# Patient Record
Sex: Female | Born: 1940 | Race: White | Hispanic: No | Smoking: Never smoker
Health system: Southern US, Community
[De-identification: ages and names within clinical notes are randomized; demographics above are authoritative.]

## PROBLEM LIST (undated history)

## (undated) DIAGNOSIS — N189 Chronic kidney disease, unspecified: Secondary | ICD-10-CM

## (undated) DIAGNOSIS — R0600 Dyspnea, unspecified: Secondary | ICD-10-CM

## (undated) DIAGNOSIS — Z87442 Personal history of urinary calculi: Secondary | ICD-10-CM

## (undated) DIAGNOSIS — C50919 Malignant neoplasm of unspecified site of unspecified female breast: Secondary | ICD-10-CM

## (undated) DIAGNOSIS — G459 Transient cerebral ischemic attack, unspecified: Secondary | ICD-10-CM

## (undated) DIAGNOSIS — K219 Gastro-esophageal reflux disease without esophagitis: Secondary | ICD-10-CM

## (undated) DIAGNOSIS — Z923 Personal history of irradiation: Secondary | ICD-10-CM

## (undated) DIAGNOSIS — A419 Sepsis, unspecified organism: Secondary | ICD-10-CM

## (undated) DIAGNOSIS — M1990 Unspecified osteoarthritis, unspecified site: Secondary | ICD-10-CM

## (undated) DIAGNOSIS — R600 Localized edema: Secondary | ICD-10-CM

## (undated) DIAGNOSIS — R0609 Other forms of dyspnea: Secondary | ICD-10-CM

## (undated) DIAGNOSIS — C801 Malignant (primary) neoplasm, unspecified: Secondary | ICD-10-CM

## (undated) DIAGNOSIS — I10 Essential (primary) hypertension: Secondary | ICD-10-CM

## (undated) HISTORY — PX: RENAL ARTERY STENT: SHX2321

## (undated) HISTORY — PX: CHOLECYSTECTOMY: SHX55

## (undated) HISTORY — PX: BREAST BIOPSY: SHX20

## (undated) HISTORY — PX: CYSTOSCOPY: SUR368

---

## 1997-09-04 ENCOUNTER — Other Ambulatory Visit: Admission: RE | Admit: 1997-09-04 | Discharge: 1997-09-04 | Payer: Self-pay | Admitting: Obstetrics and Gynecology

## 1998-10-01 ENCOUNTER — Other Ambulatory Visit: Admission: RE | Admit: 1998-10-01 | Discharge: 1998-10-01 | Payer: Self-pay | Admitting: Obstetrics and Gynecology

## 1999-11-04 ENCOUNTER — Other Ambulatory Visit: Admission: RE | Admit: 1999-11-04 | Discharge: 1999-11-04 | Payer: Self-pay | Admitting: Obstetrics and Gynecology

## 2000-04-13 DIAGNOSIS — Z923 Personal history of irradiation: Secondary | ICD-10-CM

## 2000-04-13 HISTORY — DX: Personal history of irradiation: Z92.3

## 2000-04-13 HISTORY — PX: BREAST LUMPECTOMY: SHX2

## 2000-09-15 ENCOUNTER — Encounter: Payer: Self-pay | Admitting: Obstetrics and Gynecology

## 2000-09-15 ENCOUNTER — Encounter: Admission: RE | Admit: 2000-09-15 | Discharge: 2000-09-15 | Payer: Self-pay | Admitting: Obstetrics and Gynecology

## 2000-09-20 ENCOUNTER — Encounter: Admission: RE | Admit: 2000-09-20 | Discharge: 2000-09-20 | Payer: Self-pay | Admitting: Obstetrics and Gynecology

## 2000-09-20 ENCOUNTER — Encounter (INDEPENDENT_AMBULATORY_CARE_PROVIDER_SITE_OTHER): Payer: Self-pay | Admitting: *Deleted

## 2000-09-20 ENCOUNTER — Encounter: Payer: Self-pay | Admitting: Obstetrics and Gynecology

## 2000-09-20 ENCOUNTER — Other Ambulatory Visit: Admission: RE | Admit: 2000-09-20 | Discharge: 2000-09-20 | Payer: Self-pay | Admitting: Obstetrics and Gynecology

## 2000-10-05 ENCOUNTER — Encounter: Payer: Self-pay | Admitting: General Surgery

## 2000-10-05 ENCOUNTER — Encounter: Admission: RE | Admit: 2000-10-05 | Discharge: 2000-10-05 | Payer: Self-pay | Admitting: General Surgery

## 2000-10-06 ENCOUNTER — Encounter (INDEPENDENT_AMBULATORY_CARE_PROVIDER_SITE_OTHER): Payer: Self-pay | Admitting: Specialist

## 2000-10-06 ENCOUNTER — Ambulatory Visit (HOSPITAL_BASED_OUTPATIENT_CLINIC_OR_DEPARTMENT_OTHER): Admission: RE | Admit: 2000-10-06 | Discharge: 2000-10-06 | Payer: Self-pay | Admitting: General Surgery

## 2000-10-06 ENCOUNTER — Encounter: Payer: Self-pay | Admitting: General Surgery

## 2000-10-06 ENCOUNTER — Encounter: Admission: RE | Admit: 2000-10-06 | Discharge: 2000-10-06 | Payer: Self-pay | Admitting: General Surgery

## 2000-10-15 ENCOUNTER — Ambulatory Visit: Admission: RE | Admit: 2000-10-15 | Discharge: 2001-01-13 | Payer: Self-pay | Admitting: Radiation Oncology

## 2001-02-08 ENCOUNTER — Ambulatory Visit: Admission: RE | Admit: 2001-02-08 | Discharge: 2001-05-09 | Payer: Self-pay | Admitting: Radiation Oncology

## 2001-02-21 ENCOUNTER — Other Ambulatory Visit: Admission: RE | Admit: 2001-02-21 | Discharge: 2001-02-21 | Payer: Self-pay

## 2001-03-29 ENCOUNTER — Encounter: Admission: RE | Admit: 2001-03-29 | Discharge: 2001-03-29 | Payer: Self-pay | Admitting: General Surgery

## 2001-03-29 ENCOUNTER — Encounter: Payer: Self-pay | Admitting: General Surgery

## 2001-09-27 ENCOUNTER — Encounter: Payer: Self-pay | Admitting: General Surgery

## 2001-09-27 ENCOUNTER — Encounter: Admission: RE | Admit: 2001-09-27 | Discharge: 2001-09-27 | Payer: Self-pay | Admitting: General Surgery

## 2002-03-14 ENCOUNTER — Other Ambulatory Visit: Admission: RE | Admit: 2002-03-14 | Discharge: 2002-03-14 | Payer: Self-pay | Admitting: Obstetrics and Gynecology

## 2002-10-02 ENCOUNTER — Encounter: Payer: Self-pay | Admitting: General Surgery

## 2002-10-02 ENCOUNTER — Encounter: Admission: RE | Admit: 2002-10-02 | Discharge: 2002-10-02 | Payer: Self-pay | Admitting: General Surgery

## 2003-03-20 ENCOUNTER — Other Ambulatory Visit: Admission: RE | Admit: 2003-03-20 | Discharge: 2003-03-20 | Payer: Self-pay | Admitting: Obstetrics and Gynecology

## 2003-10-04 ENCOUNTER — Encounter: Admission: RE | Admit: 2003-10-04 | Discharge: 2003-10-04 | Payer: Self-pay | Admitting: General Surgery

## 2003-10-10 ENCOUNTER — Ambulatory Visit (HOSPITAL_COMMUNITY): Admission: RE | Admit: 2003-10-10 | Discharge: 2003-10-10 | Payer: Self-pay | Admitting: Gastroenterology

## 2005-01-13 ENCOUNTER — Encounter: Admission: RE | Admit: 2005-01-13 | Discharge: 2005-01-13 | Payer: Self-pay | Admitting: Obstetrics and Gynecology

## 2005-01-14 ENCOUNTER — Other Ambulatory Visit: Admission: RE | Admit: 2005-01-14 | Discharge: 2005-01-14 | Payer: Self-pay | Admitting: Obstetrics and Gynecology

## 2005-02-09 ENCOUNTER — Emergency Department (HOSPITAL_COMMUNITY): Admission: EM | Admit: 2005-02-09 | Discharge: 2005-02-09 | Payer: Self-pay | Admitting: Emergency Medicine

## 2006-02-23 ENCOUNTER — Encounter: Admission: RE | Admit: 2006-02-23 | Discharge: 2006-02-23 | Payer: Self-pay | Admitting: Obstetrics and Gynecology

## 2007-03-30 ENCOUNTER — Encounter: Admission: RE | Admit: 2007-03-30 | Discharge: 2007-03-30 | Payer: Self-pay | Admitting: Obstetrics and Gynecology

## 2007-04-11 ENCOUNTER — Encounter: Admission: RE | Admit: 2007-04-11 | Discharge: 2007-04-11 | Payer: Self-pay | Admitting: Obstetrics and Gynecology

## 2008-04-23 ENCOUNTER — Encounter: Admission: RE | Admit: 2008-04-23 | Discharge: 2008-04-23 | Payer: Self-pay | Admitting: Obstetrics and Gynecology

## 2009-05-15 ENCOUNTER — Encounter: Admission: RE | Admit: 2009-05-15 | Discharge: 2009-05-15 | Payer: Self-pay | Admitting: Obstetrics and Gynecology

## 2010-08-29 NOTE — Op Note (Signed)
NAME:  Rebecca Porter, Rebecca Porter                        ACCOUNT NO.:  [id number]   MEDICAL RECORD NO.:  [id number]                   PATIENT TYPE:  AMB   LOCATION:  ENDO                                 FACILITY:  [facility]   PHYSICIAN:  Bernette Redbird, M.D.                DATE OF BIRTH:  Mar 18, 1941   DATE OF PROCEDURE:  10/10/2003  DATE OF DISCHARGE:                                 OPERATIVE REPORT   PROCEDURE:  Colonoscopy.   INDICATIONS FOR PROCEDURE:  Family history of colon cancer and prior history  of adenomatous polyp in this 70 year old female last colonoscoped by me  about seven years ago at which time a small adenoma was removed.   FINDINGS:  Normal examination to the cecum.   DESCRIPTION OF PROCEDURE:  The nature, purpose, and risks of the procedure  were familiar to the patient from prior examination and she provided written  consent.  Sedation was Fentanyl 80 mcg and Versed 8 mg IV without  arrhythmias or desaturation.  The Olympus adult video colonoscope was  advanced with some looping, overcome by having the patient in the supine  position and also using some external abdominal compression.  The cecum was  identified by clear visualization of the appendiceal orifice and pullback  was then performed.  The quality of the prep was excellent and it was felt  that all areas were well seen.   This was a normal examination.  No polyps, cancer, colitis, vascular  malformations, or diverticulosis was noted other than some very minimal  diverticular orifices in the sigmoid region.  Retroflexion in the rectum and  reinspection of the rectosigmoid was unremarkable.  No biopsies were  obtained. The patient tolerated the procedure well with no complications.   IMPRESSION:  Normal colonoscopy in a patient with a prior history of colon  polyps and a family history of colon cancer (V12.72).   PLAN:  Follow-up colonoscopy in five years.                                               Bernette Redbird, M.D.    RB/MEDQ  D:  10/10/2003  T:  10/10/2003  Job:  [id number]   cc:   L. Lupe Carney, M.D.  [address] [place]  [place] [address]  Fax: [phone]

## 2010-08-29 NOTE — Op Note (Signed)
[facility]  Patient:    Rebecca Porter, Rebecca Porter                      MRN: [id number] Adm. Date:  10/06/00 Attending:  Rose Phi. Maple Hudson, M.D. CC:         Juluis Mire, M.D.   Operative Report  PREOPERATIVE DIAGNOSIS:  Ductal carcinoma in situ of the left breast.  POSTOPERATIVE DIAGNOSIS:  Ductal carcinoma in situ of the left breast.  PROCEDURE:  Left partial mastectomy with needle localization and specimen mammography.  SURGEON:  Rose Phi. Maple Hudson, M.D.  ANESTHESIA:  General.  CLINICAL NOTE:  This patient had had a previously identified abnormality in the 12 oclock position of the left breast with microcalcifications.  Needle core biopsy showed high-grade DCIS, and she is scheduled now for a wide excision.  DESCRIPTION OF PROCEDURE:  Prior to coming to the operating room, a wire had been placed to identify the abnormal area.  After suitable general anesthesia was induced, the patient was placed in the supine position and the left breast prepped and draped in the usual fashion. A curvilinear incision was outlined around the previously-placed wire and an incision made and a wide excision carried out.  Specimen mammography confirmed the removal of the area with clip, and the specimen, which had been oriented for the pathologist, was submitted for touch prep evaluation for margins.  While that was being done, hemostasis obtained with the cautery.  Subcuticular closure of 4-0 Monocryl and Steri-Strips was carried out.  Touch prep on the margins showed them to be negative.  A dressing was applied and the patient transferred to the recovery room in satisfactory condition, having tolerated the procedure well. DD:  10/06/00 TD:  10/06/00 Job: [id number]

## 2010-09-18 ENCOUNTER — Other Ambulatory Visit: Payer: Self-pay | Admitting: Obstetrics and Gynecology

## 2010-09-18 DIAGNOSIS — N644 Mastodynia: Secondary | ICD-10-CM

## 2010-09-25 ENCOUNTER — Other Ambulatory Visit: Payer: Self-pay

## 2010-09-30 ENCOUNTER — Ambulatory Visit
Admission: RE | Admit: 2010-09-30 | Discharge: 2010-09-30 | Disposition: A | Discharge status: Home / Self Care | Payer: Medicare Other | Source: Ambulatory Visit | Attending: Obstetrics and Gynecology | Admitting: Obstetrics and Gynecology

## 2010-09-30 DIAGNOSIS — N644 Mastodynia: Secondary | ICD-10-CM

## 2012-06-24 ENCOUNTER — Other Ambulatory Visit: Payer: Self-pay

## 2012-06-24 DIAGNOSIS — Z1231 Encounter for screening mammogram for malignant neoplasm of breast: Secondary | ICD-10-CM

## 2012-07-28 ENCOUNTER — Ambulatory Visit
Admission: RE | Admit: 2012-07-28 | Discharge: 2012-07-28 | Disposition: A | Discharge status: Home / Self Care | Payer: Medicare Other | Source: Ambulatory Visit

## 2012-07-28 DIAGNOSIS — Z1231 Encounter for screening mammogram for malignant neoplasm of breast: Secondary | ICD-10-CM

## 2014-01-18 ENCOUNTER — Other Ambulatory Visit: Payer: Self-pay

## 2014-01-18 DIAGNOSIS — Z1231 Encounter for screening mammogram for malignant neoplasm of breast: Secondary | ICD-10-CM

## 2014-02-01 ENCOUNTER — Encounter (INDEPENDENT_AMBULATORY_CARE_PROVIDER_SITE_OTHER): Payer: Self-pay

## 2014-02-01 ENCOUNTER — Ambulatory Visit
Admission: RE | Admit: 2014-02-01 | Discharge: 2014-02-01 | Disposition: A | Discharge status: Home / Self Care | Payer: Medicare Other | Source: Ambulatory Visit

## 2014-02-01 DIAGNOSIS — Z1231 Encounter for screening mammogram for malignant neoplasm of breast: Secondary | ICD-10-CM

## 2014-08-17 ENCOUNTER — Other Ambulatory Visit: Payer: Self-pay | Admitting: Gastroenterology

## 2014-08-17 DIAGNOSIS — R1310 Dysphagia, unspecified: Secondary | ICD-10-CM

## 2014-08-30 ENCOUNTER — Ambulatory Visit
Admission: RE | Admit: 2014-08-30 | Discharge: 2014-08-30 | Disposition: A | Discharge status: Home / Self Care | Payer: Medicare Other | Source: Ambulatory Visit | Attending: Gastroenterology | Admitting: Gastroenterology

## 2014-08-30 DIAGNOSIS — R1310 Dysphagia, unspecified: Secondary | ICD-10-CM

## 2015-06-11 ENCOUNTER — Other Ambulatory Visit: Payer: Self-pay

## 2015-06-11 DIAGNOSIS — Z853 Personal history of malignant neoplasm of breast: Secondary | ICD-10-CM

## 2015-06-12 ENCOUNTER — Ambulatory Visit
Admission: RE | Admit: 2015-06-12 | Discharge: 2015-06-12 | Disposition: A | Discharge status: Home / Self Care | Payer: Medicare Other | Source: Ambulatory Visit

## 2015-06-12 DIAGNOSIS — Z853 Personal history of malignant neoplasm of breast: Secondary | ICD-10-CM

## 2016-07-23 ENCOUNTER — Other Ambulatory Visit: Payer: Self-pay | Admitting: Obstetrics and Gynecology

## 2016-07-23 DIAGNOSIS — Z1231 Encounter for screening mammogram for malignant neoplasm of breast: Secondary | ICD-10-CM

## 2016-08-20 ENCOUNTER — Ambulatory Visit
Admission: RE | Admit: 2016-08-20 | Discharge: 2016-08-20 | Disposition: A | Discharge status: Home / Self Care | Payer: Medicare Other | Source: Ambulatory Visit | Attending: Obstetrics and Gynecology | Admitting: Obstetrics and Gynecology

## 2016-08-20 DIAGNOSIS — Z1231 Encounter for screening mammogram for malignant neoplasm of breast: Secondary | ICD-10-CM

## 2016-08-20 HISTORY — DX: Personal history of irradiation: Z92.3

## 2016-09-10 DIAGNOSIS — Z6838 Body mass index (BMI) 38.0-38.9, adult: Secondary | ICD-10-CM | POA: Diagnosis not present

## 2016-09-10 DIAGNOSIS — Z01419 Encounter for gynecological examination (general) (routine) without abnormal findings: Secondary | ICD-10-CM | POA: Diagnosis not present

## 2016-09-10 DIAGNOSIS — N3281 Overactive bladder: Secondary | ICD-10-CM | POA: Diagnosis not present

## 2016-09-10 DIAGNOSIS — K118 Other diseases of salivary glands: Secondary | ICD-10-CM | POA: Diagnosis not present

## 2016-12-03 DIAGNOSIS — M25561 Pain in right knee: Secondary | ICD-10-CM | POA: Diagnosis not present

## 2016-12-03 DIAGNOSIS — G8929 Other chronic pain: Secondary | ICD-10-CM | POA: Diagnosis not present

## 2016-12-10 DIAGNOSIS — M25561 Pain in right knee: Secondary | ICD-10-CM | POA: Diagnosis not present

## 2016-12-10 DIAGNOSIS — G8929 Other chronic pain: Secondary | ICD-10-CM | POA: Diagnosis not present

## 2016-12-24 DIAGNOSIS — M25561 Pain in right knee: Secondary | ICD-10-CM | POA: Diagnosis not present

## 2016-12-24 DIAGNOSIS — G8929 Other chronic pain: Secondary | ICD-10-CM | POA: Diagnosis not present

## 2016-12-28 DIAGNOSIS — Z13220 Encounter for screening for lipoid disorders: Secondary | ICD-10-CM | POA: Diagnosis not present

## 2016-12-28 DIAGNOSIS — Z0000 Encounter for general adult medical examination without abnormal findings: Secondary | ICD-10-CM | POA: Diagnosis not present

## 2016-12-28 DIAGNOSIS — E559 Vitamin D deficiency, unspecified: Secondary | ICD-10-CM | POA: Diagnosis not present

## 2016-12-28 DIAGNOSIS — R799 Abnormal finding of blood chemistry, unspecified: Secondary | ICD-10-CM | POA: Diagnosis not present

## 2016-12-28 DIAGNOSIS — Z136 Encounter for screening for cardiovascular disorders: Secondary | ICD-10-CM | POA: Diagnosis not present

## 2016-12-28 DIAGNOSIS — Z1329 Encounter for screening for other suspected endocrine disorder: Secondary | ICD-10-CM | POA: Diagnosis not present

## 2016-12-28 DIAGNOSIS — N3941 Urge incontinence: Secondary | ICD-10-CM | POA: Diagnosis not present

## 2016-12-28 DIAGNOSIS — J302 Other seasonal allergic rhinitis: Secondary | ICD-10-CM | POA: Diagnosis not present

## 2016-12-28 DIAGNOSIS — R350 Frequency of micturition: Secondary | ICD-10-CM | POA: Diagnosis not present

## 2016-12-28 DIAGNOSIS — Z23 Encounter for immunization: Secondary | ICD-10-CM | POA: Diagnosis not present

## 2017-01-29 DIAGNOSIS — M25561 Pain in right knee: Secondary | ICD-10-CM | POA: Diagnosis not present

## 2017-01-29 DIAGNOSIS — G8929 Other chronic pain: Secondary | ICD-10-CM | POA: Diagnosis not present

## 2017-04-15 DIAGNOSIS — M1711 Unilateral primary osteoarthritis, right knee: Secondary | ICD-10-CM | POA: Diagnosis not present

## 2017-07-16 DIAGNOSIS — M1711 Unilateral primary osteoarthritis, right knee: Secondary | ICD-10-CM | POA: Insufficient documentation

## 2017-07-16 DIAGNOSIS — M179 Osteoarthritis of knee, unspecified: Secondary | ICD-10-CM | POA: Insufficient documentation

## 2017-07-23 ENCOUNTER — Other Ambulatory Visit: Payer: Self-pay | Admitting: Obstetrics and Gynecology

## 2017-07-23 DIAGNOSIS — Z139 Encounter for screening, unspecified: Secondary | ICD-10-CM

## 2017-08-23 ENCOUNTER — Ambulatory Visit
Admission: RE | Admit: 2017-08-23 | Discharge: 2017-08-23 | Disposition: A | Discharge status: Home / Self Care | Payer: Medicare Other | Source: Ambulatory Visit | Attending: Obstetrics and Gynecology | Admitting: Obstetrics and Gynecology

## 2017-08-23 DIAGNOSIS — Z139 Encounter for screening, unspecified: Secondary | ICD-10-CM

## 2017-08-23 DIAGNOSIS — Z1231 Encounter for screening mammogram for malignant neoplasm of breast: Secondary | ICD-10-CM | POA: Diagnosis not present

## 2017-09-24 DIAGNOSIS — M1711 Unilateral primary osteoarthritis, right knee: Secondary | ICD-10-CM | POA: Diagnosis not present

## 2017-09-29 DIAGNOSIS — Z124 Encounter for screening for malignant neoplasm of cervix: Secondary | ICD-10-CM | POA: Diagnosis not present

## 2017-09-29 DIAGNOSIS — Z6835 Body mass index (BMI) 35.0-35.9, adult: Secondary | ICD-10-CM | POA: Diagnosis not present

## 2017-10-04 DIAGNOSIS — I10 Essential (primary) hypertension: Secondary | ICD-10-CM | POA: Diagnosis not present

## 2017-10-18 DIAGNOSIS — I10 Essential (primary) hypertension: Secondary | ICD-10-CM | POA: Diagnosis not present

## 2017-11-06 DIAGNOSIS — D72829 Elevated white blood cell count, unspecified: Secondary | ICD-10-CM | POA: Diagnosis not present

## 2017-11-06 DIAGNOSIS — E860 Dehydration: Secondary | ICD-10-CM | POA: Diagnosis present

## 2017-11-06 DIAGNOSIS — D696 Thrombocytopenia, unspecified: Secondary | ICD-10-CM | POA: Diagnosis present

## 2017-11-06 DIAGNOSIS — M47896 Other spondylosis, lumbar region: Secondary | ICD-10-CM | POA: Diagnosis not present

## 2017-11-06 DIAGNOSIS — N170 Acute kidney failure with tubular necrosis: Secondary | ICD-10-CM | POA: Diagnosis not present

## 2017-11-06 DIAGNOSIS — M4310 Spondylolisthesis, site unspecified: Secondary | ICD-10-CM | POA: Insufficient documentation

## 2017-11-06 DIAGNOSIS — R945 Abnormal results of liver function studies: Secondary | ICD-10-CM | POA: Diagnosis not present

## 2017-11-06 DIAGNOSIS — N136 Pyonephrosis: Secondary | ICD-10-CM | POA: Diagnosis present

## 2017-11-06 DIAGNOSIS — E569 Vitamin deficiency, unspecified: Secondary | ICD-10-CM | POA: Diagnosis not present

## 2017-11-06 DIAGNOSIS — A419 Sepsis, unspecified organism: Secondary | ICD-10-CM | POA: Diagnosis not present

## 2017-11-06 DIAGNOSIS — N3001 Acute cystitis with hematuria: Secondary | ICD-10-CM | POA: Diagnosis not present

## 2017-11-06 DIAGNOSIS — N1330 Unspecified hydronephrosis: Secondary | ICD-10-CM | POA: Diagnosis not present

## 2017-11-06 DIAGNOSIS — M5136 Other intervertebral disc degeneration, lumbar region: Secondary | ICD-10-CM | POA: Diagnosis not present

## 2017-11-06 DIAGNOSIS — F329 Major depressive disorder, single episode, unspecified: Secondary | ICD-10-CM | POA: Diagnosis present

## 2017-11-06 DIAGNOSIS — Z792 Long term (current) use of antibiotics: Secondary | ICD-10-CM | POA: Diagnosis not present

## 2017-11-06 DIAGNOSIS — D649 Anemia, unspecified: Secondary | ICD-10-CM | POA: Diagnosis not present

## 2017-11-06 DIAGNOSIS — Z1612 Extended spectrum beta lactamase (ESBL) resistance: Secondary | ICD-10-CM | POA: Diagnosis not present

## 2017-11-06 DIAGNOSIS — M4306 Spondylolysis, lumbar region: Secondary | ICD-10-CM | POA: Diagnosis present

## 2017-11-06 DIAGNOSIS — R112 Nausea with vomiting, unspecified: Secondary | ICD-10-CM | POA: Diagnosis not present

## 2017-11-06 DIAGNOSIS — Z743 Need for continuous supervision: Secondary | ICD-10-CM | POA: Diagnosis not present

## 2017-11-06 DIAGNOSIS — M4316 Spondylolisthesis, lumbar region: Secondary | ICD-10-CM | POA: Diagnosis not present

## 2017-11-06 DIAGNOSIS — M4300 Spondylolysis, site unspecified: Secondary | ICD-10-CM | POA: Insufficient documentation

## 2017-11-06 DIAGNOSIS — E872 Acidosis: Secondary | ICD-10-CM | POA: Diagnosis present

## 2017-11-06 DIAGNOSIS — N3000 Acute cystitis without hematuria: Secondary | ICD-10-CM | POA: Diagnosis not present

## 2017-11-06 DIAGNOSIS — R509 Fever, unspecified: Secondary | ICD-10-CM | POA: Diagnosis not present

## 2017-11-06 DIAGNOSIS — Z79899 Other long term (current) drug therapy: Secondary | ICD-10-CM | POA: Diagnosis not present

## 2017-11-06 DIAGNOSIS — R918 Other nonspecific abnormal finding of lung field: Secondary | ICD-10-CM | POA: Diagnosis not present

## 2017-11-06 DIAGNOSIS — R52 Pain, unspecified: Secondary | ICD-10-CM | POA: Diagnosis not present

## 2017-11-06 DIAGNOSIS — N2889 Other specified disorders of kidney and ureter: Secondary | ICD-10-CM | POA: Diagnosis not present

## 2017-11-06 DIAGNOSIS — A4150 Gram-negative sepsis, unspecified: Secondary | ICD-10-CM | POA: Diagnosis not present

## 2017-11-06 DIAGNOSIS — Z880 Allergy status to penicillin: Secondary | ICD-10-CM | POA: Diagnosis not present

## 2017-11-06 DIAGNOSIS — Z889 Allergy status to unspecified drugs, medicaments and biological substances status: Secondary | ICD-10-CM | POA: Diagnosis not present

## 2017-11-06 DIAGNOSIS — R630 Anorexia: Secondary | ICD-10-CM | POA: Diagnosis present

## 2017-11-06 DIAGNOSIS — T7840XS Allergy, unspecified, sequela: Secondary | ICD-10-CM | POA: Diagnosis not present

## 2017-11-06 DIAGNOSIS — Z888 Allergy status to other drugs, medicaments and biological substances status: Secondary | ICD-10-CM | POA: Diagnosis not present

## 2017-11-06 DIAGNOSIS — Z6836 Body mass index (BMI) 36.0-36.9, adult: Secondary | ICD-10-CM | POA: Diagnosis not present

## 2017-11-06 DIAGNOSIS — J90 Pleural effusion, not elsewhere classified: Secondary | ICD-10-CM | POA: Diagnosis not present

## 2017-11-06 DIAGNOSIS — N201 Calculus of ureter: Secondary | ICD-10-CM | POA: Diagnosis not present

## 2017-11-06 DIAGNOSIS — Z111 Encounter for screening for respiratory tuberculosis: Secondary | ICD-10-CM | POA: Diagnosis not present

## 2017-11-06 DIAGNOSIS — F509 Eating disorder, unspecified: Secondary | ICD-10-CM | POA: Diagnosis not present

## 2017-11-06 DIAGNOSIS — N200 Calculus of kidney: Secondary | ICD-10-CM | POA: Diagnosis not present

## 2017-11-06 DIAGNOSIS — A4151 Sepsis due to Escherichia coli [E. coli]: Secondary | ICD-10-CM | POA: Diagnosis present

## 2017-11-06 DIAGNOSIS — E669 Obesity, unspecified: Secondary | ICD-10-CM | POA: Diagnosis present

## 2017-11-06 DIAGNOSIS — R030 Elevated blood-pressure reading, without diagnosis of hypertension: Secondary | ICD-10-CM | POA: Diagnosis not present

## 2017-11-06 DIAGNOSIS — N202 Calculus of kidney with calculus of ureter: Secondary | ICD-10-CM | POA: Diagnosis not present

## 2017-11-06 DIAGNOSIS — I517 Cardiomegaly: Secondary | ICD-10-CM | POA: Diagnosis not present

## 2017-11-06 DIAGNOSIS — N390 Urinary tract infection, site not specified: Secondary | ICD-10-CM | POA: Insufficient documentation

## 2017-11-06 DIAGNOSIS — N3281 Overactive bladder: Secondary | ICD-10-CM | POA: Diagnosis present

## 2017-11-06 DIAGNOSIS — K5900 Constipation, unspecified: Secondary | ICD-10-CM | POA: Diagnosis not present

## 2017-11-06 DIAGNOSIS — M6281 Muscle weakness (generalized): Secondary | ICD-10-CM | POA: Diagnosis not present

## 2017-11-06 DIAGNOSIS — N132 Hydronephrosis with renal and ureteral calculous obstruction: Secondary | ICD-10-CM | POA: Diagnosis not present

## 2017-11-06 DIAGNOSIS — R6521 Severe sepsis with septic shock: Secondary | ICD-10-CM | POA: Diagnosis present

## 2017-11-06 DIAGNOSIS — R7989 Other specified abnormal findings of blood chemistry: Secondary | ICD-10-CM | POA: Insufficient documentation

## 2017-11-06 DIAGNOSIS — F39 Unspecified mood [affective] disorder: Secondary | ICD-10-CM | POA: Diagnosis not present

## 2017-11-06 DIAGNOSIS — Z466 Encounter for fitting and adjustment of urinary device: Secondary | ICD-10-CM | POA: Diagnosis not present

## 2017-11-06 DIAGNOSIS — I700 Atherosclerosis of aorta: Secondary | ICD-10-CM | POA: Diagnosis not present

## 2017-11-06 DIAGNOSIS — Z9049 Acquired absence of other specified parts of digestive tract: Secondary | ICD-10-CM | POA: Diagnosis not present

## 2017-11-06 DIAGNOSIS — R2689 Other abnormalities of gait and mobility: Secondary | ICD-10-CM | POA: Diagnosis not present

## 2017-11-06 DIAGNOSIS — N179 Acute kidney failure, unspecified: Secondary | ICD-10-CM | POA: Diagnosis present

## 2017-11-06 DIAGNOSIS — Z881 Allergy status to other antibiotic agents status: Secondary | ICD-10-CM | POA: Diagnosis not present

## 2017-11-07 DIAGNOSIS — D696 Thrombocytopenia, unspecified: Secondary | ICD-10-CM | POA: Insufficient documentation

## 2017-11-12 DIAGNOSIS — R52 Pain, unspecified: Secondary | ICD-10-CM | POA: Diagnosis not present

## 2017-11-12 DIAGNOSIS — M5489 Other dorsalgia: Secondary | ICD-10-CM | POA: Diagnosis not present

## 2017-11-12 DIAGNOSIS — R54 Age-related physical debility: Secondary | ICD-10-CM | POA: Diagnosis not present

## 2017-11-12 DIAGNOSIS — Z880 Allergy status to penicillin: Secondary | ICD-10-CM | POA: Diagnosis not present

## 2017-11-12 DIAGNOSIS — D649 Anemia, unspecified: Secondary | ICD-10-CM | POA: Diagnosis present

## 2017-11-12 DIAGNOSIS — Z853 Personal history of malignant neoplasm of breast: Secondary | ICD-10-CM | POA: Diagnosis not present

## 2017-11-12 DIAGNOSIS — F4323 Adjustment disorder with mixed anxiety and depressed mood: Secondary | ICD-10-CM | POA: Diagnosis not present

## 2017-11-12 DIAGNOSIS — Z743 Need for continuous supervision: Secondary | ICD-10-CM | POA: Diagnosis not present

## 2017-11-12 DIAGNOSIS — R7881 Bacteremia: Secondary | ICD-10-CM | POA: Diagnosis not present

## 2017-11-12 DIAGNOSIS — Z881 Allergy status to other antibiotic agents status: Secondary | ICD-10-CM | POA: Diagnosis not present

## 2017-11-12 DIAGNOSIS — D72829 Elevated white blood cell count, unspecified: Secondary | ICD-10-CM | POA: Diagnosis not present

## 2017-11-12 DIAGNOSIS — N181 Chronic kidney disease, stage 1: Secondary | ICD-10-CM | POA: Diagnosis present

## 2017-11-12 DIAGNOSIS — Z87440 Personal history of urinary (tract) infections: Secondary | ICD-10-CM | POA: Diagnosis not present

## 2017-11-12 DIAGNOSIS — N390 Urinary tract infection, site not specified: Secondary | ICD-10-CM | POA: Diagnosis not present

## 2017-11-12 DIAGNOSIS — N3281 Overactive bladder: Secondary | ICD-10-CM | POA: Diagnosis not present

## 2017-11-12 DIAGNOSIS — E569 Vitamin deficiency, unspecified: Secondary | ICD-10-CM | POA: Diagnosis not present

## 2017-11-12 DIAGNOSIS — R32 Unspecified urinary incontinence: Secondary | ICD-10-CM | POA: Diagnosis present

## 2017-11-12 DIAGNOSIS — T7840XS Allergy, unspecified, sequela: Secondary | ICD-10-CM | POA: Diagnosis not present

## 2017-11-12 DIAGNOSIS — N200 Calculus of kidney: Secondary | ICD-10-CM | POA: Diagnosis not present

## 2017-11-12 DIAGNOSIS — N179 Acute kidney failure, unspecified: Secondary | ICD-10-CM | POA: Diagnosis not present

## 2017-11-12 DIAGNOSIS — F39 Unspecified mood [affective] disorder: Secondary | ICD-10-CM | POA: Diagnosis not present

## 2017-11-12 DIAGNOSIS — M4647 Discitis, unspecified, lumbosacral region: Secondary | ICD-10-CM | POA: Diagnosis present

## 2017-11-12 DIAGNOSIS — M4310 Spondylolisthesis, site unspecified: Secondary | ICD-10-CM | POA: Diagnosis not present

## 2017-11-12 DIAGNOSIS — B356 Tinea cruris: Secondary | ICD-10-CM | POA: Diagnosis not present

## 2017-11-12 DIAGNOSIS — N209 Urinary calculus, unspecified: Secondary | ICD-10-CM | POA: Diagnosis not present

## 2017-11-12 DIAGNOSIS — M6281 Muscle weakness (generalized): Secondary | ICD-10-CM | POA: Diagnosis not present

## 2017-11-12 DIAGNOSIS — G062 Extradural and subdural abscess, unspecified: Secondary | ICD-10-CM | POA: Diagnosis not present

## 2017-11-12 DIAGNOSIS — M545 Low back pain: Secondary | ICD-10-CM | POA: Diagnosis not present

## 2017-11-12 DIAGNOSIS — R630 Anorexia: Secondary | ICD-10-CM | POA: Diagnosis not present

## 2017-11-12 DIAGNOSIS — R918 Other nonspecific abnormal finding of lung field: Secondary | ICD-10-CM | POA: Diagnosis not present

## 2017-11-12 DIAGNOSIS — R945 Abnormal results of liver function studies: Secondary | ICD-10-CM | POA: Diagnosis not present

## 2017-11-12 DIAGNOSIS — G061 Intraspinal abscess and granuloma: Secondary | ICD-10-CM | POA: Diagnosis present

## 2017-11-12 DIAGNOSIS — F509 Eating disorder, unspecified: Secondary | ICD-10-CM | POA: Diagnosis not present

## 2017-11-12 DIAGNOSIS — Z889 Allergy status to unspecified drugs, medicaments and biological substances status: Secondary | ICD-10-CM | POA: Diagnosis not present

## 2017-11-12 DIAGNOSIS — R319 Hematuria, unspecified: Secondary | ICD-10-CM | POA: Diagnosis present

## 2017-11-12 DIAGNOSIS — Z888 Allergy status to other drugs, medicaments and biological substances status: Secondary | ICD-10-CM | POA: Diagnosis not present

## 2017-11-12 DIAGNOSIS — E669 Obesity, unspecified: Secondary | ICD-10-CM | POA: Diagnosis present

## 2017-11-12 DIAGNOSIS — M4316 Spondylolisthesis, lumbar region: Secondary | ICD-10-CM | POA: Diagnosis not present

## 2017-11-12 DIAGNOSIS — A419 Sepsis, unspecified organism: Secondary | ICD-10-CM | POA: Diagnosis not present

## 2017-11-12 DIAGNOSIS — M4626 Osteomyelitis of vertebra, lumbar region: Secondary | ICD-10-CM | POA: Diagnosis not present

## 2017-11-12 DIAGNOSIS — R112 Nausea with vomiting, unspecified: Secondary | ICD-10-CM | POA: Diagnosis not present

## 2017-11-12 DIAGNOSIS — Z95828 Presence of other vascular implants and grafts: Secondary | ICD-10-CM | POA: Diagnosis not present

## 2017-11-12 DIAGNOSIS — R509 Fever, unspecified: Secondary | ICD-10-CM | POA: Diagnosis not present

## 2017-11-12 DIAGNOSIS — R2689 Other abnormalities of gait and mobility: Secondary | ICD-10-CM | POA: Diagnosis not present

## 2017-11-12 DIAGNOSIS — F320 Major depressive disorder, single episode, mild: Secondary | ICD-10-CM | POA: Diagnosis not present

## 2017-11-12 DIAGNOSIS — M5136 Other intervertebral disc degeneration, lumbar region: Secondary | ICD-10-CM | POA: Diagnosis not present

## 2017-11-12 DIAGNOSIS — D696 Thrombocytopenia, unspecified: Secondary | ICD-10-CM | POA: Diagnosis not present

## 2017-11-12 DIAGNOSIS — K5900 Constipation, unspecified: Secondary | ICD-10-CM | POA: Diagnosis present

## 2017-11-12 DIAGNOSIS — R030 Elevated blood-pressure reading, without diagnosis of hypertension: Secondary | ICD-10-CM | POA: Diagnosis not present

## 2017-11-12 DIAGNOSIS — B373 Candidiasis of vulva and vagina: Secondary | ICD-10-CM | POA: Diagnosis not present

## 2017-11-12 DIAGNOSIS — F319 Bipolar disorder, unspecified: Secondary | ICD-10-CM | POA: Diagnosis present

## 2017-11-12 DIAGNOSIS — Z111 Encounter for screening for respiratory tuberculosis: Secondary | ICD-10-CM | POA: Diagnosis not present

## 2017-11-12 DIAGNOSIS — M4627 Osteomyelitis of vertebra, lumbosacral region: Secondary | ICD-10-CM | POA: Diagnosis present

## 2017-11-12 DIAGNOSIS — Z452 Encounter for adjustment and management of vascular access device: Secondary | ICD-10-CM | POA: Diagnosis not present

## 2017-11-12 DIAGNOSIS — B9629 Other Escherichia coli [E. coli] as the cause of diseases classified elsewhere: Secondary | ICD-10-CM | POA: Diagnosis not present

## 2017-11-12 DIAGNOSIS — Z87442 Personal history of urinary calculi: Secondary | ICD-10-CM | POA: Diagnosis not present

## 2017-11-12 DIAGNOSIS — Z1612 Extended spectrum beta lactamase (ESBL) resistance: Secondary | ICD-10-CM | POA: Diagnosis not present

## 2017-11-12 DIAGNOSIS — I700 Atherosclerosis of aorta: Secondary | ICD-10-CM | POA: Diagnosis not present

## 2017-11-12 DIAGNOSIS — J90 Pleural effusion, not elsewhere classified: Secondary | ICD-10-CM | POA: Diagnosis not present

## 2017-11-15 DIAGNOSIS — R54 Age-related physical debility: Secondary | ICD-10-CM | POA: Diagnosis not present

## 2017-11-15 DIAGNOSIS — R52 Pain, unspecified: Secondary | ICD-10-CM | POA: Diagnosis not present

## 2017-11-15 DIAGNOSIS — R509 Fever, unspecified: Secondary | ICD-10-CM | POA: Diagnosis not present

## 2017-11-15 DIAGNOSIS — R030 Elevated blood-pressure reading, without diagnosis of hypertension: Secondary | ICD-10-CM | POA: Diagnosis not present

## 2017-11-15 DIAGNOSIS — N179 Acute kidney failure, unspecified: Secondary | ICD-10-CM | POA: Diagnosis not present

## 2017-11-15 DIAGNOSIS — N209 Urinary calculus, unspecified: Secondary | ICD-10-CM | POA: Diagnosis not present

## 2017-11-15 DIAGNOSIS — M6281 Muscle weakness (generalized): Secondary | ICD-10-CM | POA: Diagnosis not present

## 2017-11-15 DIAGNOSIS — D72829 Elevated white blood cell count, unspecified: Secondary | ICD-10-CM | POA: Diagnosis not present

## 2017-11-15 DIAGNOSIS — M4316 Spondylolisthesis, lumbar region: Secondary | ICD-10-CM | POA: Diagnosis not present

## 2017-11-15 DIAGNOSIS — N390 Urinary tract infection, site not specified: Secondary | ICD-10-CM | POA: Diagnosis not present

## 2017-11-15 DIAGNOSIS — D696 Thrombocytopenia, unspecified: Secondary | ICD-10-CM | POA: Diagnosis not present

## 2017-11-15 DIAGNOSIS — R945 Abnormal results of liver function studies: Secondary | ICD-10-CM | POA: Diagnosis not present

## 2017-11-17 DIAGNOSIS — N179 Acute kidney failure, unspecified: Secondary | ICD-10-CM | POA: Diagnosis not present

## 2017-11-17 DIAGNOSIS — D696 Thrombocytopenia, unspecified: Secondary | ICD-10-CM | POA: Diagnosis not present

## 2017-11-17 DIAGNOSIS — M4316 Spondylolisthesis, lumbar region: Secondary | ICD-10-CM | POA: Diagnosis not present

## 2017-11-17 DIAGNOSIS — M6281 Muscle weakness (generalized): Secondary | ICD-10-CM | POA: Diagnosis not present

## 2017-11-17 DIAGNOSIS — N209 Urinary calculus, unspecified: Secondary | ICD-10-CM | POA: Diagnosis not present

## 2017-11-17 DIAGNOSIS — K5900 Constipation, unspecified: Secondary | ICD-10-CM | POA: Diagnosis not present

## 2017-11-17 DIAGNOSIS — R945 Abnormal results of liver function studies: Secondary | ICD-10-CM | POA: Diagnosis not present

## 2017-11-17 DIAGNOSIS — A419 Sepsis, unspecified organism: Secondary | ICD-10-CM | POA: Diagnosis not present

## 2017-11-17 DIAGNOSIS — N390 Urinary tract infection, site not specified: Secondary | ICD-10-CM | POA: Diagnosis not present

## 2017-11-17 DIAGNOSIS — R030 Elevated blood-pressure reading, without diagnosis of hypertension: Secondary | ICD-10-CM | POA: Diagnosis not present

## 2017-11-17 DIAGNOSIS — R52 Pain, unspecified: Secondary | ICD-10-CM | POA: Diagnosis not present

## 2017-11-17 DIAGNOSIS — N3281 Overactive bladder: Secondary | ICD-10-CM | POA: Diagnosis not present

## 2017-11-18 DIAGNOSIS — R52 Pain, unspecified: Secondary | ICD-10-CM | POA: Diagnosis not present

## 2017-11-18 DIAGNOSIS — M4316 Spondylolisthesis, lumbar region: Secondary | ICD-10-CM | POA: Diagnosis not present

## 2017-11-19 DIAGNOSIS — N390 Urinary tract infection, site not specified: Secondary | ICD-10-CM | POA: Diagnosis not present

## 2017-11-19 DIAGNOSIS — M4316 Spondylolisthesis, lumbar region: Secondary | ICD-10-CM | POA: Diagnosis not present

## 2017-11-19 DIAGNOSIS — R54 Age-related physical debility: Secondary | ICD-10-CM | POA: Diagnosis not present

## 2017-11-19 DIAGNOSIS — A419 Sepsis, unspecified organism: Secondary | ICD-10-CM | POA: Diagnosis not present

## 2017-11-19 DIAGNOSIS — R945 Abnormal results of liver function studies: Secondary | ICD-10-CM | POA: Diagnosis not present

## 2017-11-19 DIAGNOSIS — N209 Urinary calculus, unspecified: Secondary | ICD-10-CM | POA: Diagnosis not present

## 2017-11-19 DIAGNOSIS — D696 Thrombocytopenia, unspecified: Secondary | ICD-10-CM | POA: Diagnosis not present

## 2017-11-19 DIAGNOSIS — K5900 Constipation, unspecified: Secondary | ICD-10-CM | POA: Diagnosis not present

## 2017-11-19 DIAGNOSIS — N179 Acute kidney failure, unspecified: Secondary | ICD-10-CM | POA: Diagnosis not present

## 2017-11-19 DIAGNOSIS — R52 Pain, unspecified: Secondary | ICD-10-CM | POA: Diagnosis not present

## 2017-11-19 DIAGNOSIS — M6281 Muscle weakness (generalized): Secondary | ICD-10-CM | POA: Diagnosis not present

## 2017-11-19 DIAGNOSIS — R030 Elevated blood-pressure reading, without diagnosis of hypertension: Secondary | ICD-10-CM | POA: Diagnosis not present

## 2017-11-22 DIAGNOSIS — R630 Anorexia: Secondary | ICD-10-CM | POA: Diagnosis not present

## 2017-11-22 DIAGNOSIS — N390 Urinary tract infection, site not specified: Secondary | ICD-10-CM | POA: Diagnosis not present

## 2017-11-22 DIAGNOSIS — N179 Acute kidney failure, unspecified: Secondary | ICD-10-CM | POA: Diagnosis not present

## 2017-11-22 DIAGNOSIS — K5900 Constipation, unspecified: Secondary | ICD-10-CM | POA: Diagnosis not present

## 2017-11-22 DIAGNOSIS — N209 Urinary calculus, unspecified: Secondary | ICD-10-CM | POA: Diagnosis not present

## 2017-11-22 DIAGNOSIS — A419 Sepsis, unspecified organism: Secondary | ICD-10-CM | POA: Diagnosis not present

## 2017-11-22 DIAGNOSIS — M4316 Spondylolisthesis, lumbar region: Secondary | ICD-10-CM | POA: Diagnosis not present

## 2017-11-22 DIAGNOSIS — R52 Pain, unspecified: Secondary | ICD-10-CM | POA: Diagnosis not present

## 2017-11-22 DIAGNOSIS — R54 Age-related physical debility: Secondary | ICD-10-CM | POA: Diagnosis not present

## 2017-11-22 DIAGNOSIS — R945 Abnormal results of liver function studies: Secondary | ICD-10-CM | POA: Diagnosis not present

## 2017-11-22 DIAGNOSIS — R030 Elevated blood-pressure reading, without diagnosis of hypertension: Secondary | ICD-10-CM | POA: Diagnosis not present

## 2017-11-22 DIAGNOSIS — M6281 Muscle weakness (generalized): Secondary | ICD-10-CM | POA: Diagnosis not present

## 2017-11-24 DIAGNOSIS — M5136 Other intervertebral disc degeneration, lumbar region: Secondary | ICD-10-CM | POA: Diagnosis not present

## 2017-11-24 DIAGNOSIS — B373 Candidiasis of vulva and vagina: Secondary | ICD-10-CM | POA: Diagnosis not present

## 2017-11-24 DIAGNOSIS — M545 Low back pain: Secondary | ICD-10-CM | POA: Diagnosis not present

## 2017-11-24 DIAGNOSIS — M549 Dorsalgia, unspecified: Secondary | ICD-10-CM | POA: Insufficient documentation

## 2017-11-24 DIAGNOSIS — B356 Tinea cruris: Secondary | ICD-10-CM | POA: Diagnosis not present

## 2017-11-24 DIAGNOSIS — M4310 Spondylolisthesis, site unspecified: Secondary | ICD-10-CM | POA: Diagnosis not present

## 2017-11-24 DIAGNOSIS — D696 Thrombocytopenia, unspecified: Secondary | ICD-10-CM | POA: Diagnosis not present

## 2017-11-25 DIAGNOSIS — M5136 Other intervertebral disc degeneration, lumbar region: Secondary | ICD-10-CM | POA: Diagnosis not present

## 2017-11-26 ENCOUNTER — Other Ambulatory Visit: Payer: Self-pay

## 2017-11-26 ENCOUNTER — Emergency Department (HOSPITAL_COMMUNITY): Payer: Medicare Other

## 2017-11-26 ENCOUNTER — Encounter (HOSPITAL_COMMUNITY): Payer: Self-pay

## 2017-11-26 ENCOUNTER — Inpatient Hospital Stay (HOSPITAL_COMMUNITY)
Admission: EM | Admit: 2017-11-26 | Discharge: 2017-12-01 | DRG: 095 | Disposition: A | Discharge status: Skilled Nursing Facility | Payer: Medicare Other | Source: Skilled Nursing Facility | Attending: Family Medicine | Admitting: Family Medicine

## 2017-11-26 DIAGNOSIS — M5127 Other intervertebral disc displacement, lumbosacral region: Secondary | ICD-10-CM | POA: Diagnosis not present

## 2017-11-26 DIAGNOSIS — R2689 Other abnormalities of gait and mobility: Secondary | ICD-10-CM | POA: Diagnosis not present

## 2017-11-26 DIAGNOSIS — G061 Intraspinal abscess and granuloma: Principal | ICD-10-CM | POA: Diagnosis present

## 2017-11-26 DIAGNOSIS — M4627 Osteomyelitis of vertebra, lumbosacral region: Secondary | ICD-10-CM | POA: Diagnosis present

## 2017-11-26 DIAGNOSIS — Z880 Allergy status to penicillin: Secondary | ICD-10-CM

## 2017-11-26 DIAGNOSIS — N390 Urinary tract infection, site not specified: Secondary | ICD-10-CM

## 2017-11-26 DIAGNOSIS — G062 Extradural and subdural abscess, unspecified: Secondary | ICD-10-CM | POA: Diagnosis not present

## 2017-11-26 DIAGNOSIS — F329 Major depressive disorder, single episode, unspecified: Secondary | ICD-10-CM | POA: Diagnosis not present

## 2017-11-26 DIAGNOSIS — F319 Bipolar disorder, unspecified: Secondary | ICD-10-CM | POA: Diagnosis present

## 2017-11-26 DIAGNOSIS — M4316 Spondylolisthesis, lumbar region: Secondary | ICD-10-CM | POA: Diagnosis not present

## 2017-11-26 DIAGNOSIS — R112 Nausea with vomiting, unspecified: Secondary | ICD-10-CM | POA: Diagnosis not present

## 2017-11-26 DIAGNOSIS — E669 Obesity, unspecified: Secondary | ICD-10-CM | POA: Diagnosis present

## 2017-11-26 DIAGNOSIS — R52 Pain, unspecified: Secondary | ICD-10-CM | POA: Diagnosis not present

## 2017-11-26 DIAGNOSIS — M5489 Other dorsalgia: Secondary | ICD-10-CM | POA: Diagnosis not present

## 2017-11-26 DIAGNOSIS — Z95828 Presence of other vascular implants and grafts: Secondary | ICD-10-CM | POA: Diagnosis not present

## 2017-11-26 DIAGNOSIS — M4646 Discitis, unspecified, lumbar region: Secondary | ICD-10-CM | POA: Diagnosis not present

## 2017-11-26 DIAGNOSIS — Z881 Allergy status to other antibiotic agents status: Secondary | ICD-10-CM | POA: Diagnosis not present

## 2017-11-26 DIAGNOSIS — Z87440 Personal history of urinary (tract) infections: Secondary | ICD-10-CM | POA: Diagnosis not present

## 2017-11-26 DIAGNOSIS — R3981 Functional urinary incontinence: Secondary | ICD-10-CM | POA: Diagnosis not present

## 2017-11-26 DIAGNOSIS — M4626 Osteomyelitis of vertebra, lumbar region: Secondary | ICD-10-CM | POA: Diagnosis not present

## 2017-11-26 DIAGNOSIS — Z452 Encounter for adjustment and management of vascular access device: Secondary | ICD-10-CM | POA: Diagnosis not present

## 2017-11-26 DIAGNOSIS — M6281 Muscle weakness (generalized): Secondary | ICD-10-CM | POA: Diagnosis not present

## 2017-11-26 DIAGNOSIS — R32 Unspecified urinary incontinence: Secondary | ICD-10-CM | POA: Diagnosis present

## 2017-11-26 DIAGNOSIS — N189 Chronic kidney disease, unspecified: Secondary | ICD-10-CM | POA: Diagnosis not present

## 2017-11-26 DIAGNOSIS — N181 Chronic kidney disease, stage 1: Secondary | ICD-10-CM | POA: Diagnosis present

## 2017-11-26 DIAGNOSIS — M869 Osteomyelitis, unspecified: Secondary | ICD-10-CM

## 2017-11-26 DIAGNOSIS — M4647 Discitis, unspecified, lumbosacral region: Secondary | ICD-10-CM

## 2017-11-26 DIAGNOSIS — B9629 Other Escherichia coli [E. coli] as the cause of diseases classified elsewhere: Secondary | ICD-10-CM | POA: Diagnosis not present

## 2017-11-26 DIAGNOSIS — Z889 Allergy status to unspecified drugs, medicaments and biological substances status: Secondary | ICD-10-CM

## 2017-11-26 DIAGNOSIS — Z111 Encounter for screening for respiratory tuberculosis: Secondary | ICD-10-CM | POA: Diagnosis not present

## 2017-11-26 DIAGNOSIS — D649 Anemia, unspecified: Secondary | ICD-10-CM | POA: Diagnosis present

## 2017-11-26 DIAGNOSIS — Z87442 Personal history of urinary calculi: Secondary | ICD-10-CM | POA: Diagnosis not present

## 2017-11-26 DIAGNOSIS — K5900 Constipation, unspecified: Secondary | ICD-10-CM | POA: Diagnosis present

## 2017-11-26 DIAGNOSIS — M545 Low back pain: Secondary | ICD-10-CM | POA: Diagnosis not present

## 2017-11-26 DIAGNOSIS — R7881 Bacteremia: Secondary | ICD-10-CM | POA: Diagnosis not present

## 2017-11-26 DIAGNOSIS — R319 Hematuria, unspecified: Secondary | ICD-10-CM | POA: Diagnosis present

## 2017-11-26 DIAGNOSIS — N179 Acute kidney failure, unspecified: Secondary | ICD-10-CM | POA: Diagnosis not present

## 2017-11-26 DIAGNOSIS — Z1612 Extended spectrum beta lactamase (ESBL) resistance: Secondary | ICD-10-CM

## 2017-11-26 DIAGNOSIS — Z888 Allergy status to other drugs, medicaments and biological substances status: Secondary | ICD-10-CM | POA: Diagnosis not present

## 2017-11-26 DIAGNOSIS — Z853 Personal history of malignant neoplasm of breast: Secondary | ICD-10-CM | POA: Diagnosis not present

## 2017-11-26 LAB — CBC WITH DIFFERENTIAL/PLATELET
BASOS PCT: 1 %
Basophils Absolute: 0.1 10*3/uL (ref 0.0–0.1)
EOS ABS: 0.2 10*3/uL (ref 0.0–0.7)
Eosinophils Relative: 2 %
HEMATOCRIT: 30.2 % — AB (ref 36.0–46.0)
Hemoglobin: 9.7 g/dL — ABNORMAL LOW (ref 12.0–15.0)
Lymphocytes Relative: 18 %
Lymphs Abs: 2.1 10*3/uL (ref 0.7–4.0)
MCH: 30.3 pg (ref 26.0–34.0)
MCHC: 32.1 g/dL (ref 30.0–36.0)
MCV: 94.4 fL (ref 78.0–100.0)
MONOS PCT: 9 %
Monocytes Absolute: 1 10*3/uL (ref 0.1–1.0)
NEUTROS PCT: 70 %
Neutro Abs: 8.1 10*3/uL — ABNORMAL HIGH (ref 1.7–7.7)
PLATELETS: 237 10*3/uL (ref 150–400)
RBC: 3.2 MIL/uL — ABNORMAL LOW (ref 3.87–5.11)
RDW: 15.9 % — AB (ref 11.5–15.5)
WBC: 11.5 10*3/uL — ABNORMAL HIGH (ref 4.0–10.5)

## 2017-11-26 LAB — COMPREHENSIVE METABOLIC PANEL
ALBUMIN: 2.4 g/dL — AB (ref 3.5–5.0)
ALK PHOS: 145 U/L — AB (ref 38–126)
ALT: 21 U/L (ref 0–44)
ANION GAP: 6 (ref 5–15)
AST: 16 U/L (ref 15–41)
BUN: 27 mg/dL — ABNORMAL HIGH (ref 8–23)
CALCIUM: 9.1 mg/dL (ref 8.9–10.3)
CHLORIDE: 108 mmol/L (ref 98–111)
CO2: 25 mmol/L (ref 22–32)
Creatinine, Ser: 0.77 mg/dL (ref 0.44–1.00)
GFR calc non Af Amer: >60 mL/min (ref >60)
GLUCOSE: 100 mg/dL — AB (ref 70–99)
POTASSIUM: 4.3 mmol/L (ref 3.5–5.1)
SODIUM: 139 mmol/L (ref 135–145)
Total Bilirubin: 0.6 mg/dL (ref 0.3–1.2)
Total Protein: 6 g/dL — ABNORMAL LOW (ref 6.5–8.1)

## 2017-11-26 MED ORDER — GLUCERNA SHAKE PO LIQD
120.0000 mL | Freq: Three times a day (TID) | ORAL | Status: DC
  Administered 2017-11-27 – 2017-12-01 (×11): 120 mL via ORAL

## 2017-11-26 MED ORDER — GABAPENTIN 100 MG PO CAPS
100.0000 mg | ORAL_CAPSULE | Freq: Three times a day (TID) | ORAL | Status: DC
  Administered 2017-11-26 – 2017-12-01 (×14): 100 mg via ORAL
  Filled 2017-11-26 (×14): qty 1

## 2017-11-26 MED ORDER — SODIUM CHLORIDE 0.9 % IV SOLN: 250.0000 mL | INTRAVENOUS | Status: DC | PRN

## 2017-11-26 MED ORDER — KETOROLAC TROMETHAMINE 30 MG/ML IJ SOLN: 30.0000 mg | Freq: Four times a day (QID) | INTRAMUSCULAR | Status: DC | PRN

## 2017-11-26 MED ORDER — OXYCODONE HCL ER 10 MG PO T12A
10.0000 mg | EXTENDED_RELEASE_TABLET | Freq: Two times a day (BID) | ORAL | Status: DC
  Administered 2017-11-26 – 2017-12-01 (×9): 10 mg via ORAL
  Filled 2017-11-26 (×11): qty 1

## 2017-11-26 MED ORDER — MIRTAZAPINE 15 MG PO TABS
15.0000 mg | ORAL_TABLET | Freq: Every day | ORAL | Status: DC
  Filled 2017-11-26 (×3): qty 1

## 2017-11-26 MED ORDER — MEGESTROL ACETATE 400 MG/10ML PO SUSP
800.0000 mg | Freq: Every day | ORAL | Status: DC
  Administered 2017-11-28: 800 mg via ORAL
  Filled 2017-11-26 (×3): qty 20

## 2017-11-26 MED ORDER — LORATADINE 10 MG PO TABS
10.0000 mg | ORAL_TABLET | Freq: Every day | ORAL | Status: DC
  Administered 2017-11-28 – 2017-11-29 (×2): 10 mg via ORAL
  Filled 2017-11-26 (×4): qty 1

## 2017-11-26 MED ORDER — ONDANSETRON HCL 4 MG PO TABS: 4.0000 mg | ORAL_TABLET | Freq: Four times a day (QID) | ORAL | Status: DC | PRN

## 2017-11-26 MED ORDER — SODIUM CHLORIDE 0.9% FLUSH: 3.0000 mL | INTRAVENOUS | Status: DC | PRN

## 2017-11-26 MED ORDER — ESCITALOPRAM OXALATE 10 MG PO TABS
10.0000 mg | ORAL_TABLET | Freq: Every day | ORAL | Status: DC
  Administered 2017-11-28 – 2017-11-29 (×2): 10 mg via ORAL
  Filled 2017-11-26 (×4): qty 1

## 2017-11-26 MED ORDER — OXYBUTYNIN CHLORIDE ER 15 MG PO TB24
15.0000 mg | ORAL_TABLET | Freq: Every day | ORAL | Status: DC
  Administered 2017-11-28: 15 mg via ORAL
  Filled 2017-11-26 (×3): qty 1

## 2017-11-26 MED ORDER — ACETAMINOPHEN 650 MG RE SUPP: 650.0000 mg | Freq: Four times a day (QID) | RECTAL | Status: DC | PRN

## 2017-11-26 MED ORDER — VITAMIN D 1000 UNITS PO TABS
2000.0000 [IU] | ORAL_TABLET | Freq: Every day | ORAL | Status: DC
  Administered 2017-11-27 – 2017-12-01 (×5): 2000 [IU] via ORAL
  Filled 2017-11-26 (×5): qty 2

## 2017-11-26 MED ORDER — OXYCODONE HCL 5 MG PO TABS: 5.0000 mg | ORAL_TABLET | Freq: Four times a day (QID) | ORAL | Status: DC | PRN

## 2017-11-26 MED ORDER — SODIUM CHLORIDE 0.9% FLUSH
3.0000 mL | Freq: Two times a day (BID) | INTRAVENOUS | Status: DC
  Administered 2017-11-26 – 2017-12-01 (×9): 3 mL via INTRAVENOUS

## 2017-11-26 MED ORDER — ONDANSETRON HCL 4 MG/2ML IJ SOLN: 4.0000 mg | Freq: Four times a day (QID) | INTRAMUSCULAR | Status: DC | PRN

## 2017-11-26 MED ORDER — SENNA 8.6 MG PO TABS
1.0000 | ORAL_TABLET | Freq: Every day | ORAL | Status: DC
  Administered 2017-11-28 – 2017-12-01 (×4): 8.6 mg via ORAL
  Filled 2017-11-26 (×5): qty 1

## 2017-11-26 MED ORDER — CRANBERRY 450 MG PO TABS: 900.0000 mg | ORAL_TABLET | Freq: Every day | ORAL | Status: DC

## 2017-11-26 MED ORDER — NORMAL SALINE FLUSH 0.9 % IV SOLN: 10.0000 mL | INTRAVENOUS | Status: DC

## 2017-11-26 MED ORDER — ACETAMINOPHEN 325 MG PO TABS: 650.0000 mg | ORAL_TABLET | Freq: Four times a day (QID) | ORAL | Status: DC | PRN

## 2017-11-26 MED ORDER — BIOTENE DRY MOUTH MT LIQD: 10.0000 mL | Freq: Three times a day (TID) | OROMUCOSAL | Status: DC | PRN

## 2017-11-26 NOTE — ED Triage Notes (Signed)
Pt arrives to ED from [facility] after being called by the orthopedist to come to the ED since the pt's MRI resulted and showed a spinal abscess in her sacral area. EMS reports pt has no complaints unless she is walking, and it causes her pain. Pt placed in position of comfort with bed locked and lowered, call bell in reach.

## 2017-11-26 NOTE — ED Provider Notes (Signed)
Emergency Department Provider Note   I have reviewed the triage vital signs and the nursing notes.   HISTORY  Chief Complaint Spinal Abscess   HPI Rebecca Porter is a 77 y.o. female with PMH of recent admit for infected kidney stone with resulting sepsis at [facility] and persistent lower back pain presents to the emergency department with outpatient MRI showing possible discitis and epidural abscess. No IVDA, surgery to the spine, or other instrumentation. Patient was hospitalized in late July/early August at no font with sepsis likely from a urinary source.  She has a PICC line in place and completed 10 days of Ertapenem for the infection. Patient states that she was discharged to a rehab center but has had persistent back pain.  She followed up with Dr. Tonita Cong with [facility] for x-rays 2 days ago which were followed with MRI.  The MRI showed possible discitis and epidural abscess at the level of L4/L5.  She states that she only has lower back pain with movement.  She has not had fevers or shaking chills.  No numbness or weakness in the lower extremities.   Past Medical History:  Diagnosis Date  . Personal history of radiation therapy 2002    Patient Active Problem List   Diagnosis Date Noted  . 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 Obesity 11/26/2017    Past Surgical History:  Procedure Laterality Date  . BREAST BIOPSY Left    malignant stereo   . BREAST LUMPECTOMY Left 2002      Allergies Penicillins; Macrobid [nitrofurantoin macrocrystal]; Protonix [pantoprazole sodium]; and Ciprofloxacin hcl  Family History  Problem Relation Age of Onset  . Breast cancer Neg Hx     Social History Social History   Tobacco Use  . Smoking status: Never Smoker  . Smokeless tobacco: Never Used  Substance Use Topics  . Alcohol use: Never    Frequency: Never  . Drug use: Never    Review of Systems  Constitutional: No fever/chills Eyes: No visual changes. ENT: No sore  throat. Cardiovascular: Denies chest pain. Respiratory: Denies shortness of breath. Gastrointestinal: No abdominal pain.  No nausea, no vomiting.  No diarrhea.  No constipation. Genitourinary: Negative for dysuria. Musculoskeletal: Positive for intermittent back pain. Skin: Negative for rash. Neurological: Negative for headaches, focal weakness or numbness.  10-point ROS otherwise negative.  ____________________________________________   PHYSICAL EXAM:  VITAL SIGNS: ED Triage Vitals  Enc Vitals Group     BP 11/26/17 1716 120/90     Pulse Rate 11/26/17 1716 79     Resp 11/26/17 1716 19     Temp 11/26/17 1716 98.2 F (36.8 C)     Temp Source 11/26/17 1716 Oral     SpO2 11/26/17 1716 96 %     Weight 11/26/17 1719 200 lb (90.7 kg)     Height 11/26/17 1719 5\' 6"  (1.676 m)     Pain Score 11/26/17 1719 0   Constitutional: Alert and oriented. Well appearing and in no acute distress. Eyes: Conjunctivae are normal. Head: Atraumatic. Nose: No congestion/rhinnorhea. Mouth/Throat: Mucous membranes are moist.  Neck: No stridor.  Cardiovascular: Normal rate, regular rhythm. Good peripheral circulation. Grossly normal heart sounds.   Respiratory: Normal respiratory effort.  No retractions. Lungs CTAB. Gastrointestinal: Soft and nontender. No distention.  Musculoskeletal: No lower extremity tenderness nor edema. No gross deformities of extremities. Neurologic:  Normal speech and language. No gross focal neurologic deficits are appreciated.  Skin:  Skin is warm, dry and intact. No rash noted.  ____________________________________________   LABS (all labs ordered are listed, but only abnormal results are displayed)  Labs Reviewed  COMPREHENSIVE METABOLIC PANEL - Abnormal; Notable for the following components:      Result Value   Glucose, Bld 100 (*)    BUN 27 (*)    Total Protein 6.0 (*)    Albumin 2.4 (*)    Alkaline Phosphatase 145 (*)    All other components within normal  limits  CBC WITH DIFFERENTIAL/PLATELET - Abnormal; Notable for the following components:   WBC 11.5 (*)    RBC 3.20 (*)    Hemoglobin 9.7 (*)    HCT 30.2 (*)    RDW 15.9 (*)    Neutro Abs 8.1 (*)    All other components within normal limits  CULTURE, BLOOD (ROUTINE X 2)  CULTURE, BLOOD (ROUTINE X 2)  BASIC METABOLIC PANEL  CBC   ____________________________________________  RADIOLOGY  Dg Chest Portable 1 View  Result Date: 11/26/2017 CLINICAL DATA:  PICC line placement EXAM: PORTABLE CHEST 1 VIEW COMPARISON:  03/13/2009 FINDINGS: Right upper extremity catheter tip overlies the right brachiocephalic region. No acute opacity or pleural effusion. Heart size within normal limits. No pneumothorax. Aortic atherosclerosis. IMPRESSION: Tip of the right upper extremity catheter projects over the brachiocephalic region. Electronically Signed   By: Donavan Foil M.D.   On: 11/26/2017 18:40    ____________________________________________   PROCEDURES  Procedure(s) performed:   Procedures  None ____________________________________________   INITIAL IMPRESSION / ASSESSMENT AND PLAN / ED COURSE  Pertinent labs & imaging results that were available during my care of the patient were reviewed by me and considered in my medical decision making (see chart for details).  Patient presents to the emergency department for evaluation of epidural abscess and discitis.  Endings from MRI performed as an outpatient at the [facility].  Plan for labs, cultures.  Patient has normal strength and sensation in the lower extremities.  Will reach out orthopedics to see if they will manage this versus Neurosurgery.   09:30 PM Spoke with Dr. Zada Finders with [facility]. Abscess is not compressing any nerve roots. No surgery at this time. Hold abx. Plan for IR drainage for culture to direct abx therapy. Will discuss medicine admit with the hospitalist. I placed the MRI disc at the patient bedside in the outpatient med  records folder.   Discussed patient's case with Hospitalist, Dr. Shanon Brow to request admission. Patient and family (if present) updated with plan. Care transferred to Hospitalist service.  I reviewed all nursing notes, vitals, pertinent old records, EKGs, labs, imaging (as available).  ____________________________________________  FINAL CLINICAL IMPRESSION(S) / ED DIAGNOSES  Final diagnoses:  Epidural abscess     MEDICATIONS GIVEN DURING THIS VISIT:  Medications  megestrol (MEGACE) 400 MG/10ML suspension 800 mg (has no administration in time range)  cholecalciferol (VITAMIN D) tablet 2,000 Units (has no administration in time range)  loratadine (CLARITIN) tablet 10 mg (has no administration in time range)  oxybutynin (DITROPAN XL) 24 hr tablet 15 mg (has no administration in time range)  feeding supplement (GLUCERNA SHAKE) (GLUCERNA SHAKE) liquid 120 mL (has no administration in time range)  gabapentin (NEURONTIN) capsule 100 mg (has no administration in time range)  mirtazapine (REMERON) tablet 15 mg (has no administration in time range)  escitalopram (LEXAPRO) tablet 10 mg (has no administration in time range)  senna (SENOKOT) tablet 8.6 mg (has no administration in time range)  oxyCODONE (OXYCONTIN) 12 hr tablet 10 mg (has no administration in  time range)  antiseptic oral rinse (BIOTENE) solution 10 mL (has no administration in time range)  oxyCODONE (Oxy IR/ROXICODONE) immediate release tablet 5 mg (has no administration in time range)  sodium chloride flush (NS) 0.9 % injection 3 mL (has no administration in time range)  sodium chloride flush (NS) 0.9 % injection 3 mL (has no administration in time range)  0.9 %  sodium chloride infusion (has no administration in time range)  ketorolac (TORADOL) 30 MG/ML injection 30 mg (has no administration in time range)  acetaminophen (TYLENOL) tablet 650 mg (has no administration in time range)    Or  acetaminophen (TYLENOL) suppository 650  mg (has no administration in time range)  ondansetron (ZOFRAN) tablet 4 mg (has no administration in time range)    Or  ondansetron (ZOFRAN) injection 4 mg (has no administration in time range)    Note:  This document was prepared using Dragon voice recognition software and may include unintentional dictation errors.  Nanda Quinton, MD Emergency Medicine    Long, Wonda Olds, MD 11/26/17 [id number]

## 2017-11-26 NOTE — ED Notes (Signed)
Toileting offered to pt. Pt refused at this time. Pt states she will let staff know when she needs to use the restroom. Daughter at bedside.

## 2017-11-26 NOTE — Progress Notes (Signed)
PHARMACIST - PHYSICIAN ORDER COMMUNICATION  CONCERNING: P&T Medication Policy on Herbal Medications  DESCRIPTION:  This patient's order for:  Cranberry  has been noted.  This product(s) is classified as an "herbal" or natural product. Due to a lack of definitive safety studies or FDA approval, nonstandard manufacturing practices, plus the potential risk of unknown drug-drug interactions while on inpatient medications, the Pharmacy and Therapeutics Committee does not permit the use of "herbal" or natural products of this type within [place].   ACTION TAKEN: The pharmacy department is unable to verify this order at this time and your patient has been informed of this safety policy. Please reevaluate patient's clinical condition at discharge and address if the herbal or natural product(s) should be resumed at that time.   

## 2017-11-26 NOTE — Consult Note (Signed)
Neurosurgery Consultation  Reason for Consult: Epidural abscess of the lumbar spine Referring Physician: Dr. Laverta Baltimore  CC: back pain  HPI: This is a 77 y.o. woman that presents with severe back pain. She had an outpatient MRI performed and it was read as concerning for epidural abscess, so she was called and told to come in to the ED. No new weakness, numbness, or parasthesias, no recent change in bowel or bladder function. no recent use of anti-platelet or anti-coagulant medications. She recently had an admission with urosepsis 2/2 infected renal calculus, currently completing a course of ertapenem. ROS notable for significant back pain with movement, worse with movement in any plane, improved with rest, pain is sharp in quality, no radiation of pain. She has had back pain for three weeks along the same time course as her renal issues, it is not clear which occurred first.    ROS: A 14 point ROS was performed and is negative except as noted in the HPI.   PMHx:  Past Medical History:  Diagnosis Date  . Personal history of radiation therapy 2002   FamHx: Family History  Problem Relation Age of Onset  . Breast cancer Neg Hx    SocHx:   reports that she has never smoked. She has never used smokeless tobacco. She reports that she does not drink alcohol or use drugs.  Exam: Vital signs in last 24 hours: Temp:  [98.2 F (36.8 C)] 98.2 F (36.8 C) (08/16 1716) Pulse Rate:  [71-79] 75 (08/16 1800) Resp:  [15-19] 17 (08/16 1800) BP: (100-125)/(42-90) 100/42 (08/16 1800) SpO2:  [95 %-96 %] 96 % (08/16 1800) Weight:  [90.7 kg] 90.7 kg (08/16 1719)   General: Awake, alert, cooperative, lying in bed in NAD Head: normocephalic and atruamatic HEENT: neck supple Pulmonary: breathing room air comfortably, no evidence of increased work of breathing Cardiac: RRR Abdomen: obese S NT ND Extremities: warm and well perfused x4 Neuro: AOx3, PERRL, EOMI, FS Strength 5/5 x4 except 4/5 strength at the  hip flexors due to pain with leg elevation, SILTx4 except for a small linear area of numbness along the lateral thigh bilaterally that starts at the iliac crest and stops at the knee which is intermittent and has been present for three weeks, no drift  Assessment and Plan: 77 y.o. woman with severe low back pain. MRI L-spine personally reviewed, which shows L5-S1 discitis with associated epidural abscess that contacts the thecal sac but does not cause significant stenosis.  -no acute neurosurgical intervention indicated at this time, patient is neurologically intact except for some non-dermatomal numbness that she has had for three weeks and does not match an S1 distribution where her abscess is located. She had a very complicated recent admission with sepsis and is already deconditioned with a high frailty index -defer to hospitalist / ID, can consider IR guided biopsy of the disc space to confirm sensitivity -please call with any concerns or questions  Judith Part, MD 11/26/17 8:10 PM [facility]

## 2017-11-26 NOTE — H&P (Signed)
History and Physical    Rebecca Porter:[id number] DOB: 01-Sep-1940 DOA: 11/26/2017  PCP: Chesley Noon, MD  Patient coming from:  rehab  Chief Complaint:  Back pain, abnormal mri  HPI: Rebecca Porter is a 77 y.o. female with medical history significant of obesity, recent hospitalization at [place] with urosepsis and a kidney stone which grew out ESBL E. coli that was sensitive to ertapenem.  Patient was sent to rehab center and just completed a full course of ertapenem yesterday.  She has been having over a week of lower back pain however and saw an orthopedic surgeon who did an MRI of her back today.  MRI unfortunately today shows L4-L5 epidural abscess and discitis.  Patient was then called and told to come to the [facility] emergency department.  She denies any fevers.  She denies any nausea vomiting or diarrhea.  She denies any weakness in her legs.  No numbness tingling in her legs.  Neurosurgery is also called recommended IR to drain in the morning and no surgical intervention at this time.  Review of Systems: As per HPI otherwise 10 point review of systems negative.   Past Medical History:  Diagnosis Date  . Personal history of radiation therapy 2002    Past Surgical History:  Procedure Laterality Date  . BREAST BIOPSY Left    malignant stereo   . BREAST LUMPECTOMY Left 2002     reports that she has never smoked. She has never used smokeless tobacco. She reports that she does not drink alcohol or use drugs.  Allergies  Allergen Reactions  . Penicillins Anaphylaxis and Hives    Has patient had a PCN reaction causing immediate rash, facial/tongue/throat swelling, SOB or lightheadedness with hypotension: Yes Has patient had a PCN reaction causing severe rash involving mucus membranes or skin necrosis: No Has patient had a PCN reaction that required hospitalization: unknown Has patient had a PCN reaction occurring within the last 10 years: No If all of the above answers  are "NO", then may proceed with Cephalosporin use.  Santiago Bur [Nitrofurantoin Macrocrystal] Other (See Comments)    Unknown reaction  . Protonix [Pantoprazole Sodium] Other (See Comments)    Unknown reaction  . Ciprofloxacin Hcl Swelling and Rash    Throat swelling    Family History  Problem Relation Age of Onset  . Breast cancer Neg Hx     Prior to Admission medications   Medication Sig Start Date End Date Taking? Authorizing Provider  megestrol (MEGACE) 400 MG/10ML suspension Take 800 mg by mouth daily. 21 day course started 11/12/17   Yes [provider]    Physical Exam: Vitals:   11/26/17 1730 11/26/17 1800 11/26/17 2000 11/26/17 2200  BP: 125/64 (!) 100/42 126/66 123/70  Pulse: 71 75 66 68  Resp: 15 17 17 17   Temp:      TempSrc:      SpO2: 95% 96% 93% 96%  Weight:      Height:          Constitutional: NAD, calm, comfortable Vitals:   11/26/17 1730 11/26/17 1800 11/26/17 2000 11/26/17 2200  BP: 125/64 (!) 100/42 126/66 123/70  Pulse: 71 75 66 68  Resp: 15 17 17 17   Temp:      TempSrc:      SpO2: 95% 96% 93% 96%  Weight:      Height:       Eyes: PERRL, lids and conjunctivae normal ENMT: Mucous membranes are moist. Posterior pharynx  clear of any exudate or lesions.Normal dentition.  Neck: normal, supple, no masses, no thyromegaly Respiratory: clear to auscultation bilaterally, no wheezing, no crackles. Normal respiratory effort. No accessory muscle use.  Cardiovascular: Regular rate and rhythm, no murmurs / rubs / gallops. No extremity edema. 2+ pedal pulses. No carotid bruits.  Abdomen: no tenderness, no masses palpated. No hepatosplenomegaly. Bowel sounds positive.  Musculoskeletal: no clubbing / cyanosis. No joint deformity upper and lower extremities. Good ROM, no contractures. Normal muscle tone.  Skin: no rashes, lesions, ulcers. No induration Neurologic: CN 2-12 grossly intact. Sensation intact, DTR normal. Strength 5/5 in all 4.  Psychiatric:  Normal judgment and insight. Alert and oriented x 3. Normal mood.    Labs on Admission: I have personally reviewed following labs and imaging studies  CBC: Recent Labs  Lab 11/26/17 1942  WBC 11.5*  NEUTROABS 8.1*  HGB 9.7*  HCT 30.2*  MCV 94.4  PLT 102   Basic Metabolic Panel: Recent Labs  Lab 11/26/17 1942  NA 139  K 4.3  CL 108  CO2 25  GLUCOSE 100*  BUN 27*  CREATININE 0.77  CALCIUM 9.1   GFR: Estimated Creatinine Clearance: 67.9 mL/min (by C-G formula based on SCr of 0.77 mg/dL). Liver Function Tests: Recent Labs  Lab 11/26/17 1942  AST 16  ALT 21  ALKPHOS 145*  BILITOT 0.6  PROT 6.0*  ALBUMIN 2.4*   No results for input(s): LIPASE, AMYLASE in the last 168 hours. No results for input(s): AMMONIA in the last 168 hours. Coagulation Profile: No results for input(s): INR, PROTIME in the last 168 hours. Cardiac Enzymes: No results for input(s): CKTOTAL, CKMB, CKMBINDEX, TROPONINI in the last 168 hours. BNP (last 3 results) No results for input(s): PROBNP in the last 8760 hours. HbA1C: No results for input(s): HGBA1C in the last 72 hours. CBG: No results for input(s): GLUCAP in the last 168 hours. Lipid Profile: No results for input(s): CHOL, HDL, LDLCALC, TRIG, CHOLHDL, LDLDIRECT in the last 72 hours. Thyroid Function Tests: No results for input(s): TSH, T4TOTAL, FREET4, T3FREE, THYROIDAB in the last 72 hours. Anemia Panel: No results for input(s): VITAMINB12, FOLATE, FERRITIN, TIBC, IRON, RETICCTPCT in the last 72 hours. Urine analysis: No results found for: COLORURINE, APPEARANCEUR, LABSPEC, PHURINE, GLUCOSEU, HGBUR, BILIRUBINUR, KETONESUR, PROTEINUR, UROBILINOGEN, NITRITE, LEUKOCYTESUR Sepsis Labs: !!!!!!!!!!!!!!!!!!!!!!!!!!!!!!!!!!!!!!!!!!!! @LABRCNTIP (procalcitonin:4,lacticidven:4) )No results found for this or any previous visit (from the past 240 hour(s)).   Radiological Exams on Admission: Dg Chest Portable 1 View  Result Date:  11/26/2017 CLINICAL DATA:  PICC line placement EXAM: PORTABLE CHEST 1 VIEW COMPARISON:  03/13/2009 FINDINGS: Right upper extremity catheter tip overlies the right brachiocephalic region. No acute opacity or pleural effusion. Heart size within normal limits. No pneumothorax. Aortic atherosclerosis. IMPRESSION: Tip of the right upper extremity catheter projects over the brachiocephalic region. Electronically Signed   By: Donavan Foil M.D.   On: 11/26/2017 18:40    Old chart reviewed from rehab Case discussed with dr Vanita Panda in ed  Assessment/Plan 77 year old female with recent urosepsis and infected stone with a ESBL E. coli at [place] comes in with lower back pain found to have an epidural abscess that L for L5 and discitis  Principal Problem:   Epidural abscess-patient afebrile vital signs stable.  Will hold antibiotics at this time in order for IR to get a culture and drain this abscess in the morning.  Active Problems:   Obesity-noted   Med rec pending pharmacy review  DVT prophylaxis: scds Code Status:  full Family Communication: dtr Disposition Plan:  2-4 days Consults called:  IR, neurosurgery Admission status:  admission   Mayte Diers A MD [facility]  If 7PM-7AM, please contact night-coverage [email or web address] Password [facility] [place]  11/26/2017, 10:17 PM

## 2017-11-26 NOTE — Progress Notes (Signed)
Paged [facility] for a diet prior to midnight. Awaiting order.

## 2017-11-26 NOTE — ED Notes (Signed)
Pt has PICC line in right upper arm, was placed at [facility]. MD aware, chest ray ordered for verification of placement.

## 2017-11-26 NOTE — ED Notes (Signed)
ED TO INPATIENT HANDOFF REPORT  Name/Age/Gender Rebecca Porter 77 y.o. female  Code Status   Home/SNF/Other Skilled nursing facility  Chief Complaint SPinal AB  Level of Care/Admitting Diagnosis ED Disposition    ED Disposition Condition [place]: [place] [[id number]]  Level of Care: Med-Surg [16]  Diagnosis: Epidural abscess [115726]  Admitting Physician: Phillips Grout [[id number]]  Attending Physician: Derrill Kay A [[id number]]  Estimated length of stay: 3 - 4 days  Certification:: I certify this patient will need inpatient services for at least 2 midnights  PT Class (Do Not Modify): Inpatient [101]  PT Acc Code (Do Not Modify): Private [1]       Medical History Past Medical History:  Diagnosis Date  . Personal history of radiation therapy 2002    Allergies Allergies  Allergen Reactions  . Penicillins Anaphylaxis and Rash  . Macrobid [Nitrofurantoin Macrocrystal]     "I just know I can't take it, I don't remember why"  . Ciprofloxacin Hcl Rash    IV Location/Drains/Wounds Patient Lines/Drains/Airways Status   Active Line/Drains/Airways    None          Labs/Imaging Results for orders placed or performed during the hospital encounter of 11/26/17 (from the past 48 hour(s))  Comprehensive metabolic panel     Status: Abnormal   Collection Time: 11/26/17  7:42 PM  Result Value Ref Range   Sodium 139 135 - 145 mmol/L   Potassium 4.3 3.5 - 5.1 mmol/L   Chloride 108 98 - 111 mmol/L   CO2 25 22 - 32 mmol/L   Glucose, Bld 100 (H) 70 - 99 mg/dL   BUN 27 (H) 8 - 23 mg/dL   Creatinine, Ser 0.77 0.44 - 1.00 mg/dL   Calcium 9.1 8.9 - 10.3 mg/dL   Total Protein 6.0 (L) 6.5 - 8.1 g/dL   Albumin 2.4 (L) 3.5 - 5.0 g/dL   AST 16 15 - 41 U/L   ALT 21 0 - 44 U/L   Alkaline Phosphatase 145 (H) 38 - 126 U/L   Total Bilirubin 0.6 0.3 - 1.2 mg/dL   GFR calc non Af Amer >60 >60 mL/min   GFR calc Af Amer >60 >60 mL/min    Comment:  (NOTE) The eGFR has been calculated using the CKD EPI equation. This calculation has not been validated in all clinical situations. eGFR's persistently <60 mL/min signify possible Chronic Kidney Disease.    Anion gap 6 5 - 15    Comment: Performed at [facility] [address] [place], [place] [address]  CBC with Differential     Status: Abnormal   Collection Time: 11/26/17  7:42 PM  Result Value Ref Range   WBC 11.5 (H) 4.0 - 10.5 K/uL    Comment: REPEATED TO VERIFY WHITE COUNT CONFIRMED ON SMEAR    RBC 3.20 (L) 3.87 - 5.11 MIL/uL   Hemoglobin 9.7 (L) 12.0 - 15.0 g/dL   HCT 30.2 (L) 36.0 - 46.0 %   MCV 94.4 78.0 - 100.0 fL   MCH 30.3 26.0 - 34.0 pg   MCHC 32.1 30.0 - 36.0 g/dL   RDW 15.9 (H) 11.5 - 15.5 %   Platelets 237 150 - 400 K/uL    Comment: REPEATED TO VERIFY PLATELET COUNT CONFIRMED BY SMEAR    Neutrophils Relative % 70 %   Lymphocytes Relative 18 %   Monocytes Relative 9 %   Eosinophils Relative 2 %  Basophils Relative 1 %   Neutro Abs 8.1 (H) 1.7 - 7.7 K/uL   Lymphs Abs 2.1 0.7 - 4.0 K/uL   Monocytes Absolute 1.0 0.1 - 1.0 K/uL   Eosinophils Absolute 0.2 0.0 - 0.7 K/uL   Basophils Absolute 0.1 0.0 - 0.1 K/uL   WBC Morphology MILD LEFT SHIFT (1-5% METAS, OCC MYELO, OCC BANDS)     Comment: Performed at [facility] [address] [place], [place] [address]   Dg Chest Portable 1 View  Result Date: 11/26/2017 CLINICAL DATA:  PICC line placement EXAM: PORTABLE CHEST 1 VIEW COMPARISON:  03/13/2009 FINDINGS: Right upper extremity catheter tip overlies the right brachiocephalic region. No acute opacity or pleural effusion. Heart size within normal limits. No pneumothorax. Aortic atherosclerosis. IMPRESSION: Tip of the right upper extremity catheter projects over the brachiocephalic region. Electronically Signed   By: Donavan Foil M.D.   On: 11/26/2017 18:40    Pending Labs Unresulted Labs (From admission, onward)    Start     Ordered   11/26/17  1746  Culture, blood (routine x 2)  BLOOD CULTURE X 2,   STAT     11/26/17 1745          Vitals/Pain Today's Vitals   11/26/17 1730 11/26/17 1735 11/26/17 1800 11/26/17 2000  BP: 125/64  (!) 100/42 126/66  Pulse: 71  75 66  Resp: 15  17 17   Temp:      TempSrc:      SpO2: 95%  96% 93%  Weight:      Height:      PainSc:  0-No pain      Isolation Precautions No active isolations  Medications Medications - No data to display  Mobility walks with device

## 2017-11-27 DIAGNOSIS — G062 Extradural and subdural abscess, unspecified: Secondary | ICD-10-CM

## 2017-11-27 LAB — BASIC METABOLIC PANEL
ANION GAP: 5 (ref 5–15)
BUN: 24 mg/dL — ABNORMAL HIGH (ref 8–23)
CALCIUM: 9 mg/dL (ref 8.9–10.3)
CO2: 25 mmol/L (ref 22–32)
Chloride: 108 mmol/L (ref 98–111)
Creatinine, Ser: 0.77 mg/dL (ref 0.44–1.00)
GFR calc Af Amer: >60 mL/min (ref >60)
GFR calc non Af Amer: >60 mL/min (ref >60)
Glucose, Bld: 91 mg/dL (ref 70–99)
Potassium: 4.1 mmol/L (ref 3.5–5.1)
SODIUM: 138 mmol/L (ref 135–145)

## 2017-11-27 LAB — MRSA PCR SCREENING: MRSA BY PCR: NEGATIVE

## 2017-11-27 LAB — CBC
HCT: 29.4 % — ABNORMAL LOW (ref 36.0–46.0)
HEMOGLOBIN: 9.5 g/dL — AB (ref 12.0–15.0)
MCH: 30.4 pg (ref 26.0–34.0)
MCHC: 32.3 g/dL (ref 30.0–36.0)
MCV: 93.9 fL (ref 78.0–100.0)
PLATELETS: 259 10*3/uL (ref 150–400)
RBC: 3.13 MIL/uL — AB (ref 3.87–5.11)
RDW: 15.9 % — ABNORMAL HIGH (ref 11.5–15.5)
WBC: 9.5 10*3/uL (ref 4.0–10.5)

## 2017-11-27 MED ORDER — SODIUM CHLORIDE 0.9% FLUSH: 10.0000 mL | INTRAVENOUS | Status: DC | PRN

## 2017-11-27 MED ORDER — SODIUM CHLORIDE 0.9% FLUSH: 10.0000 mL | Freq: Two times a day (BID) | INTRAVENOUS | Status: DC

## 2017-11-27 NOTE — Progress Notes (Signed)
.   [facility] HOSPITALIST PROGRESS NOTE  Rebecca Porter [id number] DOB: Sep 13, 1940 DOA: 11/26/2017 PCP: Chesley Noon, MD   Narrative: 77 year old Caucasian female Recent admission to [facility] nephrolithiasis + sepsis-sounds like ESBL infection and had PICC line placed for 10 days ertapenem-[facility] and because of persistent back pain saw [facility]-pain x-rays at the time suggested spondylolisthesis MRI eventually performed showed abscess and discitis L4/L5 exaggerated by movement  Admitted IR consulted   A & Plan Epidural abscess-no antibiotics at this time-pain control-able to lift legs-nursing aware to contact IR to see patient regarding drainage, fluid bacterial analysis important as would guide therapy-risk for resistant organisms so would not empirically treat at this time Blood culture X2 pending White count not elevated Pain seems controlled at this time on current regimen  Urinary incontinence-continue Ditropan XL 15 mg  Constipation continue senna 1 tab daily  Bipolar continue Lexapro 10 daily, Remeron 15 at bedtime  CKD stage I-stable  Normocytic anemia      DVT prophylaxis: lovenox code Status: Presumed full code family Communication: None disposition Plan: Await IR input   Rebecca Shorb, MD  [facility] Direct contact: [phone] --Via amion app OR  --[email or web address]; password [email or web address]  7PM-7AM contact night coverage as above 11/27/2017, 10:10 AM  LOS: 1 day   Consultants:  IR  Ortho  Procedures:  None yet  Antimicrobials:   NONE  Interval history/Subjective: Awake alert pleasant no distress looks younger than stated age Tells me at baseline drives cooks for herself takes care of herself and goes out to dinner with 5 friends Very high functioning No fever, no chills, no chest pain, no pain on laying supine without movement  Objective:  Vitals:  Vitals:   11/27/17 0335 11/27/17 0831  BP: 113/61  114/70  Pulse: 68 (!) 59  Resp: 18 18  Temp: 98.3 F (36.8 C) 98.3 F (36.8 C)  SpO2: 93% 97%    Exam:  . Does not alert oriented no icterus no pallor . Mallampati 2 . S1-S2 no murmur telemetry benign . Chest is clear . Abdomen soft nontender able to raise both legs off of bed-complains of numbness on the lateral side of the leg down to the lateral joint line of both knees   I have personally reviewed the following:   Labs:  BUN/creatinine 24/0.7  Hemoglobin 9.5  Imaging studies:  No  Medical tests:  n   Test discussed with performing physician:  n  Decision to obtain old records:  n  Review and summation of old records:  y  Scheduled Meds: . cholecalciferol  2,000 Units Oral Daily  . escitalopram  10 mg Oral Daily  . feeding supplement (GLUCERNA SHAKE)  120 mL Oral TID  . gabapentin  100 mg Oral TID  . loratadine  10 mg Oral Daily  . megestrol  800 mg Oral Daily  . mirtazapine  15 mg Oral QHS  . oxybutynin  15 mg Oral Daily  . oxyCODONE  10 mg Oral BID  . senna  1 tablet Oral Daily  . sodium chloride flush  10-40 mL Intracatheter Q12H  . sodium chloride flush  3 mL Intravenous Q12H   Continuous Infusions: . sodium chloride      Principal Problem:   Epidural abscess Active Problems:   Obesity   LOS: 1 day

## 2017-11-27 NOTE — Progress Notes (Signed)
Spoke with [facility] provider [place], regarding PICC line position. Provider stated she will place an order regarding the use of the PICC line. Rebecca Porter

## 2017-11-27 NOTE — Consult Note (Signed)
Chief Complaint: Patient was seen in consultation today for epidural abscess  Referring Physician(s): Dr. Shanon Brow  Supervising Physician: Markus Daft  Patient Status: [facility] - In-pt  History of Present Illness: Rebecca Porter is a 77 y.o. female with past medical history of breast cancer, kidney stones who presents to [facility] ED with approximately 3 week history of back pain.  Patient was recently treated for kidney stones complicated by urosepsis.  This required IV abx-- patient completed course of ertapenem 8/15. Due to incomplete resolve of her acute back pain she presented for further evaluation at her orthopedic who referred her to the ED after MRI results.  Based on imaging and neurosurgery recommendation IR consulted for aspiration of abscess.   Past Medical History:  Diagnosis Date  . Personal history of radiation therapy 2002    Past Surgical History:  Procedure Laterality Date  . BREAST BIOPSY Left    malignant stereo   . BREAST LUMPECTOMY Left 2002    Allergies: Penicillins; Macrobid [nitrofurantoin macrocrystal]; Protonix [pantoprazole sodium]; and Ciprofloxacin hcl  Medications: Prior to Admission medications   Medication Sig Start Date End Date Taking? Authorizing Provider  antiseptic oral rinse (BIOTENE) LIQD 10 mLs by Mouth Rinse route 3 (three) times daily as needed for dry mouth. Swish and spit   Yes [provider]  Cholecalciferol (VITAMIN D) 2000 units tablet Take 2,000 Units by mouth daily.   Yes [provider]  Cranberry 450 MG TABS Take 900 mg by mouth daily.   Yes [provider]  escitalopram (LEXAPRO) 10 MG tablet Take 10 mg by mouth daily.   Yes [provider]  gabapentin (NEURONTIN) 100 MG capsule Take 100 mg by mouth 3 (three) times daily.   Yes [provider]  loratadine (CLARITIN) 10 MG tablet Take 10 mg by mouth daily.   Yes [provider]  megestrol (MEGACE) 400 MG/10ML suspension Take 800  mg by mouth daily. 21 day course started 11/12/17   Yes [provider]  mirtazapine (REMERON) 15 MG tablet Take 15 mg by mouth at bedtime.   Yes [provider]  naproxen sodium (ALEVE) 220 MG tablet Take 220 mg by mouth at bedtime.   Yes [provider]  NUTRITIONAL SUPPLEMENT LIQD Take 60 mLs by mouth 3 (three) times daily. MedPass 2.0   Yes [provider]  oxybutynin (DITROPAN XL) 15 MG 24 hr tablet Take 15 mg by mouth daily.   Yes [provider]  oxyCODONE (OXY IR/ROXICODONE) 5 MG immediate release tablet Take 5 mg by mouth every 6 (six) hours as needed for breakthrough pain.   Yes [provider]  Oxycodone HCl 10 MG TABS Take 10 mg by mouth 2 (two) times daily.   Yes [provider]  senna (SENOKOT) 8.6 MG TABS tablet Take 1 tablet by mouth daily.   Yes [provider]  Sodium Chloride Flush (NORMAL SALINE FLUSH) 0.9 % SOLN Inject 10 mLs into the vein See admin instructions. "flush with 10 cc saline 12 post ABT"   Yes [provider]  ertapenem 1 g in sodium chloride 0.9 % 100 mL Inject 1 g into the vein daily. 12 day course completed 11/24/17    [provider]     Family History  Problem Relation Age of Onset  . Breast cancer Neg Hx     Social History   Socioeconomic History  . Marital status: Widowed    Spouse name: Not on file  .  Number of children: Not on file  . Years of education: Not on file  . Highest education level: Not on file  Occupational History  . Not on file  Social Needs  . Financial resource strain: Not on file  . Food insecurity:    Worry: Not on file    Inability: Not on file  . Transportation needs:    Medical: Not on file    Non-medical: Not on file  Tobacco Use  . Smoking status: Never Smoker  . Smokeless tobacco: Never Used  Substance and Sexual Activity  . Alcohol use: Never    Frequency: Never  . Drug use: Never  . Sexual activity: Not Currently    Lifestyle  . Physical activity:    Days per week: Not on file    Minutes per session: Not on file  . Stress: Not on file  Relationships  . Social connections:    Talks on phone: Not on file    Gets together: Not on file    Attends religious service: Not on file    Active member of club or organization: Not on file    Attends meetings of clubs or organizations: Not on file    Relationship status: Not on file  Other Topics Concern  . Not on file  Social History Narrative  . Not on file     Review of Systems: A 12 point ROS discussed and pertinent positives are indicated in the HPI above.  All other systems are negative.  Review of Systems  Constitutional: Negative for fatigue and fever.  Respiratory: Negative for cough and shortness of breath.   Cardiovascular: Negative for chest pain.  Gastrointestinal: Negative for abdominal pain, nausea and vomiting.  Genitourinary: Negative for dysuria.  Musculoskeletal: Positive for back pain.  Psychiatric/Behavioral: Negative for behavioral problems and confusion.    Vital Signs: BP 111/73 (BP Location: Left Arm)   Pulse 61   Temp 98.1 F (36.7 C) (Oral)   Resp 18   Ht 5\' 6"  (1.676 m)   Wt 259 lb 7.7 oz (117.7 kg)   SpO2 98%   BMI 41.88 kg/m   Physical Exam  Constitutional: She is oriented to person, place, and time. She appears well-developed. No distress.  Neck: Normal range of motion. Neck supple. No tracheal deviation present.  Cardiovascular: Normal rate, regular rhythm and normal heart sounds.  Pulmonary/Chest: Effort normal and breath sounds normal. No respiratory distress. She has no wheezes.  Musculoskeletal: She exhibits tenderness.  Neurological: She is alert and oriented to person, place, and time.  Skin: Skin is warm and dry. She is not diaphoretic.  Psychiatric: She has a normal mood and affect. Her behavior is normal. Judgment and thought content normal.  Nursing note and vitals reviewed.         Imaging: Dg Chest Portable 1 View  Result Date: 11/26/2017 CLINICAL DATA:  PICC line placement EXAM: PORTABLE CHEST 1 VIEW COMPARISON:  03/13/2009 FINDINGS: Right upper extremity catheter tip overlies the right brachiocephalic region. No acute opacity or pleural effusion. Heart size within normal limits. No pneumothorax. Aortic atherosclerosis. IMPRESSION: Tip of the right upper extremity catheter projects over the brachiocephalic region. Electronically Signed   By: Donavan Foil M.D.   On: 11/26/2017 18:40    Labs:  CBC: Recent Labs    11/26/17 1942 11/27/17 0620  WBC 11.5* 9.5  HGB 9.7* 9.5*  HCT 30.2* 29.4*  PLT 237 259    COAGS: No results for input(s): INR, APTT  in the last 8760 hours.  BMP: Recent Labs    11/26/17 1942 11/27/17 0620  NA 139 138  K 4.3 4.1  CL 108 108  CO2 25 25  GLUCOSE 100* 91  BUN 27* 24*  CALCIUM 9.1 9.0  CREATININE 0.77 0.77  GFRNONAA >60 >60  GFRAA >60 >60    LIVER FUNCTION TESTS: Recent Labs    11/26/17 1942  BILITOT 0.6  AST 16  ALT 21  ALKPHOS 145*  PROT 6.0*  ALBUMIN 2.4*    TUMOR MARKERS: No results for input(s): AFPTM, CEA, CA199, CHROMGRNA in the last 8760 hours.  Assessment and Plan: Back pain, suspected epidural abscess IR aware of request for epidural abscess aspiration.  Patient had recent MRI performed at an outside location. Despite multiple efforts, imaging cannot be uploaded for review by IR MD today.  Dr. Anselm Pancoast has spoken with Ortho as well as Dr. [name].  Plan is to repeat imaging which will be reviewed once available.  IR following.   Thank you for this interesting consult.  I greatly enjoyed meeting HARRIETTE TOVEY and look forward to participating in their care.  A copy of this report was sent to the requesting provider on this date.  Electronically Signed: Docia Barrier, PA 11/27/2017, 1:16 PM   I spent a total of 40 Minutes    in face to face in clinical consultation, greater than  50% of which was counseling/coordinating care for back pain.

## 2017-11-27 NOTE — Progress Notes (Signed)
Spoke with IV team nurse about PICC line in Pt right arm, nurses noted that chest xray shows IV in the right brachiocephalic region, which  may not be suitable for all types of infusions. Paged on call provider with [facility] to ensure that PICC line is usable. Arlis Porta

## 2017-11-27 NOTE — Progress Notes (Signed)
Spoke with Sam, RN regarding pt PICC line. Pt admitted with PICC from outside facility. CXR done on 8/16 confirms PICC tip in brachiocephalic region. RN to notify MD to obtain order to use as is if only needed for saline, or order for PICC to be replaced.

## 2017-11-27 NOTE — Progress Notes (Signed)
Discussed with neurosurgery Dr. Zada Finders regarding coordination of care for evaluation of epidural abscess-he will discuss directly with IR who will be obtaining sample and I appreciate their input

## 2017-11-27 NOTE — Progress Notes (Signed)
Spoke with Dr [name] re PICC malpositioning.  New order to d/c PICC.

## 2017-11-27 NOTE — Social Work (Signed)
CSW acknowledging consult for Advanced Directives, will page weekend chaplain coverage to complete these with pt.  Also aware pt has been admitted from [facility] where she was receiving iv antibiotics and therapies. Will follow for disposition. [place], [place] Work [phone]

## 2017-11-27 NOTE — Progress Notes (Signed)
RUA PICC line d/c as per MD order.  Site CDI without signs of infection or bleeding.  Pt verbalizes understanding of site instructions, when to call RN or MD, signs of infection or bleeding and interventions for both via teachback method.  Site cleaned with CHG, sutures removed, Vaseline guaze applied and covered with dry 2x2.

## 2017-11-27 NOTE — Progress Notes (Signed)
Paged to get AD-Family has copy with them and gave to nurse to place on patient chart-did not need chaplain to do AD but had prayer with patient and daughter. Conard Novak, Chaplain   11/27/17 1100  Clinical Encounter Type  Visited With Patient and family together  Visit Type Other (Comment) (Social worker request AD consult)  Referral From Social work  Consult/Referral To Chaplain  Recommendations  (already has AD)  Spiritual Encounters  Spiritual Needs Prayer  Stress Factors  Patient Stress Factors Other (Comment) (absess, very uncomfortable)

## 2017-11-28 ENCOUNTER — Inpatient Hospital Stay (HOSPITAL_COMMUNITY): Payer: Medicare Other

## 2017-11-28 LAB — URINALYSIS, COMPLETE (UACMP) WITH MICROSCOPIC
Bilirubin Urine: NEGATIVE
Glucose, UA: NEGATIVE mg/dL
Ketones, ur: NEGATIVE mg/dL
Nitrite: NEGATIVE
PROTEIN: NEGATIVE mg/dL
RBC / HPF: >50 RBC/hpf — ABNORMAL HIGH (ref 0–5)
SPECIFIC GRAVITY, URINE: 1.008 (ref 1.005–1.030)
pH: 7 (ref 5.0–8.0)

## 2017-11-28 LAB — PROTIME-INR
INR: 1.11
PROTHROMBIN TIME: 14.2 s (ref 11.4–15.2)

## 2017-11-28 MED ORDER — GADOBENATE DIMEGLUMINE 529 MG/ML IV SOLN
20.0000 mL | Freq: Once | INTRAVENOUS | Status: AC | PRN
  Administered 2017-11-28: 20 mL via INTRAVENOUS

## 2017-11-28 MED ORDER — POLYETHYLENE GLYCOL 3350 17 G PO PACK
17.0000 g | PACK | Freq: Every day | ORAL | Status: DC
  Administered 2017-11-28 – 2017-12-01 (×3): 17 g via ORAL
  Filled 2017-11-28 (×3): qty 1

## 2017-11-28 NOTE — Progress Notes (Signed)
.   [facility] H[facility]PITALIST PROGRESS NOTE  Rebecca Porter [id number] DOB: [birth year] DOA: 11/26/2017 PCP: Chesley Noon, MD   Narrative: 77 year old Caucasian female Recent admission to [facility] nephrolithiasis + sepsis-sounds like ESBL infection and had PICC line placed for 10 days ertapenem-[facility] and because of persistent back pain saw [facility] 8/[facility]-pain x-rays at the time suggested spondylolisthesis MRI eventually performed showed abscess and discitis L4/L5 exaggerated by movement  Admitted IR consulted-rpt MRI confirm phlegmon   A & Plan Epidural abscess-no antibiotics at this time until at least cultures taken-will ask ID for guidance--risk for resistant organisms so would not empirically treat at this time until we have preliminary C/S White count not elevated Pain seems controlled at this time on current regimen  Urinary incontinence-continue Ditropan XL 15 mg  Constipation continue senna 1 tab daily  Bipolar continue Lexapro 10 daily, Remeron 15 at bedtime  CKD stage I-stable  Normocytic anemia   Mild constipation-she doesn't wish any new meds at this time     DVT prophylaxis: lovenox code Status: Presumed full code family Communication: None disposition Plan: Await IR input   Michiah Masse, MD  [facility] Direct contact: [phone] --Via amion app OR  --[email or web address]; password [email or web address]  7PM-7AM contact night coverage as above 11/28/2017, 9:07 AM  LOS: 2 days   Consultants:  IR  Ortho  ID  Procedures:  None yet  Antimicrobials:   NONE yet  Interval history/Subjective:  No new issues eating full meals constipated No fever nor chills today Asking when she will have procedure  Objective:  Vitals:  Vitals:   11/28/17 0337 11/28/17 0901  BP: (!) 112/58 123/63  Pulse: 63 64  Resp: 20 19  Temp: 98 F (36.7 C) 98.1 F (36.7 C)  SpO2: 99% 100%    Exam:  . Awake alert in nad . Mallampati  2 . S1-S2 no murmur telemetry benign . Chest is clear . Abdomen soft nontender able to raise both legs off of bed-   I have personally reviewed the following:   Labs:  Blood culture X2 pending  Imaging studies:  No  Medical tests:  n   Test discussed with performing physician:  n  Decision to obtain old records:  n  Review and summation of old records:  y  Scheduled Meds: . cholecalciferol  2,000 Units Oral Daily  . escitalopram  10 mg Oral Daily  . feeding supplement (GLUCERNA SHAKE)  120 mL Oral TID  . gabapentin  100 mg Oral TID  . loratadine  10 mg Oral Daily  . megestrol  800 mg Oral Daily  . mirtazapine  15 mg Oral QHS  . oxybutynin  15 mg Oral Daily  . oxyCODONE  10 mg Oral BID  . senna  1 tablet Oral Daily  . sodium chloride flush  3 mL Intravenous Q12H   Continuous Infusions: . sodium chloride      Principal Problem:   Epidural abscess Active Problems:   Obesity   LOS: 2 days

## 2017-11-28 NOTE — Clinical Social Work Note (Signed)
Clinical Social Work Assessment  Patient Details  Name: Rebecca Porter MRN: [id number] Date of Birth: 11/22/1940  Date of referral:  11/28/17               Reason for consult:  Facility Placement                Permission sought to share information with:  Family Supports Permission granted to share information::  Yes, Verbal Permission Granted  Name::     Joycelyn Schmid  Agency::  [facility]  Relationship::  daughter  Contact Information:  [phone]  Housing/Transportation Living arrangements for the past 2 months:  [place] of Information:  Patient Patient Interpreter Needed:  None Criminal Activity/Legal Involvement Pertinent to Current Situation/Hospitalization:  No - Comment as needed Significant Relationships:  Adult Children Lives with:  Self Do you feel safe going back to the place where you live?  No Need for family participation in patient care:  No (Coment)  Care giving concerns:  Pt is alert and oriented.   Social Worker assessment / plan:  CSW spoke with pt. Pt lives at home, independently. Pt states they are still deciding what to do about her back. Pt was previously at [facility] for IV med management. Pt is agreeable to go back to [facility] for rehab if needed. CSW will f/o pt out to [facility] and continue to follow.  Employment status:  Retired Forensic scientist:  Medicare PT Recommendations:  [place] / Referral to community resources:  [place]  Patient/Family's Response to care:  Pt verbalized understanding of CSW role and expressed appreciation for support. Pt denies any concern regarding pt care at this time.   Patient/Family's Understanding of and Emotional Response to Diagnosis, Current Treatment, and Prognosis:  Pt understanding and realistic regarding physical limitations. Pt understands the possibly for the need for SNF placement at d/c. Pt agreeable to SNF placement at d/c, at this  time. Pt's responses emotionally appropriate during conversation with CSW. Pt denies any concern regarding treatment plan at this time. CSW will continue to provide support and facilitate d/c needs.   Emotional Assessment Appearance:  Appears stated age Attitude/Demeanor/Rapport:  (Patient was appropriate) Affect (typically observed):  Accepting, Appropriate, Calm Orientation:  Oriented to Self, Oriented to Place, Oriented to  Time, Oriented to Situation Alcohol / Substance use:  Not Applicable Psych involvement (Current and /or in the community):  No (Comment)  Discharge Needs  Concerns to be addressed:  Basic Needs, Care Coordination Readmission within the last 30 days:  No Current discharge risk:  Dependent with Mobility Barriers to Discharge:  Continued Medical Work up   [employer], LCSW 11/28/2017, 3:28 PM

## 2017-11-28 NOTE — Progress Notes (Signed)
Imaging obtained overnight.   MR LUMBAR SPINE IMPRESSION:  1. 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 Associated epidural phlegmon extending from L3-4 through L5-S1 with a few possible tiny micro epidural abscesses as above. No drainable paraspinous collection identified. 2. Subtle changes of possible early discitis involving the posterior L4-5 interspace. 3. 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 4. Additional moderate multilevel degenerative spondylolysis as detailed above. Please see above report for a full description of these findings.  MR SACRUM IMPRESSION:  1. 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 No other evidence for acute infection about the sacrum or within the visualized pelvis. 2. Large volume retained stool impacted within the rectal vault, suggesting constipation. 3. 12 mm simple left adnexal cyst, indeterminate. Further evaluation with nonemergent pelvic ultrasound recommended for further Characterization.  Discussed case with Dr. Anselm Pancoast who recommends review with Neurointerventional Radiologist.  Once reviewed, will be able to determine whether case is possible. Will make patient NPO after midnight tonight.  Have also ordered urine culture as well as INR.  Note plans for ID involvement once culture obtained.   Brynda Greathouse, MS RD PA-C 12:06 PM

## 2017-11-29 ENCOUNTER — Inpatient Hospital Stay (HOSPITAL_COMMUNITY): Payer: Medicare Other

## 2017-11-29 ENCOUNTER — Encounter (HOSPITAL_COMMUNITY): Payer: Self-pay | Admitting: Interventional Radiology

## 2017-11-29 DIAGNOSIS — M4627 Osteomyelitis of vertebra, lumbosacral region: Secondary | ICD-10-CM

## 2017-11-29 DIAGNOSIS — M869 Osteomyelitis, unspecified: Secondary | ICD-10-CM

## 2017-11-29 DIAGNOSIS — Z881 Allergy status to other antibiotic agents status: Secondary | ICD-10-CM

## 2017-11-29 DIAGNOSIS — R7881 Bacteremia: Secondary | ICD-10-CM

## 2017-11-29 DIAGNOSIS — F329 Major depressive disorder, single episode, unspecified: Secondary | ICD-10-CM | POA: Insufficient documentation

## 2017-11-29 DIAGNOSIS — N179 Acute kidney failure, unspecified: Secondary | ICD-10-CM | POA: Insufficient documentation

## 2017-11-29 DIAGNOSIS — Z853 Personal history of malignant neoplasm of breast: Secondary | ICD-10-CM

## 2017-11-29 DIAGNOSIS — Z889 Allergy status to unspecified drugs, medicaments and biological substances status: Secondary | ICD-10-CM

## 2017-11-29 DIAGNOSIS — Z87442 Personal history of urinary calculi: Secondary | ICD-10-CM

## 2017-11-29 DIAGNOSIS — Z880 Allergy status to penicillin: Secondary | ICD-10-CM

## 2017-11-29 DIAGNOSIS — B9629 Other Escherichia coli [E. coli] as the cause of diseases classified elsewhere: Secondary | ICD-10-CM

## 2017-11-29 DIAGNOSIS — R112 Nausea with vomiting, unspecified: Secondary | ICD-10-CM

## 2017-11-29 DIAGNOSIS — Z1612 Extended spectrum beta lactamase (ESBL) resistance: Secondary | ICD-10-CM

## 2017-11-29 DIAGNOSIS — F32A Depression, unspecified: Secondary | ICD-10-CM | POA: Insufficient documentation

## 2017-11-29 DIAGNOSIS — G061 Intraspinal abscess and granuloma: Principal | ICD-10-CM

## 2017-11-29 DIAGNOSIS — N390 Urinary tract infection, site not specified: Secondary | ICD-10-CM

## 2017-11-29 DIAGNOSIS — M4647 Discitis, unspecified, lumbosacral region: Secondary | ICD-10-CM

## 2017-11-29 HISTORY — PX: IR LUMBAR DISC ASPIRATION W/IMG GUIDE: IMG5306

## 2017-11-29 LAB — URINE CULTURE

## 2017-11-29 MED ORDER — MIDAZOLAM HCL 2 MG/2ML IJ SOLN
INTRAMUSCULAR | Status: AC | PRN
  Administered 2017-11-29: 1 mg via INTRAVENOUS
  Administered 2017-11-29: 0.5 mg via INTRAVENOUS

## 2017-11-29 MED ORDER — MIDAZOLAM HCL 2 MG/2ML IJ SOLN
INTRAMUSCULAR | Status: AC
  Filled 2017-11-29: qty 2

## 2017-11-29 MED ORDER — LIDOCAINE HCL (PF) 1 % IJ SOLN
INTRAMUSCULAR | Status: AC
  Filled 2017-11-29: qty 30

## 2017-11-29 MED ORDER — LIDOCAINE HCL (PF) 1 % IJ SOLN
INTRAMUSCULAR | Status: AC | PRN
  Administered 2017-11-29: 10 mL

## 2017-11-29 MED ORDER — FENTANYL CITRATE (PF) 100 MCG/2ML IJ SOLN
INTRAMUSCULAR | Status: AC | PRN
  Administered 2017-11-29 (×2): 25 ug via INTRAVENOUS

## 2017-11-29 MED ORDER — FENTANYL CITRATE (PF) 100 MCG/2ML IJ SOLN
INTRAMUSCULAR | Status: AC
  Filled 2017-11-29: qty 2

## 2017-11-29 NOTE — Progress Notes (Signed)
.   [facility] PROGRESS NOTE  Rebecca Porter:[id number] DOB: February 04, 1941 DOA: 11/26/2017 PCP: Rebecca Noon, MD   Narrative: 77 year old Caucasian female Recent admission to [facility] nephrolithiasis + sepsis-sounds like ESBL infection and had PICC line placed for 10 days ertapenem-[facility] and because of persistent back pain saw Rebecca Porter of [facility] 8/14-pain x-rays at the time suggested spondylolisthesis MRI eventually performed showed abscess and discitis L4/L5 exaggerated by movement  Admitted IR consulted-rpt MRI confirm phlegmon ID consulted and aware   A & Plan Epidural abscess-no antibiotics at this time until at least cultures from aspiration scheudled for 8/19 ID Rebecca Porter aware of Patient- White count not elevated Pain seems controlled at this time on current regimen  Urinary incontinence-continue Ditropan XL 15 mg--only take sprn--d/c the med off MAR  Constipation continue senna 1 tab daily  Bipolar continue Lexapro 10 daily, Remeron 15 at bedtime  CKD stage I-stable  Normocytic anemia  Hematuria? Dipstick no Bacteria--might need OP screening for the same  Mild constipation-she doesn't wish any new meds at this time     DVT prophylaxis: lovenox code Status: Presumed full code family Communication: None disposition Plan: Await IR input, then ID input Will need therapy evals in am post procedure--ready for d/c maybe 48-72 hr   Rebecca Au, MD  [facility] Direct contact: [phone] --Via amion app OR  --[email or web address]; password [email or web address]  7PM-7AM contact night coverage as above 11/29/2017, 12:28 PM  LOS: 3 days   Consultants:  IR  Ortho  ID  Procedures:  None yet  Antimicrobials:   NONE yet  Interval history/Subjective:  ++consitpated-awaiting stool No fever no chill  Pain controllable  Objective:  Vitals:  Vitals:   11/29/17 0431 11/29/17 0832  BP: 126/62 132/61  Pulse: 70 60  Resp: 18 18   Temp: 98.3 F (36.8 C) 99 F (37.2 C)  SpO2: 97% 98%    Exam:  No changes to exam today  . Awake alert in nad . Mallampati 2 . S1-S2 no murmur telemetry benign . Chest is clear . Abdomen soft nontender able to raise both legs off of bed   I have personally reviewed the following:   Labs:  Blood culture X2 pending  Imaging studies:  No  Medical tests:  n   Test discussed with performing physician:  n  Decision to obtain old records:  n  Review and summation of old records:  y  Scheduled Meds: . cholecalciferol  2,000 Units Oral Daily  . escitalopram  10 mg Oral Daily  . feeding supplement (GLUCERNA SHAKE)  120 mL Oral TID  . gabapentin  100 mg Oral TID  . loratadine  10 mg Oral Daily  . mirtazapine  15 mg Oral QHS  . oxyCODONE  10 mg Oral BID  . polyethylene glycol  17 g Oral Daily  . senna  1 tablet Oral Daily  . sodium chloride flush  3 mL Intravenous Q12H   Continuous Infusions: . sodium chloride      Principal Problem:   Epidural abscess Active Problems:   Obesity   LOS: 3 days

## 2017-11-29 NOTE — Consult Note (Signed)
Chief Complaint: Patient was seen in consultation today for L5-S1 disc aspiration Chief Complaint  Patient presents with  . Spinal Abscess   at the request of Dr Nada Libman  Referring Physician(s): Dr Minette Brine  Supervising Physician: Marybelle Killings  Patient Status: [facility] - In-pt  History of Present Illness: Rebecca Porter is a 77 y.o. female   Severe back pain Had a recent admission for Urosepsis; renal stones Treated with antibiotics 10 days Back pain never relieved-- worsening  MRI yesterday: MR SACRUM IMPRESSION: 1. 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 No other evidence for acute infection about the sacrum or within the visualized pelvis. 2. Large volume retained stool impacted within the rectal vault, suggesting constipation. 3. 12 mm simple left adnexal cyst, indeterminate. Further evaluation with nonemergent pelvic ultrasound recommended for further characterization.  Seen by Neurosurgery-- no role for surgery Requesting IR aspiration Dr Barbie Banner has reviewed imaging and approves procedure    Past Medical History:  Diagnosis Date  . Personal history of radiation therapy 2002    Past Surgical History:  Procedure Laterality Date  . BREAST BIOPSY Left    malignant stereo   . BREAST LUMPECTOMY Left 2002    Allergies: Penicillins; Macrobid [nitrofurantoin macrocrystal]; Protonix [pantoprazole sodium]; and Ciprofloxacin hcl  Medications: Prior to Admission medications   Medication Sig Start Date End Date Taking? Authorizing Provider  antiseptic oral rinse (BIOTENE) LIQD 10 mLs by Mouth Rinse route 3 (three) times daily as needed for dry mouth. Swish and spit   Yes [provider]  Cholecalciferol (VITAMIN D) 2000 units tablet Take 2,000 Units by mouth daily.   Yes [provider]  Cranberry 450 MG TABS Take 900 mg by mouth daily.   Yes [provider]  escitalopram (LEXAPRO) 10 MG tablet Take 10 mg by mouth daily.   Yes [provider]  gabapentin (NEURONTIN) 100 MG capsule Take 100 mg by mouth 3 (three) times daily.   Yes [provider]  loratadine (CLARITIN) 10 MG tablet Take 10 mg by mouth daily.   Yes [provider]  megestrol (MEGACE) 400 MG/10ML suspension Take 800 mg by mouth daily. 21 day course started 11/12/17   Yes [provider]  mirtazapine (REMERON) 15 MG tablet Take 15 mg by mouth at bedtime.   Yes [provider]  naproxen sodium (ALEVE) 220 MG tablet Take 220 mg by mouth at bedtime.   Yes [provider]  NUTRITIONAL SUPPLEMENT LIQD Take 60 mLs by mouth 3 (three) times daily. MedPass 2.0   Yes [provider]  oxybutynin (DITROPAN XL) 15 MG 24 hr tablet Take 15 mg by mouth daily.   Yes [provider]  oxyCODONE (OXY IR/ROXICODONE) 5 MG immediate release tablet Take 5 mg by mouth every 6 (six) hours as needed for breakthrough pain.   Yes [provider]  Oxycodone HCl 10 MG TABS Take 10 mg by mouth 2 (two) times daily.   Yes [provider]  senna (SENOKOT) 8.6 MG TABS tablet Take 1 tablet by mouth daily.   Yes [provider]  Sodium Chloride Flush (NORMAL SALINE FLUSH) 0.9 % SOLN Inject 10 mLs into the vein See admin instructions. "flush with 10 cc saline 12 post ABT"   Yes [provider]  ertapenem 1 g in sodium chloride 0.9 % 100 mL Inject 1 g into the vein daily. 12 day course completed  11/24/17    [provider]     Family History  Problem Relation Age of Onset  . Breast cancer Neg Hx     Social History   Socioeconomic History  . Marital status: Widowed    Spouse name: Not on file  . Number of children: Not on file  . Years of education: Not on file  . Highest education level: Not on file  Occupational History  . Not on file  Social Needs  . Financial resource strain: Not on  file  . Food insecurity:    Worry: Not on file    Inability: Not on file  . Transportation needs:    Medical: Not on file    Non-medical: Not on file  Tobacco Use  . Smoking status: Never Smoker  . Smokeless tobacco: Never Used  Substance and Sexual Activity  . Alcohol use: Never    Frequency: Never  . Drug use: Never  . Sexual activity: Not Currently  Lifestyle  . Physical activity:    Days per week: Not on file    Minutes per session: Not on file  . Stress: Not on file  Relationships  . Social connections:    Talks on phone: Not on file    Gets together: Not on file    Attends religious service: Not on file    Active member of club or organization: Not on file    Attends meetings of clubs or organizations: Not on file    Relationship status: Not on file  Other Topics Concern  . Not on file  Social History Narrative  . Not on file    Review of Systems: A 12 point ROS discussed and pertinent positives are indicated in the HPI above.  All other systems are negative.  Review of Systems  Constitutional: Positive for activity change. Negative for fatigue and fever.  Respiratory: Negative for shortness of breath.   Gastrointestinal: Negative for abdominal pain.  Musculoskeletal: Positive for back pain and gait problem.  Neurological: Negative for weakness.  Psychiatric/Behavioral: Negative for behavioral problems and confusion.    Vital Signs: BP 132/61 (BP Location: Right Arm)   Pulse 60   Temp 99 F (37.2 C) (Oral)   Resp 18   Ht 5\' 6"  (1.676 m)   Wt 259 lb 7.7 oz (117.7 kg)   SpO2 98%   BMI 41.88 kg/m   Physical Exam  Constitutional: She is oriented to person, place, and time.  Cardiovascular: Normal rate and regular rhythm.  Pulmonary/Chest: Effort normal and breath sounds normal.  Abdominal: Soft. Bowel sounds are normal.  Musculoskeletal: Normal range of motion.  Low back pain  Neurological: She is alert and oriented to person, place, and time.  Skin:  Skin is warm and dry.  Psychiatric: She has a normal mood and affect. Her behavior is normal. Judgment and thought content normal.  Nursing note and vitals reviewed.   Imaging: Mr Lumbar Spine W Wo Contrast  Result Date: 11/28/2017 EXAM: MRI LUMBAR SPINE WITHOUT AND WITH CONTRAST MRI SACRUM WITHOUT AND WITH CONTRAST TECHNIQUE: Multiplanar and multiecho pulse sequences of the lumbar spine and sacrum were obtained without and with intravenous contrast. CONTRAST:  93mL MULTIHANCE GADOBENATE DIMEGLUMINE 529 MG/ML IV SOLN COMPARISON:  None. FINDINGS: MRI LUMBAR SPINE FINDINGS: Segmentation: Normal segmentation. Lowest well-formed disc labeled the L5-S1 level. 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 Mild right convex scoliosis. Trace retrolisthesis of T12 on L1. Vertebrae: Vertebral body heights  maintained without evidence for acute or chronic fracture. Bone marrow signal intensity diffusely heterogeneous with extensive chronic reactive endplate changes seen at multiple levels. No worrisome osseous lesions. Abnormal edema with enhancement within the L5-S1 interspace, concerning for acute discitis. Marrow edema and enhancement within the adjacent L5 and S1 vertebral bodies compatible with associated osteomyelitis. Disc space height loss and mild endplate irregularity at this level. Mild associated paraspinous edema and phlegmon without discernible paraspinous abscess. Abnormal enhancing soft tissue within the ventral epidural space extending from L3-4 through S1 consistent with epidural phlegmon (series 20, image 8). Possible tiny 5 mm epidural abscess within the right ventral epidural space at the level of L4-5, best seen on sacral portion of this exam (series 14, image 9). Possible additional 9 mm epidural abscess at the left ventral epidural space/left lateral recess at the level of L5-S1 (series 14, image 12). These are more difficult to see on lumbar spine  portion of this study. Phlegmon and enhancement extends into the bilateral foramina at L4-5 and L5-S1. Faint edema with enhancement within the posterior aspect of the L4-5 disc suspicious for possible early discitis at this level as well (series 20, image 8). No other evidence for acute infection within the lumbar spine. No findings to suggest septic arthritis. Conus medullaris and cauda equina: Conus extends to the T12-L1 level. Conus and cauda equina appear normal. 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 Paraspinous soft tissues demonstrate no other acute abnormality. Scattered renal cysts noted bilaterally. Circumaortic left renal vein noted. Visualized soft tissues and visceral structures otherwise unremarkable. Disc levels: T11-12: Chronic degenerative intervertebral disc space narrowing with diffuse disc bulge, disc desiccation, and reactive endplate changes. Right-sided facet hypertrophy. No significant stenosis. T12-L1: Trace retrolisthesis. Diffuse disc bulge with disc desiccation and intervertebral disc space narrowing. Chronic reactive endplate changes. Mild facet hypertrophy. Resultant mild canal with moderate left lateral recess narrowing. Mild to moderate bilateral foraminal stenosis. L1-2: Chronic intervertebral disc space narrowing with diffuse disc bulge and disc desiccation. Reactive endplate changes. Moderate facet arthrosis. Moderate canal with left greater than right lateral recess narrowing. Mild left L1 foraminal stenosis. L2-3: Chronic intervertebral disc space narrowing with diffuse disc bulge and disc desiccation. Moderate facet hypertrophy. Mild spinal stenosis. Foramina remain patent. L3-4: Chronic intervertebral disc space narrowing with diffuse disc bulge. Moderate facet hypertrophy. Resultant mild spinal stenosis. Foramina remain patent. L4-5: Chronic intervertebral disc space narrowing with diffuse disc bulge. Possible early  changes of acute discitis at the posterior L4-5 disc. Abnormal enhancement within the ventral epidural space concerning for epidural phlegmon. Possible small 5 mm abscess within the right ventral epidural space as above. Moderate facet hypertrophy. Resultant mild canal with moderate bilateral lateral recess narrowing. Foramina remain patent. L5-S1: Chronic bilateral pars defects at L5 with 9 mm spondylolisthesis. Associated broad posterior pseudo disc bulge/uncovering. Changes related to osteomyelitis discitis. Epidural phlegmon and enhancement possible small 9 mm abscess within the left lateral recess as above. Moderate to advanced facet degeneration. Mild canal with bilateral lateral recess narrowing. Moderate bilateral L5 foraminal stenosis. MRI SACRUM FINDINGS: Visualized bone marrow signal intensity diffusely heterogeneous but within normal limits. No discrete or worrisome osseous lesions. No acute or chronic fracture. SI joints approximated and symmetric. No evidence for septic arthritis about either SI joint about the visualized hips or pubis symphysis. 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 Mild paraspinous edema and enhancement about the L5-S1 interspace extending inferiorly towards the presacral space. Associated  fairly symmetric edema and enhancement within the piriformis musculature bilaterally. Asymmetric edema and enhancement within the right gluteus medius musculature. Mild edema and enhancement within the posterior paraspinous musculature as well. No discrete abscess or drainable fluid collection. No free fluid within the pelvis. Bladder within normal limits. Uterus unremarkable. No made of a 12 mm simple left adnexal cyst. Large volume retained stool seen impacted within the rectal vault. Visualized bowels otherwise unremarkable. IMPRESSION: MR LUMBAR SPINE IMPRESSION: 1. 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 Associated epidural  phlegmon extending from L3-4 through L5-S1 with a few possible tiny micro epidural abscesses as above. No drainable paraspinous collection identified. 2. Subtle changes of possible early discitis involving the posterior L4-5 interspace. 3. 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 4. Additional moderate multilevel degenerative spondylolysis as detailed above. Please see above report for a full description of these findings. MR SACRUM IMPRESSION: 1. 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 No other evidence for acute infection about the sacrum or within the visualized pelvis. 2. Large volume retained stool impacted within the rectal vault, suggesting constipation. 3. 12 mm simple left adnexal cyst, indeterminate. Further evaluation with nonemergent pelvic ultrasound recommended for further characterization. Electronically Signed   By: Jeannine Boga M.D.   On: 11/28/2017 03:30   Mr Sacrum Si Joints W Wo Contrast  Result Date: 11/28/2017 EXAM: MRI LUMBAR SPINE WITHOUT AND WITH CONTRAST MRI SACRUM WITHOUT AND WITH CONTRAST TECHNIQUE: Multiplanar and multiecho pulse sequences of the lumbar spine and sacrum were obtained without and with intravenous contrast. CONTRAST:  35mL MULTIHANCE GADOBENATE DIMEGLUMINE 529 MG/ML IV SOLN COMPARISON:  None. FINDINGS: MRI LUMBAR SPINE FINDINGS: Segmentation: Normal segmentation. Lowest well-formed disc labeled the L5-S1 level. 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 Mild right convex scoliosis. Trace retrolisthesis of T12 on L1. Vertebrae: Vertebral body heights maintained without evidence for acute or chronic fracture. Bone marrow signal intensity diffusely heterogeneous with extensive chronic reactive endplate changes seen at multiple levels. No worrisome osseous  lesions. Abnormal edema with enhancement within the L5-S1 interspace, concerning for acute discitis. Marrow edema and enhancement within the adjacent L5 and S1 vertebral bodies compatible with associated osteomyelitis. Disc space height loss and mild endplate irregularity at this level. Mild associated paraspinous edema and phlegmon without discernible paraspinous abscess. Abnormal enhancing soft tissue within the ventral epidural space extending from L3-4 through S1 consistent with epidural phlegmon (series 20, image 8). Possible tiny 5 mm epidural abscess within the right ventral epidural space at the level of L4-5, best seen on sacral portion of this exam (series 14, image 9). Possible additional 9 mm epidural abscess at the left ventral epidural space/left lateral recess at the level of L5-S1 (series 14, image 12). These are more difficult to see on lumbar spine portion of this study. Phlegmon and enhancement extends into the bilateral foramina at L4-5 and L5-S1. Faint edema with enhancement within the posterior aspect of the L4-5 disc suspicious for possible early discitis at this level as well (series 20, image 8). No other evidence for acute infection within the lumbar spine. No findings to suggest septic arthritis. Conus medullaris and cauda equina: Conus extends to the T12-L1 level. Conus and cauda equina appear normal. 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 Paraspinous soft tissues demonstrate no other  acute abnormality. Scattered renal cysts noted bilaterally. Circumaortic left renal vein noted. Visualized soft tissues and visceral structures otherwise unremarkable. Disc levels: T11-12: Chronic degenerative intervertebral disc space narrowing with diffuse disc bulge, disc desiccation, and reactive endplate changes. Right-sided facet hypertrophy. No significant stenosis. T12-L1: Trace retrolisthesis. Diffuse disc bulge with disc desiccation and  intervertebral disc space narrowing. Chronic reactive endplate changes. Mild facet hypertrophy. Resultant mild canal with moderate left lateral recess narrowing. Mild to moderate bilateral foraminal stenosis. L1-2: Chronic intervertebral disc space narrowing with diffuse disc bulge and disc desiccation. Reactive endplate changes. Moderate facet arthrosis. Moderate canal with left greater than right lateral recess narrowing. Mild left L1 foraminal stenosis. L2-3: Chronic intervertebral disc space narrowing with diffuse disc bulge and disc desiccation. Moderate facet hypertrophy. Mild spinal stenosis. Foramina remain patent. L3-4: Chronic intervertebral disc space narrowing with diffuse disc bulge. Moderate facet hypertrophy. Resultant mild spinal stenosis. Foramina remain patent. L4-5: Chronic intervertebral disc space narrowing with diffuse disc bulge. Possible early changes of acute discitis at the posterior L4-5 disc. Abnormal enhancement within the ventral epidural space concerning for epidural phlegmon. Possible small 5 mm abscess within the right ventral epidural space as above. Moderate facet hypertrophy. Resultant mild canal with moderate bilateral lateral recess narrowing. Foramina remain patent. L5-S1: Chronic bilateral pars defects at L5 with 9 mm spondylolisthesis. Associated broad posterior pseudo disc bulge/uncovering. Changes related to osteomyelitis discitis. Epidural phlegmon and enhancement possible small 9 mm abscess within the left lateral recess as above. Moderate to advanced facet degeneration. Mild canal with bilateral lateral recess narrowing. Moderate bilateral L5 foraminal stenosis. MRI SACRUM FINDINGS: Visualized bone marrow signal intensity diffusely heterogeneous but within normal limits. No discrete or worrisome osseous lesions. No acute or chronic fracture. SI joints approximated and symmetric. No evidence for septic arthritis about either SI joint about the visualized hips or pubis  symphysis. 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 Mild paraspinous edema and enhancement about the L5-S1 interspace extending inferiorly towards the presacral space. Associated fairly symmetric edema and enhancement within the piriformis musculature bilaterally. Asymmetric edema and enhancement within the right gluteus medius musculature. Mild edema and enhancement within the posterior paraspinous musculature as well. No discrete abscess or drainable fluid collection. No free fluid within the pelvis. Bladder within normal limits. Uterus unremarkable. No made of a 12 mm simple left adnexal cyst. Large volume retained stool seen impacted within the rectal vault. Visualized bowels otherwise unremarkable. IMPRESSION: MR LUMBAR SPINE IMPRESSION: 1. 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 Associated epidural phlegmon extending from L3-4 through L5-S1 with a few possible tiny micro epidural abscesses as above. No drainable paraspinous collection identified. 2. Subtle changes of possible early discitis involving the posterior L4-5 interspace. 3. 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 4. Additional moderate multilevel degenerative spondylolysis as detailed above. Please see above report for a full description of these findings. MR SACRUM IMPRESSION: 1. 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 No other evidence for acute infection about the sacrum or within the visualized pelvis. 2. Large volume retained stool impacted within the rectal vault, suggesting constipation. 3. 12 mm simple left adnexal cyst, indeterminate. Further evaluation with nonemergent pelvic ultrasound recommended for further characterization. Electronically Signed   By: Jeannine Boga M.D.   On: 11/28/2017 03:30   Dg  Chest Portable 1 View  Result Date: 11/26/2017  CLINICAL DATA:  PICC line placement EXAM: PORTABLE CHEST 1 VIEW COMPARISON:  03/13/2009 FINDINGS: Right upper extremity catheter tip overlies the right brachiocephalic region. No acute opacity or pleural effusion. Heart size within normal limits. No pneumothorax. Aortic atherosclerosis. IMPRESSION: Tip of the right upper extremity catheter projects over the brachiocephalic region. Electronically Signed   By: Donavan Foil M.D.   On: 11/26/2017 18:40    Labs:  CBC: Recent Labs    11/26/17 1942 11/27/17 0620  WBC 11.5* 9.5  HGB 9.7* 9.5*  HCT 30.2* 29.4*  PLT 237 259    COAGS: Recent Labs    11/28/17 1256  INR 1.11    BMP: Recent Labs    11/26/17 1942 11/27/17 0620  NA 139 138  K 4.3 4.1  CL 108 108  CO2 25 25  GLUCOSE 100* 91  BUN 27* 24*  CALCIUM 9.1 9.0  CREATININE 0.77 0.77  GFRNONAA >60 >60  GFRAA >60 >60    LIVER FUNCTION TESTS: Recent Labs    11/26/17 1942  BILITOT 0.6  AST 16  ALT 21  ALKPHOS 145*  PROT 6.0*  ALBUMIN 2.4*    TUMOR MARKERS: No results for input(s): AFPTM, CEA, CA199, CHROMGRNA in the last 8760 hours.  Assessment and Plan:  Severe persistent back pain L5-S1 discitis Scheduled now for L5-S1 disc aspiration Risks and benefits discussed with the patient including, but not limited to bleeding, infection, damage to adjacent structures or low yield requiring additional tests.  All of the patient's questions were answered, patient is agreeable to proceed. Consent signed and in chart.  Thank you for this interesting consult.  I greatly enjoyed meeting FUSAKO TANABE and look forward to participating in their care.  A copy of this report was sent to the requesting provider on this date.  Electronically Signed: Lavonia Drafts, PA-C 11/29/2017, 8:54 AM   I spent a total of 40 Minutes    in face to face in clinical consultation, greater than 50% of which was counseling/coordinating care  for L5-S1 disc aspiration

## 2017-11-29 NOTE — H&P (Addendum)
[facility]    Date of Admission:  11/26/2017   Total days of antibiotics: 0               Reason for Consult: To determine need for antibiotic treatment.  Referring Provider: Dr. [name]   Assessment/Plan: Rebecca Porter is a 77 y.o. female with a recent admission for infected renal stones with resulting sepsis at [facility] and persistent LBP who presented to the ED 3 days ago with oupatient MRI showing possible discitis and epidural abscess and found to have acute osteomyelitis discitis with associated phlegmon on repeat MRI 8/13. Patient has remained afebrile throughout admission and found to have decreased sensation of LE but otherwise no other neurological deficits. Patient's osteomyelitis discitis and epidural abscess is likely secondary to hematogenous seeding of bacteria from bacteremia 2/2 infected renal stones. There may have also been retrograde dissemination of bacteria from kidney to spinal cord. Patient was completed a 14 day course of Ertapenam. Her blood and urine cultures on 7/27 grew ESBL-producing E.coli. Neurosurgery has been consulted and found no surgical indication given abscess is not compressing any nerve roots. IR consulted and scheduled patient for disc aspiration. Patient is currently afebrile and hemodynamically stable. Her most recent blood culture on 8/16 have been no growth to date. Given that she is clinically stable and does not have bacteremia, I believe that it is reasonable to wait for epidural aspiration and cultures before starting antibiotics. We will plan on placing a PICC line for parenteral antibiotics. We are also awaiting her 8/16 urine cultures.   Thank you so much for this interesting consult.  Principal Problem:   Epidural abscess Active Problems:   Osteomyelitis (HCC)   Discitis of lumbosacral region   UTI due to extended-spectrum beta lactamase (ESBL) producing Escherichia coli   Obesity   Multiple drug  allergies   Bacteremia   . cholecalciferol  2,000 Units Oral Daily  . escitalopram  10 mg Oral Daily  . feeding supplement (GLUCERNA SHAKE)  120 mL Oral TID  . gabapentin  100 mg Oral TID  . loratadine  10 mg Oral Daily  . mirtazapine  15 mg Oral QHS  . oxyCODONE  10 mg Oral BID  . polyethylene glycol  17 g Oral Daily  . senna  1 tablet Oral Daily  . sodium chloride flush  3 mL Intravenous Q12H    HPI: Rebecca Porter is a 77 y.o. female with a recent admission for infected kidney stone with resulting sepsis at [facility] and persistent LBP who presented to the ED 3 days ago with oupatient MRI showing possible discitis and epidural abscess.   Patient was hospitalized late July with sepsis likely secondary to nephrolithiasis. According to the outside ED note, she was "found down due to the pain." 7/27 CT abd pelvis showed 3-4 mm stone at the left ureteropelvic junction. No signs of renal abscess. She was started on Aztreotam in the ED because of reported drug intolerances (see bottom of HPI). Blood and urine cultures 7/27 grew ESBL- producing E. Coli. She then had a PICC line placed and completed 14 days of Ertapenam for urosepsis. She was discharged to a rehab center. She states that she continued to have persistent back pain and followed up with [facility] and had an MRI showing L5-S1 discitis with associated epidural abscess that contacts the thecal sac but does not cause significant stenosis. Patient was recommended to go to the  ED for further evaluation.   Neurosurgery was consulted and felt that surgery was not indicated given abscess is not compressing any nerve roots. Repeat MRI 8/18 showed findings consistent with acute osteomylitis discitis at L5-S1 with associated epidural phlegmon extending L3-S1. IR consulted and scheduled for disc aspiration.  Patient reports low back pain for 3 weeks. She also reports a several day history of bilateral lateral upper thigh numbness. She denies  weakness, changes in bowel and bladder. Denies fevers, chills, night sweats. Denies urinary symptoms. She denies IVDU and back surgery. She endorses a history of breast cancer (2002) without metastasis.  Patient reports an allergy to Penicillin and Ciprofloxacin (she endorses hives, chest tightness, and "inability to breath" with these two medications. She states that she has an allergy to Macrobid, stating "it just doesn't work for me".    Review of Systems: Review of Systems  Constitutional: Positive for fever. Negative for chills.  HENT: Negative for sinus pain and sore throat.   Respiratory: Negative for cough and shortness of breath.   Cardiovascular: Negative for chest pain, palpitations and leg swelling.  Gastrointestinal: Positive for nausea and vomiting. Negative for abdominal pain, constipation and diarrhea.  Genitourinary: Negative for dysuria, frequency, hematuria and urgency.  Musculoskeletal: Positive for back pain (LBP).  Skin: Negative for rash.  Neurological: Negative for dizziness, sensory change (Numbness to lateral aspect of upper thighs bilaterally.), weakness and headaches.  All other systems reviewed and are negative.   Past Medical History:  Diagnosis Date  . Personal history of radiation therapy 2002    Social History   Tobacco Use  . Smoking status: Never Smoker  . Smokeless tobacco: Never Used  Substance Use Topics  . Alcohol use: Never    Frequency: Never  . Drug use: Never    Family History  Problem Relation Age of Onset  . Breast cancer Neg Hx      Medications: I have reviewed the patient's current medications.  Abtx:  Anti-infectives (From admission, onward)   None        OBJECTIVE: Blood pressure 132/61, pulse 60, temperature 99 F (37.2 C), temperature source Oral, resp. rate 18, height 5\' 6"  (1.676 m), weight 117.7 kg, SpO2 98 %.  Physical Exam  Constitutional: She appears well-developed and well-nourished.  Very pleasant  female lying in bed in no acute distress.  HENT:  Head: Normocephalic and atraumatic.  Eyes: Pupils are equal, round, and reactive to light. EOM are normal.  Cardiovascular: Normal rate and regular rhythm.  Pulmonary/Chest: Effort normal and breath sounds normal.  Abdominal: Soft. Bowel sounds are normal.  Musculoskeletal:  No point tenderness to palpation of spine. Diffuse tenderness to lower paraspinal muscle bilaterally.   Neurological: She is alert.  Skin: Skin is warm and dry.  Psychiatric: She has a normal mood and affect. Her behavior is normal.    Lab Results Results for orders placed or performed during the hospital encounter of 11/26/17 (from the past 48 hour(s))  Protime-INR     Status: None   Collection Time: 11/28/17 12:56 PM  Result Value Ref Range   Prothrombin Time 14.2 11.4 - 15.2 seconds   INR 1.11     Comment: Performed at [facility], [place] [address], [place], [place] [address]  Urinalysis, Complete w Microscopic     Status: Abnormal   Collection Time: 11/28/17  4:35 PM  Result Value Ref Range   Color, Urine YELLOW YELLOW   APPearance HAZY (A) CLEAR   Specific  Gravity, Urine 1.008 1.005 - 1.030   pH 7.0 5.0 - 8.0   Glucose, UA NEGATIVE NEGATIVE mg/dL   Hgb urine dipstick LARGE (A) NEGATIVE   Bilirubin Urine NEGATIVE NEGATIVE   Ketones, ur NEGATIVE NEGATIVE mg/dL   Protein, ur NEGATIVE NEGATIVE mg/dL   Nitrite NEGATIVE NEGATIVE   Leukocytes, UA LARGE (A) NEGATIVE   RBC / HPF >50 (H) 0 - 5 RBC/hpf   WBC, UA 11-20 0 - 5 WBC/hpf   Bacteria, UA FEW (A) NONE SEEN   Squamous Epithelial / LPF 0-5 0 - 5   Mucus PRESENT    Hyaline Casts, UA PRESENT     Comment: Performed at [facility] [address], [place], [place] [address]      Component Value Date/Time   SDES BLOOD RIGHT ARM 11/26/2017 1943   SDES BLOOD LEFT ARM 11/26/2017 1943   SPECREQUEST  11/26/2017 1943    BOTTLES DRAWN AEROBIC AND ANAEROBIC Blood Culture adequate volume    SPECREQUEST  11/26/2017 1943    BOTTLES DRAWN AEROBIC AND ANAEROBIC Blood Culture adequate volume   CULT  11/26/2017 1943    NO GROWTH 2 DAYS Performed at [facility], [place] [address] [place], [place] [address]    CULT  11/26/2017 1943    NO GROWTH 2 DAYS Performed at [facility] [address] [place], [place] [address]    REPTSTATUS PENDING 11/26/2017 1943   REPTSTATUS PENDING 11/26/2017 1943   Mr Lumbar Spine W Wo Contrast  Result Date: 11/28/2017 EXAM: MRI LUMBAR SPINE WITHOUT AND WITH CONTRAST MRI SACRUM WITHOUT AND WITH CONTRAST TECHNIQUE: Multiplanar and multiecho pulse sequences of the lumbar spine and sacrum were obtained without and with intravenous contrast. CONTRAST:  75mL MULTIHANCE GADOBENATE DIMEGLUMINE 529 MG/ML IV SOLN COMPARISON:  None. FINDINGS: MRI LUMBAR SPINE FINDINGS: Segmentation: Normal segmentation. Lowest well-formed disc labeled the L5-S1 level. 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 Mild right convex scoliosis. Trace retrolisthesis of T12 on L1. Vertebrae: Vertebral body heights maintained without evidence for acute or chronic fracture. Bone marrow signal intensity diffusely heterogeneous with extensive chronic reactive endplate changes seen at multiple levels. No worrisome osseous lesions. Abnormal edema with enhancement within the L5-S1 interspace, concerning for acute discitis. Marrow edema and enhancement within the adjacent L5 and S1 vertebral bodies compatible with associated osteomyelitis. Disc space height loss and mild endplate irregularity at this level. Mild associated paraspinous edema and phlegmon without discernible paraspinous abscess. Abnormal enhancing soft tissue within the ventral epidural space extending from L3-4 through S1 consistent with epidural phlegmon (series 20, image 8). Possible tiny 5 mm epidural abscess within the right ventral epidural space at the level of L4-5, best seen on  sacral portion of this exam (series 14, image 9). Possible additional 9 mm epidural abscess at the left ventral epidural space/left lateral recess at the level of L5-S1 (series 14, image 12). These are more difficult to see on lumbar spine portion of this study. Phlegmon and enhancement extends into the bilateral foramina at L4-5 and L5-S1. Faint edema with enhancement within the posterior aspect of the L4-5 disc suspicious for possible early discitis at this level as well (series 20, image 8). No other evidence for acute infection within the lumbar spine. No findings to suggest septic arthritis. Conus medullaris and cauda equina: Conus extends to the T12-L1 level. Conus and cauda equina appear normal. Paraspinal and other soft tissues: Changes related to osteomyelitis discitis at  L5-S1 as above. Paraspinous soft tissues demonstrate no other acute abnormality. Scattered renal cysts noted bilaterally. Circumaortic left renal vein noted. Visualized soft tissues and visceral structures otherwise unremarkable. Disc levels: T11-12: Chronic degenerative intervertebral disc space narrowing with diffuse disc bulge, disc desiccation, and reactive endplate changes. Right-sided facet hypertrophy. No significant stenosis. T12-L1: Trace retrolisthesis. Diffuse disc bulge with disc desiccation and intervertebral disc space narrowing. Chronic reactive endplate changes. Mild facet hypertrophy. Resultant mild canal with moderate left lateral recess narrowing. Mild to moderate bilateral foraminal stenosis. L1-2: Chronic intervertebral disc space narrowing with diffuse disc bulge and disc desiccation. Reactive endplate changes. Moderate facet arthrosis. Moderate canal with left greater than right lateral recess narrowing. Mild left L1 foraminal stenosis. L2-3: Chronic intervertebral disc space narrowing with diffuse disc bulge and disc desiccation. Moderate facet hypertrophy. Mild spinal stenosis. Foramina remain patent. L3-4: Chronic  intervertebral disc space narrowing with diffuse disc bulge. Moderate facet hypertrophy. Resultant mild spinal stenosis. Foramina remain patent. L4-5: Chronic intervertebral disc space narrowing with diffuse disc bulge. Possible early changes of acute discitis at the posterior L4-5 disc. Abnormal enhancement within the ventral epidural space concerning for epidural phlegmon. Possible small 5 mm abscess within the right ventral epidural space as above. Moderate facet hypertrophy. Resultant mild canal with moderate bilateral lateral recess narrowing. Foramina remain patent. L5-S1: Chronic bilateral pars defects at L5 with 9 mm spondylolisthesis. Associated broad posterior pseudo disc bulge/uncovering. Changes related to osteomyelitis discitis. Epidural phlegmon and enhancement possible small 9 mm abscess within the left lateral recess as above. Moderate to advanced facet degeneration. Mild canal with bilateral lateral recess narrowing. Moderate bilateral L5 foraminal stenosis. MRI SACRUM FINDINGS: Visualized bone marrow signal intensity diffusely heterogeneous but within normal limits. No discrete or worrisome osseous lesions. No acute or chronic fracture. SI joints approximated and symmetric. No evidence for septic arthritis about either SI joint about the visualized hips or pubis symphysis. 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 Mild paraspinous edema and enhancement about the L5-S1 interspace extending inferiorly towards the presacral space. Associated fairly symmetric edema and enhancement within the piriformis musculature bilaterally. Asymmetric edema and enhancement within the right gluteus medius musculature. Mild edema and enhancement within the posterior paraspinous musculature as well. No discrete abscess or drainable fluid collection. No free fluid within the pelvis. Bladder within normal limits. Uterus unremarkable. No made of a 12 mm simple left adnexal cyst. Large volume retained  stool seen impacted within the rectal vault. Visualized bowels otherwise unremarkable. IMPRESSION: MR LUMBAR SPINE IMPRESSION: 1. 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 Associated epidural phlegmon extending from L3-4 through L5-S1 with a few possible tiny micro epidural abscesses as above. No drainable paraspinous collection identified. 2. Subtle changes of possible early discitis involving the posterior L4-5 interspace. 3. 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 4. Additional moderate multilevel degenerative spondylolysis as detailed above. Please see above report for a full description of these findings. MR SACRUM IMPRESSION: 1. 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 No other evidence for acute infection about the sacrum or within the visualized pelvis. 2. Large volume retained stool impacted within the rectal vault, suggesting constipation. 3. 12 mm simple left adnexal cyst, indeterminate. Further evaluation with nonemergent pelvic ultrasound recommended for further characterization. Electronically Signed   By: Jeannine Boga M.D.   On: 11/28/2017 03:30   Mr  Sacrum Si Joints W Wo Contrast  Result Date: 11/28/2017 EXAM: MRI LUMBAR SPINE WITHOUT AND WITH CONTRAST MRI SACRUM WITHOUT AND WITH CONTRAST TECHNIQUE: Multiplanar and multiecho pulse sequences of the lumbar spine and sacrum were obtained without and with intravenous contrast. CONTRAST:  34mL MULTIHANCE GADOBENATE DIMEGLUMINE 529 MG/ML IV SOLN COMPARISON:  None. FINDINGS: MRI LUMBAR SPINE FINDINGS: Segmentation: Normal segmentation. Lowest well-formed disc labeled the L5-S1 level. 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 Mild right convex scoliosis. Trace retrolisthesis of T12 on L1.  Vertebrae: Vertebral body heights maintained without evidence for acute or chronic fracture. Bone marrow signal intensity diffusely heterogeneous with extensive chronic reactive endplate changes seen at multiple levels. No worrisome osseous lesions. Abnormal edema with enhancement within the L5-S1 interspace, concerning for acute discitis. Marrow edema and enhancement within the adjacent L5 and S1 vertebral bodies compatible with associated osteomyelitis. Disc space height loss and mild endplate irregularity at this level. Mild associated paraspinous edema and phlegmon without discernible paraspinous abscess. Abnormal enhancing soft tissue within the ventral epidural space extending from L3-4 through S1 consistent with epidural phlegmon (series 20, image 8). Possible tiny 5 mm epidural abscess within the right ventral epidural space at the level of L4-5, best seen on sacral portion of this exam (series 14, image 9). Possible additional 9 mm epidural abscess at the left ventral epidural space/left lateral recess at the level of L5-S1 (series 14, image 12). These are more difficult to see on lumbar spine portion of this study. Phlegmon and enhancement extends into the bilateral foramina at L4-5 and L5-S1. Faint edema with enhancement within the posterior aspect of the L4-5 disc suspicious for possible early discitis at this level as well (series 20, image 8). No other evidence for acute infection within the lumbar spine. No findings to suggest septic arthritis. Conus medullaris and cauda equina: Conus extends to the T12-L1 level. Conus and cauda equina appear normal. 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 Paraspinous soft tissues demonstrate no other acute abnormality. Scattered renal cysts noted bilaterally. Circumaortic left renal vein noted. Visualized soft tissues and visceral structures otherwise unremarkable. Disc levels: T11-12: Chronic degenerative  intervertebral disc space narrowing with diffuse disc bulge, disc desiccation, and reactive endplate changes. Right-sided facet hypertrophy. No significant stenosis. T12-L1: Trace retrolisthesis. Diffuse disc bulge with disc desiccation and intervertebral disc space narrowing. Chronic reactive endplate changes. Mild facet hypertrophy. Resultant mild canal with moderate left lateral recess narrowing. Mild to moderate bilateral foraminal stenosis. L1-2: Chronic intervertebral disc space narrowing with diffuse disc bulge and disc desiccation. Reactive endplate changes. Moderate facet arthrosis. Moderate canal with left greater than right lateral recess narrowing. Mild left L1 foraminal stenosis. L2-3: Chronic intervertebral disc space narrowing with diffuse disc bulge and disc desiccation. Moderate facet hypertrophy. Mild spinal stenosis. Foramina remain patent. L3-4: Chronic intervertebral disc space narrowing with diffuse disc bulge. Moderate facet hypertrophy. Resultant mild spinal stenosis. Foramina remain patent. L4-5: Chronic intervertebral disc space narrowing with diffuse disc bulge. Possible early changes of acute discitis at the posterior L4-5 disc. Abnormal enhancement within the ventral epidural space concerning for epidural phlegmon. Possible small 5 mm abscess within the right ventral epidural space as above. Moderate facet hypertrophy. Resultant mild canal with moderate bilateral lateral recess narrowing. Foramina remain patent. L5-S1: Chronic bilateral pars defects at L5 with 9 mm spondylolisthesis. Associated broad posterior pseudo disc bulge/uncovering. Changes related to osteomyelitis discitis. Epidural phlegmon and enhancement possible small 9  mm abscess within the left lateral recess as above. Moderate to advanced facet degeneration. Mild canal with bilateral lateral recess narrowing. Moderate bilateral L5 foraminal stenosis. MRI SACRUM FINDINGS: Visualized bone marrow signal intensity diffusely  heterogeneous but within normal limits. No discrete or worrisome osseous lesions. No acute or chronic fracture. SI joints approximated and symmetric. No evidence for septic arthritis about either SI joint about the visualized hips or pubis symphysis. 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 Mild paraspinous edema and enhancement about the L5-S1 interspace extending inferiorly towards the presacral space. Associated fairly symmetric edema and enhancement within the piriformis musculature bilaterally. Asymmetric edema and enhancement within the right gluteus medius musculature. Mild edema and enhancement within the posterior paraspinous musculature as well. No discrete abscess or drainable fluid collection. No free fluid within the pelvis. Bladder within normal limits. Uterus unremarkable. No made of a 12 mm simple left adnexal cyst. Large volume retained stool seen impacted within the rectal vault. Visualized bowels otherwise unremarkable. IMPRESSION: MR LUMBAR SPINE IMPRESSION: 1. 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 Associated epidural phlegmon extending from L3-4 through L5-S1 with a few possible tiny micro epidural abscesses as above. No drainable paraspinous collection identified. 2. Subtle changes of possible early discitis involving the posterior L4-5 interspace. 3. 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 4. Additional moderate multilevel degenerative spondylolysis as detailed above. Please see above report for a full description of these findings. MR SACRUM IMPRESSION: 1. 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 No other evidence for acute infection about the sacrum or within the visualized pelvis. 2. Large volume retained stool impacted within the rectal vault, suggesting  constipation. 3. 12 mm simple left adnexal cyst, indeterminate. Further evaluation with nonemergent pelvic ultrasound recommended for further characterization. Electronically Signed   By: Jeannine Boga M.D.   On: 11/28/2017 03:30   Recent Results (from the past 240 hour(s))  Culture, blood (routine x 2)     Status: None (Preliminary result)   Collection Time: 11/26/17  7:43 PM  Result Value Ref Range Status   Specimen Description BLOOD RIGHT ARM  Final   Special Requests   Final    BOTTLES DRAWN AEROBIC AND ANAEROBIC Blood Culture adequate volume   Culture   Final    NO GROWTH 2 DAYS Performed at [facility], [place] [address], [place], [place] [id number]    Report Status PENDING  Incomplete  Culture, blood (routine x 2)     Status: None (Preliminary result)   Collection Time: 11/26/17  7:43 PM  Result Value Ref Range Status   Specimen Description BLOOD LEFT ARM  Final   Special Requests   Final    BOTTLES DRAWN AEROBIC AND ANAEROBIC Blood Culture adequate volume   Culture   Final    NO GROWTH 2 DAYS Performed at [facility], [address], [place], [place] [address]    Report Status PENDING  Incomplete  MRSA PCR Screening     Status: None   Collection Time: 11/27/17 12:05 AM  Result Value Ref Range Status   MRSA by PCR NEGATIVE NEGATIVE Final    Comment:        The GeneXpert MRSA Assay (FDA approved for NASAL specimens only), is one component of a comprehensive MRSA colonization surveillance program. It is not intended to diagnose MRSA  infection nor to guide or monitor treatment for MRSA infections. Performed at [facility], [place] [address], [place], [place] [address]     Microbiology: Recent Results (from the past 240 hour(s))  Culture, blood (routine x 2)     Status: None (Preliminary result)   Collection Time: 11/26/17  7:43 PM  Result Value Ref Range Status   Specimen Description BLOOD RIGHT ARM  Final   Special Requests   Final     BOTTLES DRAWN AEROBIC AND ANAEROBIC Blood Culture adequate volume   Culture   Final    NO GROWTH 2 DAYS Performed at [facility], [address], [place], [place] [address]    Report Status PENDING  Incomplete  Culture, blood (routine x 2)     Status: None (Preliminary result)   Collection Time: 11/26/17  7:43 PM  Result Value Ref Range Status   Specimen Description BLOOD LEFT ARM  Final   Special Requests   Final    BOTTLES DRAWN AEROBIC AND ANAEROBIC Blood Culture adequate volume   Culture   Final    NO GROWTH 2 DAYS Performed at [facility], [address], [place], [place] [address]    Report Status PENDING  Incomplete  MRSA PCR Screening     Status: None   Collection Time: 11/27/17 12:05 AM  Result Value Ref Range Status   MRSA by PCR NEGATIVE NEGATIVE Final    Comment:        The GeneXpert MRSA Assay (FDA approved for NASAL specimens only), is one component of a comprehensive MRSA colonization surveillance program. It is not intended to diagnose MRSA infection nor to guide or monitor treatment for MRSA infections. Performed at [facility], [place] [address], [place], [place] [address]     Radiographs and labs were personally reviewed by me.   Carroll Sage, MD  [phone] 11/29/2017, 9:59 AM

## 2017-11-29 NOTE — Procedures (Signed)
L5/S1 disk aspiration 1 cc cloudy serosanguinous fluid. EBL 0 Comp 0

## 2017-11-29 NOTE — Care Management Note (Signed)
Case Management Note  Patient Details  Name: NAYDENE KAMROWSKI MRN: [id number] Date of Birth: 12-19-1940  Subjective/Objective:       Pt admitted with epidural abscess. She has been at [facility] for IV antibiotics.  PCP: Dr Melford Aase Insurance: medicare             Action/Plan: Currently the plan is for patient to return to [facility] at d/c. CM following.   Expected Discharge Date:                  Expected Discharge Plan:  Skilled Nursing Facility  In-House Referral:  Clinical Social Work  Discharge planning Services     Post Acute Care Choice:    Choice offered to:     DME Arranged:    DME Agency:     HH Arranged:    [place] Agency:     Status of Service:  In process, will continue to follow  If discussed at Long Length of Stay Meetings, dates discussed:    Additional Comments:  Pollie Friar, RN 11/29/2017, 11:56 AM

## 2017-11-29 NOTE — Progress Notes (Signed)
Sent transport for pt. Transport returned call to this RN stating that pt had received laxative and wanted to wait until medication had  taken effect before coming down. Advised floor rn to call this rn at [phone] when pt was ready to come down for procedure and we will work her in as schedule allows.

## 2017-11-30 ENCOUNTER — Inpatient Hospital Stay: Payer: Self-pay

## 2017-11-30 DIAGNOSIS — M4626 Osteomyelitis of vertebra, lumbar region: Secondary | ICD-10-CM

## 2017-11-30 DIAGNOSIS — Z888 Allergy status to other drugs, medicaments and biological substances status: Secondary | ICD-10-CM

## 2017-11-30 MED ORDER — SODIUM CHLORIDE 0.9 % IV SOLN
1.0000 g | INTRAVENOUS | Status: DC
  Administered 2017-11-30 – 2017-12-01 (×2): 1000 mg via INTRAVENOUS
  Filled 2017-11-30 (×2): qty 1

## 2017-11-30 MED ORDER — OXYCODONE HCL 5 MG PO TABS
5.0000 mg | ORAL_TABLET | Freq: Four times a day (QID) | ORAL | Status: DC | PRN
  Administered 2017-11-30: 10 mg via ORAL
  Filled 2017-11-30: qty 2

## 2017-11-30 NOTE — Progress Notes (Signed)
PHARMACY CONSULT NOTE FOR:  OUTPATIENT  PARENTERAL ANTIBIOTIC THERAPY (OPAT)  Indication: ESBL E. Coli Discitis/Osteomyelitis/Epidural Abscess Regimen: Ertapenem 1 gm every 24 hours  End date: 01/11/18  IV antibiotic discharge orders are pended. To discharging provider:  please sign these orders via discharge navigator,  Select New Orders & click on the button choice - Manage This Unsigned Work.     Thank you for allowing pharmacy to be a part of this patient's care.  Susa Raring, PharmD, BCPS Infectious Diseases Clinical Pharmacist Phone: [phone] 11/30/2017, 3:37 PM

## 2017-11-30 NOTE — Evaluation (Signed)
Physical Therapy Evaluation Patient Details Name: Rebecca Porter MRN: [id number] DOB: Mar 07, 1941 Today's Date: 11/30/2017   History of Present Illness  77 yo female admitted from SNF with MRI(+) epidural abscess L4-5 and 8/19- aspiration of epidural abscess [id number] lumpectomy L obese, kidney stones 2019  Clinical Impression  Orders received for PT evaluation. Patient demonstrates deficits in functional mobility as indicated below. Will benefit from continued skilled PT to address deficits and maximize function. Will see as indicated and progress as tolerated.      Follow Up Recommendations SNF    Equipment Recommendations  (TBD)    Recommendations for Other Services       Precautions / Restrictions Precautions Precautions: Fall      Mobility  Bed Mobility Overal bed mobility: Needs Assistance Bed Mobility: Rolling;Sidelying to Sit;Sit to Sidelying Rolling: Supervision Sidelying to sit: Min assist     Sit to sidelying: Min assist General bed mobility comments: Rolling without difficulty, Vcs for positioning. Min assist to elevate trunk to upright with cues for spinal alignment. assist to elevate LEs back to bed  Transfers Overall transfer level: Needs assistance Equipment used: (back of chair) Transfers: Sit to/from Stand Sit to Stand: +2 physical assistance;Min assist         General transfer comment: Required max encouragement to come to standing, bilateral UE supported on chair back with bed height elevated  Ambulation/Gait             General Gait Details: patient deferred due to pain  Stairs            Wheelchair Mobility    Modified Rankin (Stroke Patients Only)       Balance Overall balance assessment: Needs assistance Sitting-balance support: Feet supported Sitting balance-Leahy Scale: Fair     Standing balance support: During functional activity Standing balance-Leahy Scale: Poor                                Pertinent Vitals/Pain Pain Assessment: 0-10 Pain Score: 9  Pain Location: lower back - outside of thighs are numb Pain Descriptors / Indicators: Sharp Pain Intervention(s): Monitored during session    Home Living Family/patient expects to be discharged to:: Skilled nursing facility                 Additional Comments: Pt recently admitted to SNF due to kidney stones and plans to return to SNF upon d/c     Prior Function Level of Independence: Needs assistance   Gait / Transfers Assistance Needed: rw - limited mobility due to pain prior to admission  ADL's / Homemaking Assistance Needed: (A) for all adls since SNF admission        Hand Dominance   Dominant Hand: Right    Extremity/Trunk Assessment   Upper Extremity Assessment Upper Extremity Assessment: Overall WFL for tasks assessed    Lower Extremity Assessment Lower Extremity Assessment: RLE deficits/detail;LLE deficits/detail RLE Deficits / Details: generalized weakness in Bilateral LEs RLE: Unable to fully assess due to pain RLE Sensation: WNL LLE Deficits / Details: generalized weakness in Bilateral LEs LLE: Unable to fully assess due to pain LLE Sensation: WNL    Cervical / Trunk Assessment Cervical / Trunk Assessment: Kyphotic  Communication   Communication: No difficulties  Cognition Arousal/Alertness: Awake/alert Behavior During Therapy: WFL for tasks assessed/performed Overall Cognitive Status: Within Functional Limits for tasks assessed  General Comments General comments (skin integrity, edema, etc.): hygiene and pericare performed    Exercises     Assessment/Plan    PT Assessment Patient needs continued PT services  PT Problem List Decreased strength;Decreased activity tolerance;Decreased balance;Decreased mobility;Pain       PT Treatment Interventions DME instruction;Gait training;Functional mobility training;Therapeutic  activities;Therapeutic exercise;Balance training;Patient/family education    PT Goals (Current goals can be found in the Care Plan section)  Acute Rehab PT Goals Patient Stated Goal: to return to [facility] stone PT Goal Formulation: With patient Time For Goal Achievement: 12/14/17 Potential to Achieve Goals: Good    Frequency Min 2X/week   Barriers to discharge        Co-evaluation               AM-PAC PT "6 Clicks" Daily Activity  Outcome Measure Difficulty turning over in bed (including adjusting bedclothes, sheets and blankets)?: A Little Difficulty moving from lying on back to sitting on the side of the bed? : Unable Difficulty sitting down on and standing up from a chair with arms (e.g., wheelchair, bedside commode, etc,.)?: Unable Help needed moving to and from a bed to chair (including a wheelchair)?: A Little Help needed walking in hospital room?: A Lot Help needed climbing 3-5 steps with a railing? : Total 6 Click Score: 11    End of Session Equipment Utilized During Treatment: Gait belt Activity Tolerance: Patient limited by pain Patient left: in bed;with call bell/phone within reach;with bed alarm set;with SCD's reapplied Nurse Communication: Mobility status PT Visit Diagnosis: Difficulty in walking, not elsewhere classified (R26.2)    Time: 7282-0601 PT Time Calculation (min) (ACUTE ONLY): 19 min   Charges:   PT Evaluation $PT Eval Moderate Complexity: [place], PT DPT  Board Certified Neurologic Specialist [place] 11/30/2017, 5:01 PM

## 2017-11-30 NOTE — Progress Notes (Signed)
RN discontinued Lexapro per patient's request.  Patient said she had not taken this med for years.

## 2017-11-30 NOTE — Progress Notes (Signed)
[facility]  Rebecca Porter PIR:[id number] DOB: 05-Nov-1940 DOA: 11/26/2017 PCP: Chesley Noon, MD   Narrative: 77 year old Caucasian female Recent admission to [facility] nephrolithiasis + sepsis-sounds like ESBL infection and had PICC line placed for 10 days ertapenem-[facility] and because of persistent back pain saw Dr. Maxie Better of [facility] 8/14-pain x-rays at the time suggested spondylolisthesis MRI eventually performed showed abscess and discitis L4/L5 exaggerated by movement  Admitted IR consulted-rpt MRI confirm phlegmon ID consulted recommendations Sounds like eventual skilled placement   A & Plan Epidural abscess-no antibiotics at this time until at least cultures from aspiration scheudled for 8/19 ID Dr. Megan Salon input appreciated-currently on ertapenem- White count not elevated Pain uncontrolled-adding ice packs heating packs increase OxyIR from 5 every 6 as needed to 5-10 every 6 as needed If prn in the morning we can consider change of gabapentin dose as well  Urinary incontinence-continue Ditropan XL 15 mg--only take sprn--d/c the med off MAR new changes  Constipation continue senna 1 tab daily use as needed monitor as increasing opiates  Bipolar continue Lexapro 10 daily, Remeron 15 at bedtime  CKD stage I-stable-periodic labs  Normocytic anemia  Hematuria? Dipstick no Bacteria--might need OP screening for the same  Mild constipation-she doesn't wish any new meds at this time     DVT prophylaxis: lovenox code Status: Presumed full code family Communication: None disposition Plan: Await IR input, then ID input Likely will need skilled placement ~ 48 hours and await cultures return   [name], MD  [facility] Direct contact: [phone] --Via amion app OR  --[email or web address]; password [email or web address]  7PM-7AM contact night coverage as above 11/30/2017, 10:25 AM  LOS: 4 days    Consultants:  IR  Ortho  ID  Procedures:  None yet  Antimicrobials:   NONE yet  Interval history/Subjective:  Got up for the first time this morning was max assist severe pain needed [name] No fever no chill   Objective:  Vitals:  Vitals:   11/30/17 0424 11/30/17 0842  BP: 108/69 132/64  Pulse: 69 80  Resp: 18 16  Temp: 98.9 F (37.2 C) 98.2 F (36.8 C)  SpO2: 90% 96%    Exam:   . Awake alert in nad and pain . Mallampati 2, no thyromegaly . S1-S2 no murmur telemetry benign . Chest is clear . Base of left leg off of bed   I have personally reviewed the following:   Labs:  Blood culture X2 pending  Wound culture pending from 8/19-shows moderate WBC  Imaging studies:  No  Medical tests:  IR drained serosanguineous fluid 8/19 from lower back  Test discussed with performing physician:  n  Decision to obtain old records:  n  Review and summation of old records:  y  Scheduled Meds: . cholecalciferol  2,000 Units Oral Daily  . escitalopram  10 mg Oral Daily  . feeding supplement (GLUCERNA SHAKE)  120 mL Oral TID  . gabapentin  100 mg Oral TID  . loratadine  10 mg Oral Daily  . mirtazapine  15 mg Oral QHS  . oxyCODONE  10 mg Oral BID  . polyethylene glycol  17 g Oral Daily  . senna  1 tablet Oral Daily  . sodium chloride flush  3 mL Intravenous Q12H   Continuous Infusions: . sodium chloride    . ertapenem      Principal Problem:   Epidural abscess Active Problems:   Obesity   Osteomyelitis ([place])  Discitis of lumbosacral region   Multiple drug allergies   UTI due to extended-spectrum beta lactamase (ESBL) producing Escherichia coli   Bacteremia   LOS: 4 days

## 2017-11-30 NOTE — Evaluation (Signed)
Occupational Therapy Evaluation Patient Details Name: Rebecca Porter MRN: [id number] DOB: 1940-10-02 Today's Date: 11/30/2017    History of Present Illness 77 yo female admitted from SNF with MRI(+) epidural abscess L4-5 and 8/19- aspiration of epidural abscess [id number] lumpectomy L obese, kidney stones 2019   Clinical Impression   PT admitted with L4-5 epidural abscess. Pt currently with functional limitiations due to the deficits listed below (see OT problem list). PTA pt was at [facility] and requires (A) for all adls due to pain. Pt currently able to complete BSC transfer total +2 min (A) with RW. Pt should be encouraged to transfer for toileting and limit purewick for PM hours for sleeping only.  Pt will benefit from skilled OT to increase their independence and safety with adls and balance to allow discharge SNF.     Follow Up Recommendations  SNF    Equipment Recommendations  3 in 1 bedside commode;Hospital bed    Recommendations for Other Services       Precautions / Restrictions Precautions Precautions: Fall Restrictions Weight Bearing Restrictions: No      Mobility Bed Mobility Overal bed mobility: Needs Assistance Bed Mobility: Supine to Sit     Supine to sit: Min assist     General bed mobility comments: pt requires incr time and HOB elevated to progress to L side of bed exit  Transfers Overall transfer level: Needs assistance Equipment used: Rolling walker (2 wheeled) Transfers: Sit to/from Stand Sit to Stand: +2 physical assistance;Min assist         General transfer comment: pt initially very painful first (A) from the bed but from the [facility] reports ease with transfer. pt reports the pain is better than it was prior to admission    Balance                                           ADL either performed or assessed with clinical judgement   ADL Overall ADL's : Needs assistance/impaired Eating/Feeding: Independent   Grooming:  Wash/dry face;Independent   Upper Body Bathing: Min guard   Lower Body Bathing: Maximal assistance   Upper Body Dressing : Min guard   Lower Body Dressing: Maximal assistance   Toilet Transfer: +2 for physical assistance;Minimal assistance Toilet Transfer Details (indicate cue type and reason): stand pivot to bsc- pt requesting bed pan and OT redirecting to [facility]. pt reluctantly agrees and voiding bowels. Pt after states "thank you! thank you for knowing how to do it and it not be painful" Toileting- Clothing Manipulation and Hygiene: +2 for physical assistance;Total assistance Toileting - Clothing Manipulation Details (indicate cue type and reason): pt static standing with total (A) for peri care. pt using RW to static stand one person (A)     Functional mobility during ADLs: Moderate assistance;Rolling walker General ADL Comments: pt requires mod (A) with RW due to narrowed space and (A) to move teh RW     Vision Baseline Vision/History: Wears glasses Wears Glasses: At all times       Perception     Praxis      Pertinent Vitals/Pain Pain Assessment: 0-10(none in laying or sitting) Pain Score: 8  Pain Location: lower back - outside of thighs are numb Pain Descriptors / Indicators: Sharp Pain Intervention(s): Monitored during session;Repositioned     Hand Dominance Right   Extremity/Trunk Assessment Upper Extremity Assessment Upper Extremity Assessment:  Overall [facility] for tasks assessed   Lower Extremity Assessment Lower Extremity Assessment: Defer to PT evaluation   Cervical / Trunk Assessment Cervical / Trunk Assessment: Kyphotic   Communication Communication Communication: No difficulties   Cognition Arousal/Alertness: Awake/alert Behavior During Therapy: WFL for tasks assessed/performed Overall Cognitive Status: Within Functional Limits for tasks assessed                                     General Comments       Exercises     Shoulder  Instructions      Home Living Family/patient expects to be discharged to:: Skilled nursing facility                                 Additional Comments: Pt recently admitted to SNF due to kidney stones and plans to return to SNF upon d/c       Prior Functioning/Environment Level of Independence: Needs assistance  Gait / Transfers Assistance Needed: rw - limited mobility due to pain prior to admission ADL's / Homemaking Assistance Needed: (A) for all adls since SNF admission            OT Problem List: Decreased strength;Decreased activity tolerance;Impaired balance (sitting and/or standing);Decreased safety awareness;Decreased knowledge of use of DME or AE;Decreased knowledge of precautions;Pain;Obesity      OT Treatment/Interventions: Self-care/ADL training;Therapeutic exercise;Energy conservation;DME and/or AE instruction;Therapeutic activities;Balance training;Patient/family education;Manual therapy    OT Goals(Current goals can be found in the care plan section) Acute Rehab OT Goals Patient Stated Goal: to return to [facility] stone OT Goal Formulation: With patient Time For Goal Achievement: 12/14/17 Potential to Achieve Goals: Good  OT Frequency: Min 2X/week   Barriers to D/C:            Co-evaluation              AM-PAC PT "6 Clicks" Daily Activity     Outcome Measure Help from another person eating meals?: None Help from another person taking care of personal grooming?: None Help from another person toileting, which includes using toliet, bedpan, or urinal?: A Lot Help from another person bathing (including washing, rinsing, drying)?: A Lot Help from another person to put on and taking off regular upper body clothing?: A Little Help from another person to put on and taking off regular lower body clothing?: A Lot 6 Click Score: 17   End of Session Equipment Utilized During Treatment: Gait belt;Rolling walker Nurse Communication: Mobility  status;Precautions  Activity Tolerance: Patient tolerated treatment well Patient left: in chair;with call bell/phone within reach  OT Visit Diagnosis: Unsteadiness on feet (R26.81);Muscle weakness (generalized) (M62.81)                Time: 4132-4401 OT Time Calculation (min): 26 min Charges:  OT General Charges $OT Visit: 1 Visit OT Evaluation $OT Eval Moderate Complexity: 1 Mod OT Treatments $Self Care/Home Management : 8-22 mins   Jeri Modena   OTR/L Pager: [phone] Office: [phone] .   Parke Poisson B 11/30/2017, 10:19 AM

## 2017-11-30 NOTE — NC FL2 (Signed)
[place] LEVEL OF CARE SCREENING TOOL     IDENTIFICATION  Patient Name: Rebecca Porter Birthdate: 1940-10-10 Sex: female Admission Date (Current Location): 11/26/2017  [facility] and [place] Number:  Herbalist and Address:  [facility], [place] [address], [place], [place] [address]      Provider Number: [id number]  Attending Physician Name and Address:  Nita Sells, MD  Relative Name and Phone Number:       Current Level of Care: Hospital Recommended Level of Care: [place] Prior Approval Number:    Date Approved/Denied:   PASRR Number:    Discharge Plan: SNF    Current Diagnoses: Patient Active Problem List   Diagnosis Date Noted  . Osteomyelitis ([place]) 11/29/2017  . 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 Acute kidney failure ([place]) 11/29/2017  . Multiple drug allergies 11/29/2017  . Depression 11/29/2017  . UTI due to extended-spectrum beta lactamase (ESBL) producing Escherichia coli 11/29/2017  . Bacteremia 11/29/2017  . 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 Obesity 11/26/2017    Orientation RESPIRATION BLADDER Height & Weight     Self, Time, Situation, Place  Normal Incontinent Weight: 259 lb 7.7 oz (117.7 kg) Height:  5\' 6"  (167.6 cm)  BEHAVIORAL SYMPTOMS/MOOD NEUROLOGICAL BOWEL NUTRITION STATUS      Continent Diet(regular)  AMBULATORY STATUS COMMUNICATION OF NEEDS Skin   Extensive Assist Verbally Surgical wounds(closed back incision; gauze, transparent dressing changed PRN)                       Personal Care Assistance Level of Assistance  Bathing, Feeding, Dressing Bathing Assistance: Maximum assistance Feeding assistance: Independent Dressing Assistance: Maximum assistance     Functional Limitations Info  Sight, Hearing, Speech Sight Info: Adequate Hearing Info: Adequate Speech Info: Adequate    SPECIAL CARE FACTORS FREQUENCY  PT (By licensed PT), OT (By licensed OT)      PT Frequency: 5x/wk OT Frequency: 5x/wk            Contractures Contractures Info: Not present    Additional Factors Info  Code Status, Allergies, Psychotropic Code Status Info: Full Allergies Info: Penicillins, Macrobid Nitrofurantoin Macrocrystal, Protonix Pantoprazole Sodium, Ciprofloxacin Hcl Psychotropic Info: Lexapro 10mg  daily; Remeron 15mg  daily at bed         Current Medications (11/30/2017):  This is the current hospital active medication list Current Facility-Administered Medications  Medication Dose Route Frequency Provider Last Rate Last Dose  . 0.9 %  sodium chloride infusion  250 mL Intravenous PRN Phillips Grout, MD      . acetaminophen (TYLENOL) tablet 650 mg  650 mg Oral Q6H PRN Phillips Grout, MD       Or  . acetaminophen (TYLENOL) suppository 650 mg  650 mg Rectal Q6H PRN Phillips Grout, MD      . antiseptic oral rinse (BIOTENE) solution 10 mL  10 mL Mouth Rinse TID PRN Phillips Grout, MD      . cholecalciferol (VITAMIN D) tablet 2,000 Units  2,000 Units Oral Daily Phillips Grout, MD   2,000 Units at 11/30/17 [phone]  . ertapenem (INVANZ) 1,000 mg in sodium chloride 0.9 % 100 mL IVPB  1 g Intravenous Q24H Michel Bickers, MD 200 mL/hr at 11/30/17 1158 1,000 mg at 11/30/17 1158  . escitalopram (LEXAPRO) tablet 10 mg  10 mg Oral Daily Phillips Grout, MD   10 mg at 11/29/17 0917  . feeding supplement (  GLUCERNA SHAKE) (GLUCERNA SHAKE) liquid 120 mL  120 mL Oral TID Phillips Grout, MD   120 mL at 11/30/17 1015  . gabapentin (NEURONTIN) capsule 100 mg  100 mg Oral TID Phillips Grout, MD   100 mg at 11/30/17 0953  . ketorolac (TORADOL) 30 MG/ML injection 30 mg  30 mg Intravenous Q6H PRN Phillips Grout, MD      . loratadine (CLARITIN) tablet 10 mg  10 mg Oral Daily Phillips Grout, MD   10 mg at 11/29/17 0917  . mirtazapine (REMERON) tablet 15 mg  15 mg Oral QHS Derrill Kay A, MD      . ondansetron [facility]) tablet 4 mg  4 mg Oral Q6H PRN Phillips Grout, MD        Or  . ondansetron (ZOFRAN) injection 4 mg  4 mg Intravenous Q6H PRN Derrill Kay A, MD      . oxyCODONE (Oxy IR/ROXICODONE) immediate release tablet 5-10 mg  5-10 mg Oral Q6H PRN Nita Sells, MD      . oxyCODONE (OXYCONTIN) 12 hr tablet 10 mg  10 mg Oral BID Phillips Grout, MD   10 mg at 11/30/17 0954  . polyethylene glycol (MIRALAX / GLYCOLAX) packet 17 g  17 g Oral Daily Nita Sells, MD   17 g at 11/29/17 0917  . senna (SENOKOT) tablet 8.6 mg  1 tablet Oral Daily Derrill Kay A, MD   8.6 mg at 11/30/17 0953  . sodium chloride flush (NS) 0.9 % injection 3 mL  3 mL Intravenous Q12H Derrill Kay A, MD   3 mL at 11/30/17 0958  . sodium chloride flush (NS) 0.9 % injection 3 mL  3 mL Intravenous PRN Phillips Grout, MD         Discharge Medications: Please see discharge summary for a list of discharge medications.  Relevant Imaging Results:  Relevant Lab Results:   Additional Information SS#: [id number]  Geralynn Ochs, LCSW

## 2017-11-30 NOTE — Consult Note (Addendum)
[name] for Infectious Disease  Date of Admission:  11/26/2017   Total days of antibiotics: 1        Day 1 Ertapenam  ASSESSMENT: Rebecca Porter is a 77 y.o. female with a recent admission for infected renal stones with resulting sepsis at [facility] and persistent LBP who found to have acute osteomyelitis discitis with associated phlegmon. Patient's osteomyelitis discitis and epidural abscess is likely secondary to hematogenous seeding of bacteria from bacteremia 2/2 infected renal stones. There may have also been retrograde dissemination of bacteria from kidney to spinal cord. Patient was completed a 14 day course of Ertapenam (November 26, 2017). Her blood and urine cultures on 7/27 grew ESBL-producing E.coli. Neurosurgery has been consulted and found no surgical indication given abscess is not compressing any nerve roots. IR performed disc aspiration on 8/19. Intervertebral disc culture still pending. Her most recent blood culture on 8/16 have been no growth to date.  Patient is currently afebrile and hemodynamically stable. We will plan on placing a PICC line today and begin IV Ertapenam. Additionally, 8/16 urine cultures grew "multiple species". I do not believe that repeating a urine culture would change medical management and will defer at this time.   PLAN: 1. Epidural Abscess, Osteomyelitis Discitis -  Ertapenam 1 g every 24 hours ordered; Duration will include a total of 6 weeks. - Await intervertebral disc culture and if appropriate will adjust antibiotic treatment. - Defer repeat urine culture, as this will likely not change medical management.  - OPAT order placed, instructions below  Culture Result:  - 7/27 Blood culture: ESBL-producing E.coli - 8/16 Blood Culture: NGTD - 8/19 Intervertebral disc culture: pending  Allergies  Allergen Reactions  . Penicillins Anaphylaxis and Hives    Has patient had a PCN reaction causing immediate rash, facial/tongue/throat  swelling, SOB or lightheadedness with hypotension: Yes Has patient had a PCN reaction causing severe rash involving mucus membranes or skin necrosis: No Has patient had a PCN reaction that required hospitalization: unknown Has patient had a PCN reaction occurring within the last 10 years: No If all of the above answers are "NO", then may proceed with Cephalosporin use.  Rebecca Porter [Nitrofurantoin Macrocrystal] Other (See Comments)    Unknown reaction  . Protonix [Pantoprazole Sodium] Other (See Comments)    Unknown reaction  . Ciprofloxacin Hcl Swelling and Rash    Throat swelling    OPAT Orders Discharge antibiotics: Ertapenam 1 g IV every 24 hours  Duration: 6 weeks  End Date: January 11, 2018  [facility] Care Per Protocol:  Labs weekly while on IV antibiotics: _X_ CBC with differential __ BMP _X_ CMP __ CRP __ ESR __ Vancomycin trough  _X_ Please pull PIC at completion of IV antibiotics __ Please leave PIC in place until doctor has seen patient or been notified  Fax weekly labs to [phone]  Clinic Follow Up Appt: Follow-up with Dr. Megan Porter in 4 weeks   Principal Problem:   Epidural abscess Active Problems:   Osteomyelitis ([place])   Discitis of lumbosacral region   UTI due to extended-spectrum beta lactamase (ESBL) producing Escherichia coli   Multiple drug allergies   Bacteremia   Obesity   Scheduled Meds: . cholecalciferol  2,000 Units Oral Daily  . escitalopram  10 mg Oral Daily  . feeding supplement (GLUCERNA SHAKE)  120 mL Oral TID  . gabapentin  100 mg Oral TID  . loratadine  10 mg Oral Daily  .  mirtazapine  15 mg Oral QHS  . oxyCODONE  10 mg Oral BID  . polyethylene glycol  17 g Oral Daily  . senna  1 tablet Oral Daily  . sodium chloride flush  3 mL Intravenous Q12H   Continuous Infusions: . sodium chloride     PRN Meds:.sodium chloride, acetaminophen **OR** acetaminophen, antiseptic oral rinse, ketorolac, ondansetron **OR** ondansetron (ZOFRAN)  IV, oxyCODONE, sodium chloride flush   SUBJECTIVE: Patient reports minimal pain immediately after surgery. She states that this morning she began to have severe lower back pain after working with occupational therapy. She has since been prescribed Oxycodone by primary team which she states has helped with her lower back pain. She does not have any other acute complaints.   Review of Systems: Review of Systems  Constitutional: Negative for fever.  Respiratory: Negative for cough and shortness of breath.   Cardiovascular: Negative for chest pain.  Gastrointestinal: Negative for abdominal pain, nausea and vomiting.  Genitourinary: Negative for dysuria, frequency and urgency.  Musculoskeletal: Positive for back pain (Lower back pain).  All other systems reviewed and are negative.   Allergies  Allergen Reactions  . Penicillins Anaphylaxis and Hives    Has patient had a PCN reaction causing immediate rash, facial/tongue/throat swelling, SOB or lightheadedness with hypotension: Yes Has patient had a PCN reaction causing severe rash involving mucus membranes or skin necrosis: No Has patient had a PCN reaction that required hospitalization: unknown Has patient had a PCN reaction occurring within the last 10 years: No If all of the above answers are "NO", then may proceed with Cephalosporin use.  Rebecca Porter [Nitrofurantoin Macrocrystal] Other (See Comments)    Unknown reaction  . Protonix [Pantoprazole Sodium] Other (See Comments)    Unknown reaction  . Ciprofloxacin Hcl Swelling and Rash    Throat swelling    OBJECTIVE: Vitals:   11/29/17 1608 11/29/17 1937 11/30/17 0026 11/30/17 0424  BP: (!) 148/61 (!) 116/56 108/66 108/69  Pulse: (!) 59 86 76 69  Resp: 18 16 18 18   Temp: 98.2 F (36.8 C) 98.4 F (36.9 C) 98.8 F (37.1 C) 98.9 F (37.2 C)  TempSrc: Oral Oral Oral Oral  SpO2: 100% 97% 97% 90%  Weight:      Height:       Body mass index is 41.88 kg/m.  Physical Exam    Constitutional: She appears well-developed and well-nourished.  Very pleasant female lying in bed in no acute distress.  HENT:  Head: Normocephalic and atraumatic.  Eyes: EOM are normal.  Cardiovascular: Normal rate and regular rhythm.  Pulmonary/Chest: Effort normal and breath sounds normal.  Abdominal: Soft. Non-tender to palpation. Bowel sounds are normal. Neurological: She is alert.  Skin: Skin is warm and dry.  Psychiatric: She has a normal mood and affect. Her behavior is normal.    Lab Results Lab Results  Component Value Date   WBC 9.5 11/27/2017   HGB 9.5 (L) 11/27/2017   HCT 29.4 (L) 11/27/2017   MCV 93.9 11/27/2017   PLT 259 11/27/2017    Lab Results  Component Value Date   CREATININE 0.77 11/27/2017   BUN 24 (H) 11/27/2017   NA 138 11/27/2017   K 4.1 11/27/2017   CL 108 11/27/2017   CO2 25 11/27/2017    Lab Results  Component Value Date   ALT 21 11/26/2017   AST 16 11/26/2017   ALKPHOS 145 (H) 11/26/2017   BILITOT 0.6 11/26/2017     Microbiology: Recent Results (from the  past 240 hour(s))  Culture, blood (routine x 2)     Status: None (Preliminary result)   Collection Time: 11/26/17  7:43 PM  Result Value Ref Range Status   Specimen Description BLOOD RIGHT ARM  Final   Special Requests   Final    BOTTLES DRAWN AEROBIC AND ANAEROBIC Blood Culture adequate volume   Culture   Final    NO GROWTH 3 DAYS Performed at [facility], [address] [place], [place] [address]    Report Status PENDING  Incomplete  Culture, blood (routine x 2)     Status: None (Preliminary result)   Collection Time: 11/26/17  7:43 PM  Result Value Ref Range Status   Specimen Description BLOOD LEFT ARM  Final   Special Requests   Final    BOTTLES DRAWN AEROBIC AND ANAEROBIC Blood Culture adequate volume   Culture   Final    NO GROWTH 3 DAYS Performed at [facility], [address], [place], [place] [address]    Report Status PENDING  Incomplete  MRSA PCR  Screening     Status: None   Collection Time: 11/27/17 12:05 AM  Result Value Ref Range Status   MRSA by PCR NEGATIVE NEGATIVE Final    Comment:        The GeneXpert MRSA Assay (FDA approved for NASAL specimens only), is one component of a comprehensive MRSA colonization surveillance program. It is not intended to diagnose MRSA infection nor to guide or monitor treatment for MRSA infections. Performed at [facility], [place] [address] [place], [place] [address]   Urine Culture     Status: Abnormal   Collection Time: 11/28/17 12:09 PM  Result Value Ref Range Status   Specimen Description URINE, CLEAN CATCH  Final   Special Requests   Final    NONE Performed at [facility], [place] [address], [place], [place] [address]    Culture MULTIPLE SPECIES PRESENT, SUGGEST RECOLLECTION (A)  Final   Report Status 11/29/2017 FINAL  Final  Aerobic/Anaerobic Culture (surgical/deep wound)     Status: None (Preliminary result)   Collection Time: 11/29/17  3:25 PM  Result Value Ref Range Status   Specimen Description WOUND  Final   Special Requests INTERVERTEBRAL DISC ASP  Final   Gram Stain   Final    MODERATE WBC PRESENT, PREDOMINANTLY PMN NO ORGANISMS SEEN Performed at [facility], [place] [address] [place],  [address]    Culture PENDING  Incomplete   Report Status PENDING  Incomplete    Carroll Sage, MD [phone] pager 11/30/2017, 8:23 AM

## 2017-12-01 DIAGNOSIS — R509 Fever, unspecified: Secondary | ICD-10-CM | POA: Diagnosis not present

## 2017-12-01 DIAGNOSIS — N179 Acute kidney failure, unspecified: Secondary | ICD-10-CM | POA: Diagnosis not present

## 2017-12-01 DIAGNOSIS — R54 Age-related physical debility: Secondary | ICD-10-CM | POA: Diagnosis not present

## 2017-12-01 DIAGNOSIS — N201 Calculus of ureter: Secondary | ICD-10-CM | POA: Diagnosis not present

## 2017-12-01 DIAGNOSIS — G894 Chronic pain syndrome: Secondary | ICD-10-CM | POA: Diagnosis not present

## 2017-12-01 DIAGNOSIS — R2689 Other abnormalities of gait and mobility: Secondary | ICD-10-CM | POA: Diagnosis not present

## 2017-12-01 DIAGNOSIS — F339 Major depressive disorder, recurrent, unspecified: Secondary | ICD-10-CM | POA: Diagnosis not present

## 2017-12-01 DIAGNOSIS — K219 Gastro-esophageal reflux disease without esophagitis: Secondary | ICD-10-CM | POA: Diagnosis not present

## 2017-12-01 DIAGNOSIS — M4316 Spondylolisthesis, lumbar region: Secondary | ICD-10-CM | POA: Diagnosis not present

## 2017-12-01 DIAGNOSIS — Z6834 Body mass index (BMI) 34.0-34.9, adult: Secondary | ICD-10-CM | POA: Diagnosis not present

## 2017-12-01 DIAGNOSIS — Z6836 Body mass index (BMI) 36.0-36.9, adult: Secondary | ICD-10-CM | POA: Diagnosis not present

## 2017-12-01 DIAGNOSIS — G061 Intraspinal abscess and granuloma: Secondary | ICD-10-CM | POA: Diagnosis not present

## 2017-12-01 DIAGNOSIS — M869 Osteomyelitis, unspecified: Secondary | ICD-10-CM | POA: Diagnosis not present

## 2017-12-01 DIAGNOSIS — Z881 Allergy status to other antibiotic agents status: Secondary | ICD-10-CM | POA: Diagnosis not present

## 2017-12-01 DIAGNOSIS — N209 Urinary calculus, unspecified: Secondary | ICD-10-CM | POA: Diagnosis not present

## 2017-12-01 DIAGNOSIS — M4646 Discitis, unspecified, lumbar region: Secondary | ICD-10-CM | POA: Diagnosis not present

## 2017-12-01 DIAGNOSIS — Z880 Allergy status to penicillin: Secondary | ICD-10-CM | POA: Diagnosis not present

## 2017-12-01 DIAGNOSIS — Z853 Personal history of malignant neoplasm of breast: Secondary | ICD-10-CM | POA: Diagnosis not present

## 2017-12-01 DIAGNOSIS — N200 Calculus of kidney: Secondary | ICD-10-CM | POA: Diagnosis not present

## 2017-12-01 DIAGNOSIS — R3981 Functional urinary incontinence: Secondary | ICD-10-CM | POA: Diagnosis not present

## 2017-12-01 DIAGNOSIS — I447 Left bundle-branch block, unspecified: Secondary | ICD-10-CM | POA: Diagnosis not present

## 2017-12-01 DIAGNOSIS — A419 Sepsis, unspecified organism: Secondary | ICD-10-CM | POA: Diagnosis not present

## 2017-12-01 DIAGNOSIS — Z95828 Presence of other vascular implants and grafts: Secondary | ICD-10-CM | POA: Diagnosis not present

## 2017-12-01 DIAGNOSIS — F320 Major depressive disorder, single episode, mild: Secondary | ICD-10-CM | POA: Diagnosis not present

## 2017-12-01 DIAGNOSIS — Z79891 Long term (current) use of opiate analgesic: Secondary | ICD-10-CM | POA: Diagnosis not present

## 2017-12-01 DIAGNOSIS — F319 Bipolar disorder, unspecified: Secondary | ICD-10-CM | POA: Diagnosis not present

## 2017-12-01 DIAGNOSIS — R945 Abnormal results of liver function studies: Secondary | ICD-10-CM | POA: Diagnosis not present

## 2017-12-01 DIAGNOSIS — R600 Localized edema: Secondary | ICD-10-CM | POA: Diagnosis not present

## 2017-12-01 DIAGNOSIS — Z79899 Other long term (current) drug therapy: Secondary | ICD-10-CM | POA: Diagnosis not present

## 2017-12-01 DIAGNOSIS — R682 Dry mouth, unspecified: Secondary | ICD-10-CM | POA: Diagnosis not present

## 2017-12-01 DIAGNOSIS — D649 Anemia, unspecified: Secondary | ICD-10-CM | POA: Diagnosis not present

## 2017-12-01 DIAGNOSIS — M6281 Muscle weakness (generalized): Secondary | ICD-10-CM | POA: Diagnosis not present

## 2017-12-01 DIAGNOSIS — M4647 Discitis, unspecified, lumbosacral region: Secondary | ICD-10-CM | POA: Diagnosis not present

## 2017-12-01 DIAGNOSIS — N3281 Overactive bladder: Secondary | ICD-10-CM | POA: Diagnosis not present

## 2017-12-01 DIAGNOSIS — M4626 Osteomyelitis of vertebra, lumbar region: Secondary | ICD-10-CM | POA: Diagnosis not present

## 2017-12-01 DIAGNOSIS — G062 Extradural and subdural abscess, unspecified: Secondary | ICD-10-CM | POA: Diagnosis not present

## 2017-12-01 DIAGNOSIS — N390 Urinary tract infection, site not specified: Secondary | ICD-10-CM | POA: Diagnosis not present

## 2017-12-01 DIAGNOSIS — I071 Rheumatic tricuspid insufficiency: Secondary | ICD-10-CM | POA: Diagnosis not present

## 2017-12-01 DIAGNOSIS — Z01810 Encounter for preprocedural cardiovascular examination: Secondary | ICD-10-CM | POA: Diagnosis not present

## 2017-12-01 DIAGNOSIS — F329 Major depressive disorder, single episode, unspecified: Secondary | ICD-10-CM | POA: Diagnosis not present

## 2017-12-01 DIAGNOSIS — B9629 Other Escherichia coli [E. coli] as the cause of diseases classified elsewhere: Secondary | ICD-10-CM | POA: Diagnosis not present

## 2017-12-01 DIAGNOSIS — Z87440 Personal history of urinary (tract) infections: Secondary | ICD-10-CM | POA: Diagnosis not present

## 2017-12-01 DIAGNOSIS — Z111 Encounter for screening for respiratory tuberculosis: Secondary | ICD-10-CM | POA: Diagnosis not present

## 2017-12-01 DIAGNOSIS — Z87442 Personal history of urinary calculi: Secondary | ICD-10-CM | POA: Diagnosis not present

## 2017-12-01 DIAGNOSIS — M4627 Osteomyelitis of vertebra, lumbosacral region: Secondary | ICD-10-CM | POA: Diagnosis not present

## 2017-12-01 DIAGNOSIS — W19XXXD Unspecified fall, subsequent encounter: Secondary | ICD-10-CM | POA: Diagnosis not present

## 2017-12-01 DIAGNOSIS — Z13220 Encounter for screening for lipoid disorders: Secondary | ICD-10-CM | POA: Diagnosis not present

## 2017-12-01 DIAGNOSIS — R0609 Other forms of dyspnea: Secondary | ICD-10-CM | POA: Diagnosis not present

## 2017-12-01 DIAGNOSIS — Z888 Allergy status to other drugs, medicaments and biological substances status: Secondary | ICD-10-CM | POA: Diagnosis not present

## 2017-12-01 DIAGNOSIS — R52 Pain, unspecified: Secondary | ICD-10-CM | POA: Diagnosis not present

## 2017-12-01 DIAGNOSIS — D72829 Elevated white blood cell count, unspecified: Secondary | ICD-10-CM | POA: Diagnosis not present

## 2017-12-01 DIAGNOSIS — D696 Thrombocytopenia, unspecified: Secondary | ICD-10-CM | POA: Diagnosis not present

## 2017-12-01 DIAGNOSIS — R030 Elevated blood-pressure reading, without diagnosis of hypertension: Secondary | ICD-10-CM | POA: Diagnosis not present

## 2017-12-01 DIAGNOSIS — Z01812 Encounter for preprocedural laboratory examination: Secondary | ICD-10-CM | POA: Diagnosis not present

## 2017-12-01 DIAGNOSIS — N189 Chronic kidney disease, unspecified: Secondary | ICD-10-CM | POA: Diagnosis not present

## 2017-12-01 DIAGNOSIS — E669 Obesity, unspecified: Secondary | ICD-10-CM | POA: Diagnosis not present

## 2017-12-01 DIAGNOSIS — K5900 Constipation, unspecified: Secondary | ICD-10-CM | POA: Diagnosis not present

## 2017-12-01 DIAGNOSIS — F4323 Adjustment disorder with mixed anxiety and depressed mood: Secondary | ICD-10-CM | POA: Diagnosis not present

## 2017-12-01 LAB — CULTURE, BLOOD (ROUTINE X 2)
Culture: NO GROWTH
Culture: NO GROWTH
Special Requests: ADEQUATE
Special Requests: ADEQUATE

## 2017-12-01 MED ORDER — OXYCODONE HCL 10 MG PO TABS: 10.0000 mg | ORAL_TABLET | Freq: Two times a day (BID) | ORAL | 0 refills | Status: DC

## 2017-12-01 MED ORDER — SODIUM CHLORIDE 0.9% FLUSH
10.0000 mL | Freq: Two times a day (BID) | INTRAVENOUS | Status: DC
  Administered 2017-12-01: 10 mL

## 2017-12-01 MED ORDER — SODIUM CHLORIDE 0.9% FLUSH: 10.0000 mL | INTRAVENOUS | Status: DC | PRN

## 2017-12-01 MED ORDER — ERTAPENEM IV (FOR PTA / DISCHARGE USE ONLY): 1.0000 g | INTRAVENOUS | 0 refills | Status: DC

## 2017-12-01 MED ORDER — OXYCODONE HCL 5 MG PO TABS: 10.0000 mg | ORAL_TABLET | Freq: Four times a day (QID) | ORAL | 0 refills | Status: AC | PRN

## 2017-12-01 MED ORDER — MIRTAZAPINE 15 MG PO TABS: 15.0000 mg | ORAL_TABLET | Freq: Every day | ORAL | 0 refills | Status: DC

## 2017-12-01 MED ORDER — POLYETHYLENE GLYCOL 3350 17 G PO PACK: 17.0000 g | PACK | Freq: Every day | ORAL | 0 refills | Status: DC

## 2017-12-01 MED ORDER — ESCITALOPRAM OXALATE 10 MG PO TABS: 10.0000 mg | ORAL_TABLET | Freq: Every day | ORAL | 0 refills | Status: DC

## 2017-12-01 NOTE — Progress Notes (Signed)
[facility] for Infectious Disease  Date of Admission:  11/26/2017   Total days of antibiotics 2        Day 2 Ertapenam  ASSESSMENT: Rebecca Holsapple Shepherdis a 77 y.o.femalewith a recent admission for infectedrenalstoneswith resulting sepsis at [facility] and persistent LBP who found to have acute osteomyelitis discitis with associated phlegmon. She is now s/p disc aspiration with NGTD on 8/19. Blood cultures on 8/16 NGTD. Patient remains afebrile and hemodynamically stable. Patient had a PICC line placed today and will continue IV Ertapenam to cover ESBL-producing E.coli for a minimum of 6 additional weeks (total of 8 weeks when including the previous 2 week treatment of Ertapenam at SNF). I have placed the OPAT order. She will likely be discharged to a SNF for further IV antibiotic treatment.   PLAN: 1. Epidural Abscess, Osteomyelitis Discitis - IV Ertapenam for a minimum of 6 additional weeks. - OPAT order placed, instructions below.        Allergies  Allergen Reactions  . Penicillins Anaphylaxis and Hives    Has patient had a PCN reaction causing immediate rash, facial/tongue/throat swelling, SOB or lightheadedness with hypotension: Yes Has patient had a PCN reaction causing severe rash involving mucus membranes or skin necrosis: No Has patient had a PCN reaction that required hospitalization: unknown Has patient had a PCN reaction occurring within the last 10 years: No If all of the above answers are "NO", then may proceed with Cephalosporin use.  Santiago Bur [Nitrofurantoin Macrocrystal] Other (See Comments)    Unknown reaction  . Protonix [Pantoprazole Sodium] Other (See Comments)    Unknown reaction  . Ciprofloxacin Hcl Swelling and Rash    Throat swelling    OPAT Orders Discharge antibiotics: Ertapenam 1 g IV every 24 hours  Duration: 6 weeks  End Date: January 11, 2018  [facility] Care Per Protocol:  Labs weekly while on IV antibiotics: _X_ CBC with  differential __ BMP _X_ CMP __ CRP __ ESR __ Vancomycin trough  _X_ Please pull PIC at completion of IV antibiotics __ Please leave PIC in place until doctor has seen patient or been notified  Fax weekly labs to [phone]  Clinic Follow Up Appt: Follow-up with Dr. Megan Salon in 4 weeks   Principal Problem:   Epidural abscess Active Problems:   Osteomyelitis ([place])   Discitis of lumbosacral region   UTI due to extended-spectrum beta lactamase (ESBL) producing Escherichia coli   Multiple drug allergies   Bacteremia   Obesity   Scheduled Meds: . cholecalciferol  2,000 Units Oral Daily  . feeding supplement (GLUCERNA SHAKE)  120 mL Oral TID  . gabapentin  100 mg Oral TID  . oxyCODONE  10 mg Oral BID  . polyethylene glycol  17 g Oral Daily  . senna  1 tablet Oral Daily  . sodium chloride flush  10-40 mL Intracatheter Q12H  . sodium chloride flush  3 mL Intravenous Q12H   Continuous Infusions: . sodium chloride    . ertapenem 1,000 mg (12/01/17 1047)   PRN Meds:.sodium chloride, acetaminophen **OR** acetaminophen, antiseptic oral rinse, ketorolac, ondansetron **OR** ondansetron (ZOFRAN) IV, oxyCODONE, sodium chloride flush, sodium chloride flush   SUBJECTIVE: Patient reports no acute complaints. Patient reports continued low back pain when moving.  Review of Systems: Review of Systems  Constitutional: Negative for fever.  Respiratory: Negative for cough and shortness of breath.   Cardiovascular: Negative for chest pain.  Gastrointestinal: Negative for abdominal pain, nausea and  vomiting.  Genitourinary: Negative for dysuria, frequency and urgency.  Musculoskeletal: Positive for back pain (Lower back pain).  All other systems reviewed and are negative.   Allergies  Allergen Reactions  . Penicillins Anaphylaxis and Hives    Has patient had a PCN reaction causing immediate rash, facial/tongue/throat swelling, SOB or lightheadedness with hypotension: Yes Has  patient had a PCN reaction causing severe rash involving mucus membranes or skin necrosis: No Has patient had a PCN reaction that required hospitalization: unknown Has patient had a PCN reaction occurring within the last 10 years: No If all of the above answers are "NO", then may proceed with Cephalosporin use.  Santiago Bur [Nitrofurantoin Macrocrystal] Other (See Comments)    Unknown reaction  . Protonix [Pantoprazole Sodium] Other (See Comments)    Unknown reaction  . Ciprofloxacin Hcl Swelling and Rash    Throat swelling    OBJECTIVE: Vitals:   12/01/17 0010 12/01/17 0409 12/01/17 0906 12/01/17 1217  BP: (!) 122/58 111/63 135/64 130/63  Pulse: 68 72 63 70  Resp: 18 18 18 18   Temp: 98.4 F (36.9 C) 98.5 F (36.9 C) 98.9 F (37.2 C) 98.8 F (37.1 C)  TempSrc: Oral Oral Oral Oral  SpO2: 96% 94% 98% 94%  Weight:      Height:       Body mass index is 41.88 kg/m.  Physical Exam  Constitutional: She appearswell-developedand well-nourished. Very pleasant female lying in bed in no acute distress. HENT:  Head:Normocephalicand atraumatic.  Eyes:EOMare normal.  Cardiovascular:Normal rateand regular rhythm.  Pulmonary/Chest:Effort normaland breath sounds normal.  Abdominal:Soft.Non-tender to palpation. Bowel sounds are normal. Neurological: She isalert.  Skin: Skin iswarmand dry.  Psychiatric: She has anormal mood and affect. Herbehavior is normal.  Lab Results Lab Results  Component Value Date   WBC 9.5 11/27/2017   HGB 9.5 (L) 11/27/2017   HCT 29.4 (L) 11/27/2017   MCV 93.9 11/27/2017   PLT 259 11/27/2017    Lab Results  Component Value Date   CREATININE 0.77 11/27/2017   BUN 24 (H) 11/27/2017   NA 138 11/27/2017   K 4.1 11/27/2017   CL 108 11/27/2017   CO2 25 11/27/2017    Lab Results  Component Value Date   ALT 21 11/26/2017   AST 16 11/26/2017   ALKPHOS 145 (H) 11/26/2017   BILITOT 0.6 11/26/2017     Microbiology: Recent Results  (from the past 240 hour(s))  Culture, blood (routine x 2)     Status: None (Preliminary result)   Collection Time: 11/26/17  7:43 PM  Result Value Ref Range Status   Specimen Description BLOOD RIGHT ARM  Final   Special Requests   Final    BOTTLES DRAWN AEROBIC AND ANAEROBIC Blood Culture adequate volume   Culture   Final    NO GROWTH 4 DAYS Performed at [facility], [place] [address], [place], [place] [address]    Report Status PENDING  Incomplete  Culture, blood (routine x 2)     Status: None (Preliminary result)   Collection Time: 11/26/17  7:43 PM  Result Value Ref Range Status   Specimen Description BLOOD LEFT ARM  Final   Special Requests   Final    BOTTLES DRAWN AEROBIC AND ANAEROBIC Blood Culture adequate volume   Culture   Final    NO GROWTH 4 DAYS Performed at [facility], [address], [place],  [address]    Report Status PENDING  Incomplete  MRSA PCR Screening  Status: None   Collection Time: 11/27/17 12:05 AM  Result Value Ref Range Status   MRSA by PCR NEGATIVE NEGATIVE Final    Comment:        The GeneXpert MRSA Assay (FDA approved for NASAL specimens only), is one component of a comprehensive MRSA colonization surveillance program. It is not intended to diagnose MRSA infection nor to guide or monitor treatment for MRSA infections. Performed at [facility], [place] [address] [place], [place] [address]   Urine Culture     Status: Abnormal   Collection Time: 11/28/17 12:09 PM  Result Value Ref Range Status   Specimen Description URINE, CLEAN CATCH  Final   Special Requests   Final    NONE Performed at [facility], [place] [address] [place], [place] [address]    Culture MULTIPLE SPECIES PRESENT, SUGGEST RECOLLECTION (A)  Final   Report Status 11/29/2017 FINAL  Final  Aerobic/Anaerobic Culture (surgical/deep wound)     Status: None (Preliminary result)   Collection Time: 11/29/17  3:25 PM  Result Value Ref Range Status    Specimen Description WOUND  Final   Special Requests INTERVERTEBRAL DISC ASP  Final   Gram Stain   Final    MODERATE WBC PRESENT, PREDOMINANTLY PMN NO ORGANISMS SEEN    Culture   Final    NO GROWTH 2 DAYS NO ANAEROBES ISOLATED; CULTURE IN PROGRESS FOR 5 DAYS Performed at [facility], [place] [address] [place], [place] [address]    Report Status PENDING  [place], MD [phone] pager 12/01/2017, 1:01 PM

## 2017-12-01 NOTE — Progress Notes (Signed)
Peripherally Inserted Central Catheter/Midline Placement  The IV Nurse has discussed with the patient and/or persons authorized to consent for the patient, the purpose of this procedure and the potential benefits and risks involved with this procedure.  The benefits include less needle sticks, lab draws from the catheter, and the patient may be discharged home with the catheter. Risks include, but not limited to, infection, bleeding, blood clot (thrombus formation), and puncture of an artery; nerve damage and irregular heartbeat and possibility to perform a PICC exchange if needed/ordered by physician.  Alternatives to this procedure were also discussed.  B[employer] Power PICC patient education guide, fact sheet on infection prevention and patient information card has been provided to patient /or left at bedside.    PICC/Midline Placement Documentation        Rebecca Porter 12/01/2017, 9:47 AM

## 2017-12-01 NOTE — Care Management Note (Signed)
Case Management Note  Patient Details  Name: Rebecca Porter MRN: [id number] Date of Birth: 1941/03/23  Subjective/Objective:                    Action/Plan: Pt returning to [facility] today. CM signing off.   Expected Discharge Date:  12/01/17               Expected Discharge Plan:  Skilled Nursing Facility  In-House Referral:  Clinical Social Work  Discharge planning Services     Post Acute Care Choice:    Choice offered to:     DME Arranged:    DME Agency:     HH Arranged:    [employer] Agency:     Status of Service:  Completed, signed off  If discussed at [employer], dates discussed:    Additional Comments:  Pollie Friar, RN 12/01/2017, 12:16 PM

## 2017-12-01 NOTE — Discharge Summary (Signed)
Physician Discharge Summary  Rebecca Porter YWV:[id number] DOB: [birth year] DOA: 11/26/2017  PCP: Chesley Noon, MD  Admit date: 11/26/2017 Discharge date: 12/01/2017  Time spent: 35 minutes  Recommendations for Outpatient Follow-up:   Patient will need continued pain management and titration of gabapentin-I have increased her dose of short acting oxycodone this admission-would watch for somnolence if increasing gabapentin as well  She follows with Dr. 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 Maxie Better of [facility] 8/14-pain x-rays at the time suggested spondylolisthesis MRI eventually performed showed abscess and discitis L4/L5 exaggerated by movement  Admitted IR consulted-rpt MRI confirm phlegmon ID consulted recommendations That patient was recently admitted once again at [facility] and was found to have left UPJ stone and a stent was placed by Dr. Tamala Julian over the Sounds like eventual skilled placement   Hospital Course:  Epidural abscess-no antibiotics at  this time until at least cultures from aspiration scheudled for 8/19 ID Dr. Megan Salon input appreciated-currently on ertapenem-stop day as per protocol White count not elevated Pain uncontrolled-dates was taking 10 mg of OxyIR at facility will increase this on discharge 10 to 15 mg May need titration of gabapentin dosing as an outpatient at facility  Urinary incontinence-continue Ditropan XL 15 mg--only take sprn--d/c the med off MAR new changes  Constipation continue senna 1 tab daily use as needed monitor as increasing opiates  Bipolar continue Lexapro 10 daily, Remeron 15 at bedtime  CKD stage I-stable-periodic labs  Normocytic anemia  Hematuria? Dipstick no Bacteria--might need OP screening for the same  Mild constipation-at home on laxatives  Procedures:  PICC placed  Lumbar disc aspiration 8/19  Consultations:  ID  Discharge Exam: Vitals:   12/01/17 0409 12/01/17 0906  BP: 111/63 135/64  Pulse: 72 63  Resp: 18 18  Temp: 98.5 F (36.9 C) 98.9 F (37.2 C)  SpO2: 94% 98%    General: Alert still in pain Cardiovascular: S1-S2 no murmur rub or gallop Respiratory: Clinically clear no added sounds Abdomen soft nontender no rebound Lower extremity edema is present at baseline  Discharge Instructions   Discharge Instructions    Diet - low sodium heart healthy   Complete by:  As directed    Diet - low sodium heart healthy   Complete by:  As directed    Home infusion instructions [facility] May follow [place] Dosing Protocol; May administer Cathflo as needed to maintain patency of vascular access device.; Flushing of vascular access device: per [facility] Protocol: 0.9% NaCl pre/post medica...   Complete by:  As directed  Instructions:  May follow [place] Dosing Protocol   Instructions:  May administer Cathflo as needed to maintain patency of vascular access device.   Instructions:  Flushing of vascular access device: per [facility] Protocol: 0.9%  NaCl pre/post medication administration and prn patency; Heparin 100 u/ml, 19m for implanted ports and Heparin 10u/ml, 547mfor all other central venous catheters.   Instructions:  May follow [facility] Anaphylaxis Protocol for First Dose Administration in the home: 0.9% NaCl at 25-50 ml/hr to maintain IV access for protocol meds. Epinephrine 0.3 ml IV/IM PRN and Benadryl 25-50 IV/IM PRN s/s of anaphylaxis.   Instructions:  [facility] Coordinator (RN) to assist per patient IV care needs in the home PRN.   Increase activity slowly   Complete by:  As directed    Increase activity slowly   Complete by:  As directed      Allergies as of 12/01/2017      Reactions   Penicillins Anaphylaxis, Hives   Has patient had a PCN reaction causing immediate rash, facial/tongue/throat swelling, SOB or lightheadedness with hypotension: Yes Has patient had a PCN reaction causing severe rash involving mucus membranes or skin necrosis: No Has patient had a PCN reaction that required hospitalization: unknown Has patient had a PCN reaction occurring within the last 10 years: No If all of the above answers are "NO", then may proceed with Cephalosporin use.   Macrobid [nitrofurantoin Macrocrystal] Other (See Comments)   Unknown reaction   Protonix [pantoprazole Sodium] Other (See Comments)   Unknown reaction   Ciprofloxacin Hcl Swelling, Rash   Throat swelling      Medication List    STOP taking these medications   ertapenem 1 g in sodium chloride 0.9 % 100 mL   loratadine 10 MG tablet Commonly known as:  CLARITIN   Normal Saline Flush 0.9 % Soln   oxybutynin 15 MG 24 hr tablet Commonly known as:  DITROPAN XL   Vitamin D 2000 units tablet     TAKE these medications   antiseptic oral rinse Liqd 10 mLs by Mouth Rinse route 3 (three) times daily as needed for dry mouth. Swish and spit   Cranberry 450 MG Tabs Take 900 mg by mouth daily.   ertapenem  IVPB Commonly known as:  INVANZ Inject  1 g into the vein daily. Indication:  ESBL E. Coli Discitis/Osteomyelitis/Epidural Abscess Last Day of Therapy:  01/11/18 Labs - Once weekly:  CBC/D and BMP, Labs - Every other week:  ESR and CRP   escitalopram 10 MG tablet Commonly known as:  LEXAPRO Take 1 tablet (10 mg total) by mouth daily.   gabapentin 100 MG capsule Commonly known as:  NEURONTIN Take 100 mg by mouth 3 (three) times daily.   megestrol 400 MG/10ML suspension Commonly known as:  MEGACE Take 800 mg by mouth daily. 21 day course started 11/12/17   mirtazapine 15 MG tablet Commonly known as:  REMERON Take 1 tablet (15 mg total) by mouth at bedtime.   naproxen sodium 220 MG tablet Commonly known as:  ALEVE Take 220 mg by mouth at bedtime.   NUTRITIONAL SUPPLEMENT Liqd Take 60 mLs by mouth 3 (three) times daily. MedPass 2.0   Oxycodone HCl 10 MG Tabs Take 1 tablet (10 mg total) by mouth 2 (two) times daily. What changed:  Another medication with the same name was changed. Make sure you understand how and when to take each.   oxyCODONE 5 MG immediate release tablet Commonly known  as:  Oxy IR/ROXICODONE Take 2-3 tablets (10-15 mg total) by mouth every 6 (six) hours as needed for up to 7 days for severe pain or breakthrough pain. What changed:    how much to take  reasons to take this   polyethylene glycol packet Commonly known as:  MIRALAX / GLYCOLAX Take 17 g by mouth daily.   senna 8.6 MG Tabs tablet Commonly known as:  SENOKOT Take 1 tablet by mouth daily.            Home Infusion Instuctions  (From admission, onward)         Start     Ordered   12/01/17 0000  Home infusion instructions [facility] May follow [place] Dosing Protocol; May administer Cathflo as needed to maintain patency of vascular access device.; Flushing of vascular access device: per [facility] Protocol: 0.9% NaCl pre/post medica...    Question Answer Comment  Instructions May follow [place] Dosing Protocol    Instructions May administer Cathflo as needed to maintain patency of vascular access device.   Instructions Flushing of vascular access device: per [facility] Protocol: 0.9% NaCl pre/post medication administration and prn patency; Heparin 100 u/ml, 22m for implanted ports and Heparin 10u/ml, 560mfor all other central venous catheters.   Instructions May follow [facility] Anaphylaxis Protocol for First Dose Administration in the home: 0.9% NaCl at 25-50 ml/hr to maintain IV access for protocol meds. Epinephrine 0.3 ml IV/IM PRN and Benadryl 25-50 IV/IM PRN s/s of anaphylaxis.   Instructions [facility] Infusion Coordinator (RN) to assist per patient IV care needs in the home PRN.      12/01/17 1143         Allergies  Allergen Reactions  . Penicillins Anaphylaxis and Hives    Has patient had a PCN reaction causing immediate rash, facial/tongue/throat swelling, SOB or lightheadedness with hypotension: Yes Has patient had a PCN reaction causing severe rash involving mucus membranes or skin necrosis: No Has patient had a PCN reaction that required hospitalization: unknown Has patient had a PCN reaction occurring within the last 10 years: No If all of the above answers are "NO", then may proceed with Cephalosporin use.  . [place] BurNitrofurantoin Macrocrystal] Other (See Comments)    Unknown reaction  . Protonix [Pantoprazole Sodium] Other (See Comments)    Unknown reaction  . Ciprofloxacin Hcl Swelling and Rash    Throat swelling   Contact information for after-discharge care    Destination    [facility] .   Service:  Skilled Nursing Contact information: [id number]             The results of significant diagnostics from this hospitalization (including imaging, microbiology, ancillary and laboratory) are listed below for reference.    Significant Diagnostic Studies: Mr Lumbar Spine W Wo Contrast  Result Date:  11/28/2017 EXAM: MRI LUMBAR SPINE WITHOUT AND WITH CONTRAST MRI SACRUM WITHOUT AND WITH CONTRAST TECHNIQUE: Multiplanar and multiecho pulse sequences of the lumbar spine and sacrum were obtained without and with intravenous contrast. CONTRAST:  2055mULTIHANCE GADOBENATE DIMEGLUMINE 529 MG/ML IV SOLN COMPARISON:  None. FINDINGS: MRI LUMBAR SPINE FINDINGS: Segmentation: Normal segmentation. Lowest well-formed disc labeled the L5-S1 level. 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 Mild right convex scoliosis. Trace retrolisthesis of T12 on L1. Vertebrae: Vertebral body heights maintained without evidence for acute or chronic fracture. Bone marrow signal intensity diffusely heterogeneous  with extensive chronic reactive endplate changes seen at multiple levels. No worrisome osseous lesions. Abnormal edema with enhancement within the L5-S1 interspace, concerning for acute discitis. Marrow edema and enhancement within the adjacent L5 and S1 vertebral bodies compatible with associated osteomyelitis. Disc space height loss and mild endplate irregularity at this level. Mild associated paraspinous edema and phlegmon without discernible paraspinous abscess. Abnormal enhancing soft tissue within the ventral epidural space extending from L3-4 through S1 consistent with epidural phlegmon (series 20, image 8). Possible tiny 5 mm epidural abscess within the right ventral epidural space at the level of L4-5, best seen on sacral portion of this exam (series 14, image 9). Possible additional 9 mm epidural abscess at the left ventral epidural space/left lateral recess at the level of L5-S1 (series 14, image 12). These are more difficult to see on lumbar spine portion of this study. Phlegmon and enhancement extends into the bilateral foramina at L4-5 and L5-S1. Faint edema with enhancement within the posterior aspect of the L4-5 disc suspicious for possible early discitis at this level as  well (series 20, image 8). No other evidence for acute infection within the lumbar spine. No findings to suggest septic arthritis. Conus medullaris and cauda equina: Conus extends to the T12-L1 level. Conus and cauda equina appear normal. 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 Paraspinous soft tissues demonstrate no other acute abnormality. Scattered renal cysts noted bilaterally. Circumaortic left renal vein noted. Visualized soft tissues and visceral structures otherwise unremarkable. Disc levels: T11-12: Chronic degenerative intervertebral disc space narrowing with diffuse disc bulge, disc desiccation, and reactive endplate changes. Right-sided facet hypertrophy. No significant stenosis. T12-L1: Trace retrolisthesis. Diffuse disc bulge with disc desiccation and intervertebral disc space narrowing. Chronic reactive endplate changes. Mild facet hypertrophy. Resultant mild canal with moderate left lateral recess narrowing. Mild to moderate bilateral foraminal stenosis. L1-2: Chronic intervertebral disc space narrowing with diffuse disc bulge and disc desiccation. Reactive endplate changes. Moderate facet arthrosis. Moderate canal with left greater than right lateral recess narrowing. Mild left L1 foraminal stenosis. L2-3: Chronic intervertebral disc space narrowing with diffuse disc bulge and disc desiccation. Moderate facet hypertrophy. Mild spinal stenosis. Foramina remain patent. L3-4: Chronic intervertebral disc space narrowing with diffuse disc bulge. Moderate facet hypertrophy. Resultant mild spinal stenosis. Foramina remain patent. L4-5: Chronic intervertebral disc space narrowing with diffuse disc bulge. Possible early changes of acute discitis at the posterior L4-5 disc. Abnormal enhancement within the ventral epidural space concerning for epidural phlegmon. Possible small 5 mm abscess within the right ventral epidural space as above. Moderate facet  hypertrophy. Resultant mild canal with moderate bilateral lateral recess narrowing. Foramina remain patent. L5-S1: Chronic bilateral pars defects at L5 with 9 mm spondylolisthesis. Associated broad posterior pseudo disc bulge/uncovering. Changes related to osteomyelitis discitis. Epidural phlegmon and enhancement possible small 9 mm abscess within the left lateral recess as above. Moderate to advanced facet degeneration. Mild canal with bilateral lateral recess narrowing. Moderate bilateral L5 foraminal stenosis. MRI SACRUM FINDINGS: Visualized bone marrow signal intensity diffusely heterogeneous but within normal limits. No discrete or worrisome osseous lesions. No acute or chronic fracture. SI joints approximated and symmetric. No evidence for septic arthritis about either SI joint about the visualized hips or pubis symphysis. 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 Mild paraspinous edema and enhancement about the L5-S1 interspace extending inferiorly towards the presacral space. Associated fairly symmetric edema and enhancement within the piriformis musculature bilaterally. Asymmetric edema and enhancement  within the right gluteus medius musculature. Mild edema and enhancement within the posterior paraspinous musculature as well. No discrete abscess or drainable fluid collection. No free fluid within the pelvis. Bladder within normal limits. Uterus unremarkable. No made of a 12 mm simple left adnexal cyst. Large volume retained stool seen impacted within the rectal vault. Visualized bowels otherwise unremarkable. IMPRESSION: MR LUMBAR SPINE IMPRESSION: 1. 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 Associated epidural phlegmon extending from L3-4 through L5-S1 with a few possible tiny micro epidural abscesses as above. No drainable paraspinous collection identified. 2. Subtle changes of possible early discitis involving the posterior L4-5 interspace.  3. 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 4. Additional moderate multilevel degenerative spondylolysis as detailed above. Please see above report for a full description of these findings. MR SACRUM IMPRESSION: 1. 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 No other evidence for acute infection about the sacrum or within the visualized pelvis. 2. Large volume retained stool impacted within the rectal vault, suggesting constipation. 3. 12 mm simple left adnexal cyst, indeterminate. Further evaluation with nonemergent pelvic ultrasound recommended for further characterization. Electronically Signed   By: Jeannine Boga M.D.   On: 11/28/2017 03:30   Mr Sacrum Si Joints W Wo Contrast  Result Date: 11/28/2017 EXAM: MRI LUMBAR SPINE WITHOUT AND WITH CONTRAST MRI SACRUM WITHOUT AND WITH CONTRAST TECHNIQUE: Multiplanar and multiecho pulse sequences of the lumbar spine and sacrum were obtained without and with intravenous contrast. CONTRAST:  68m MULTIHANCE GADOBENATE DIMEGLUMINE 529 MG/ML IV SOLN COMPARISON:  None. FINDINGS: MRI LUMBAR SPINE FINDINGS: Segmentation: Normal segmentation. Lowest well-formed disc labeled the L5-S1 level. 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 Mild right convex scoliosis. Trace retrolisthesis of T12 on L1. Vertebrae: Vertebral body heights maintained without evidence for acute or chronic fracture. Bone marrow signal intensity diffusely heterogeneous with extensive chronic reactive endplate changes seen at multiple levels. No worrisome osseous lesions. Abnormal edema with enhancement within the L5-S1 interspace, concerning for acute discitis. Marrow edema and enhancement within the adjacent L5 and S1 vertebral bodies compatible with associated osteomyelitis. Disc space height  loss and mild endplate irregularity at this level. Mild associated paraspinous edema and phlegmon without discernible paraspinous abscess. Abnormal enhancing soft tissue within the ventral epidural space extending from L3-4 through S1 consistent with epidural phlegmon (series 20, image 8). Possible tiny 5 mm epidural abscess within the right ventral epidural space at the level of L4-5, best seen on sacral portion of this exam (series 14, image 9). Possible additional 9 mm epidural abscess at the left ventral epidural space/left lateral recess at the level of L5-S1 (series 14, image 12). These are more difficult to see on lumbar spine portion of this study. Phlegmon and enhancement extends into the bilateral foramina at L4-5 and L5-S1. Faint edema with enhancement within the posterior aspect of the L4-5 disc suspicious for possible early discitis at this level as well (series 20, image 8). No other evidence for acute infection within the lumbar spine. No findings to suggest septic arthritis. Conus medullaris and cauda equina: Conus extends to the T12-L1 level. Conus and cauda equina appear normal. 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 Paraspinous soft tissues demonstrate no other acute abnormality. Scattered renal cysts noted bilaterally. Circumaortic left renal vein noted. Visualized soft  tissues and visceral structures otherwise unremarkable. Disc levels: T11-12: Chronic degenerative intervertebral disc space narrowing with diffuse disc bulge, disc desiccation, and reactive endplate changes. Right-sided facet hypertrophy. No significant stenosis. T12-L1: Trace retrolisthesis. Diffuse disc bulge with disc desiccation and intervertebral disc space narrowing. Chronic reactive endplate changes. Mild facet hypertrophy. Resultant mild canal with moderate left lateral recess narrowing. Mild to moderate bilateral foraminal stenosis. L1-2: Chronic intervertebral disc  space narrowing with diffuse disc bulge and disc desiccation. Reactive endplate changes. Moderate facet arthrosis. Moderate canal with left greater than right lateral recess narrowing. Mild left L1 foraminal stenosis. L2-3: Chronic intervertebral disc space narrowing with diffuse disc bulge and disc desiccation. Moderate facet hypertrophy. Mild spinal stenosis. Foramina remain patent. L3-4: Chronic intervertebral disc space narrowing with diffuse disc bulge. Moderate facet hypertrophy. Resultant mild spinal stenosis. Foramina remain patent. L4-5: Chronic intervertebral disc space narrowing with diffuse disc bulge. Possible early changes of acute discitis at the posterior L4-5 disc. Abnormal enhancement within the ventral epidural space concerning for epidural phlegmon. Possible small 5 mm abscess within the right ventral epidural space as above. Moderate facet hypertrophy. Resultant mild canal with moderate bilateral lateral recess narrowing. Foramina remain patent. L5-S1: Chronic bilateral pars defects at L5 with 9 mm spondylolisthesis. Associated broad posterior pseudo disc bulge/uncovering. Changes related to osteomyelitis discitis. Epidural phlegmon and enhancement possible small 9 mm abscess within the left lateral recess as above. Moderate to advanced facet degeneration. Mild canal with bilateral lateral recess narrowing. Moderate bilateral L5 foraminal stenosis. MRI SACRUM FINDINGS: Visualized bone marrow signal intensity diffusely heterogeneous but within normal limits. No discrete or worrisome osseous lesions. No acute or chronic fracture. SI joints approximated and symmetric. No evidence for septic arthritis about either SI joint about the visualized hips or pubis symphysis. 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 Mild paraspinous edema and enhancement about the L5-S1 interspace extending inferiorly towards the presacral space. Associated fairly symmetric edema and enhancement within  the piriformis musculature bilaterally. Asymmetric edema and enhancement within the right gluteus medius musculature. Mild edema and enhancement within the posterior paraspinous musculature as well. No discrete abscess or drainable fluid collection. No free fluid within the pelvis. Bladder within normal limits. Uterus unremarkable. No made of a 12 mm simple left adnexal cyst. Large volume retained stool seen impacted within the rectal vault. Visualized bowels otherwise unremarkable. IMPRESSION: MR LUMBAR SPINE IMPRESSION: 1. 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 Associated epidural phlegmon extending from L3-4 through L5-S1 with a few possible tiny micro epidural abscesses as above. No drainable paraspinous collection identified. 2. Subtle changes of possible early discitis involving the posterior L4-5 interspace. 3. 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 4. Additional moderate multilevel degenerative spondylolysis as detailed above. Please see above report for a full description of these findings. MR SACRUM IMPRESSION: 1. 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 No other evidence for acute infection about the sacrum or within the visualized pelvis. 2. Large volume retained stool impacted within the rectal vault, suggesting constipation. 3. 12 mm simple left adnexal cyst, indeterminate. Further evaluation with nonemergent pelvic ultrasound recommended for further characterization. Electronically Signed   By: Jeannine Boga M.D.   On: 11/28/2017 03:30   Dg Chest Portable 1 View  Result Date: 11/26/2017 CLINICAL DATA:  PICC line placement EXAM: PORTABLE CHEST 1 VIEW COMPARISON:  03/13/2009 FINDINGS:  Right upper extremity catheter tip overlies the right brachiocephalic region. No acute opacity or  pleural effusion. Heart size within normal limits. No pneumothorax. Aortic atherosclerosis. IMPRESSION: Tip of the right upper extremity catheter projects over the brachiocephalic region. Electronically Signed   By: Donavan Foil M.D.   On: 11/26/2017 18:40   [place] Ekg Site Rite  Result Date: 11/30/2017 If Site Rite image not attached, placement could not be confirmed due to current cardiac rhythm.  Ir Lumbar Disc Aspiration W/img Guide  Result Date: 11/29/2017 INDICATION: L5-S1 discitis EXAM: DISC ASPIRATION MEDICATIONS: The patient is currently admitted to the hospital and receiving intravenous antibiotics. The antibiotics were administered within an appropriate time frame prior to the initiation of the procedure. ANESTHESIA/SEDATION: Fentanyl 50 mcg IV; Versed 1.5 mg IV Moderate Sedation Time:  16 minutes The patient was continuously monitored during the procedure by the interventional radiology nurse under my direct supervision. COMPLICATIONS: None immediate. PROCEDURE: Informed written consent was obtained from the patient after a thorough discussion of the procedural risks, benefits and alternatives. All questions were addressed. Maximal Sterile Barrier Technique was utilized including caps, mask, sterile gowns, sterile gloves, sterile drape, hand hygiene and skin antiseptic. A timeout was performed prior to the initiation of the procedure. The back was prepped and draped in a sterile fashion. 1% lidocaine was utilized for local anesthesia. Under fluoroscopic guidance, a 10 cm 18 gauge needle was advanced to the left lateral edge of the L5-S1 disc utilizing a posterolateral approach. A curved 22 gauge 20 cm Chiba needle was then advanced through the 18 gauge needle into the L5-S1 disc. AP and lateral imaging documents needle tip position in the central L5-S1 disc region. Aspiration yielded 1 cc cloudy serosanguineous fluid. FINDINGS: Imaging documents needle placement in the central location of the L5-S1  disc. IMPRESSION: Successful L5-S1 disc aspiration yielding 1 cc cloudy serosanguineous fluid. This was sent for culture. Electronically Signed   By: Marybelle Killings M.D.   On: 11/29/2017 15:52    Microbiology: Recent Results (from the past 240 hour(s))  Culture, blood (routine x 2)     Status: None (Preliminary result)   Collection Time: 11/26/17  7:43 PM  Result Value Ref Range Status   Specimen Description BLOOD RIGHT ARM  Final   Special Requests   Final    BOTTLES DRAWN AEROBIC AND ANAEROBIC Blood Culture adequate volume   Culture   Final    NO GROWTH 4 DAYS Performed at [facility], [address] [place], [place] [address]    Report Status PENDING  Incomplete  Culture, blood (routine x 2)     Status: None (Preliminary result)   Collection Time: 11/26/17  7:43 PM  Result Value Ref Range Status   Specimen Description BLOOD LEFT ARM  Final   Special Requests   Final    BOTTLES DRAWN AEROBIC AND ANAEROBIC Blood Culture adequate volume   Culture   Final    NO GROWTH 4 DAYS Performed at [facility], [address] [place], [place] [address]    Report Status PENDING  Incomplete  MRSA PCR Screening     Status: None   Collection Time: 11/27/17 12:05 AM  Result Value Ref Range Status   MRSA by PCR NEGATIVE NEGATIVE Final    Comment:        The GeneXpert MRSA Assay (FDA approved for NASAL specimens only), is one component of a comprehensive MRSA colonization surveillance program. It is not intended to diagnose  MRSA infection nor to guide or monitor treatment for MRSA infections. Performed at [facility], [place] [address], [place], [place] [address]   Urine Culture     Status: Abnormal   Collection Time: 11/28/17 12:09 PM  Result Value Ref Range Status   Specimen Description URINE, CLEAN CATCH  Final   Special Requests   Final    NONE Performed at [facility], [place] [address], [place], [place] [address]    Culture MULTIPLE SPECIES PRESENT, SUGGEST  RECOLLECTION (A)  Final   Report Status 11/29/2017 FINAL  Final  Aerobic/Anaerobic Culture (surgical/deep wound)     Status: None (Preliminary result)   Collection Time: 11/29/17  3:25 PM  Result Value Ref Range Status   Specimen Description WOUND  Final   Special Requests INTERVERTEBRAL DISC ASP  Final   Gram Stain   Final    MODERATE WBC PRESENT, PREDOMINANTLY PMN NO ORGANISMS SEEN    Culture   Final    NO GROWTH 2 DAYS NO ANAEROBES ISOLATED; CULTURE IN PROGRESS FOR 5 DAYS Performed at [facility], [place] [address] [place], [place] [address]    Report Status PENDING  Incomplete     Labs: Basic Metabolic Panel: Recent Labs  Lab 11/26/17 1942 11/27/17 0620  NA 139 138  K 4.3 4.1  CL 108 108  CO2 25 25  GLUCOSE 100* 91  BUN 27* 24*  CREATININE 0.77 0.77  CALCIUM 9.1 9.0   Liver Function Tests: Recent Labs  Lab 11/26/17 1942  AST 16  ALT 21  ALKPHOS 145*  BILITOT 0.6  PROT 6.0*  ALBUMIN 2.4*   No results for input(s): LIPASE, AMYLASE in the last 168 hours. No results for input(s): AMMONIA in the last 168 hours. CBC: Recent Labs  Lab 11/26/17 1942 11/27/17 0620  WBC 11.5* 9.5  NEUTROABS 8.1*  --   HGB 9.7* 9.5*  HCT 30.2* 29.4*  MCV 94.4 93.9  PLT 237 259   Cardiac Enzymes: No results for input(s): CKTOTAL, CKMB, CKMBINDEX, TROPONINI in the last 168 hours. BNP: BNP (last 3 results) No results for input(s): BNP in the last 8760 hours.  ProBNP (last 3 results) No results for input(s): PROBNP in the last 8760 hours.  CBG: No results for input(s): GLUCAP in the last 168 hours.     Signed:  Nita Sells MD   [facility] 12/01/2017, 11:50 AM

## 2017-12-01 NOTE — Progress Notes (Signed)
Discharge to: [place] Anticipated discharge date: 12/01/17 Transportation by: [facility]  Report #: [phone], Room 612B  [place] signing off.  Laveda Abbe LCSW [phone]

## 2017-12-02 DIAGNOSIS — A419 Sepsis, unspecified organism: Secondary | ICD-10-CM | POA: Diagnosis not present

## 2017-12-02 DIAGNOSIS — F339 Major depressive disorder, recurrent, unspecified: Secondary | ICD-10-CM | POA: Diagnosis not present

## 2017-12-02 DIAGNOSIS — M6281 Muscle weakness (generalized): Secondary | ICD-10-CM | POA: Diagnosis not present

## 2017-12-02 DIAGNOSIS — R682 Dry mouth, unspecified: Secondary | ICD-10-CM | POA: Diagnosis not present

## 2017-12-02 DIAGNOSIS — M4316 Spondylolisthesis, lumbar region: Secondary | ICD-10-CM | POA: Diagnosis not present

## 2017-12-02 DIAGNOSIS — K5900 Constipation, unspecified: Secondary | ICD-10-CM | POA: Diagnosis not present

## 2017-12-02 DIAGNOSIS — G061 Intraspinal abscess and granuloma: Secondary | ICD-10-CM | POA: Diagnosis not present

## 2017-12-02 DIAGNOSIS — N200 Calculus of kidney: Secondary | ICD-10-CM | POA: Diagnosis not present

## 2017-12-02 DIAGNOSIS — N390 Urinary tract infection, site not specified: Secondary | ICD-10-CM | POA: Diagnosis not present

## 2017-12-02 DIAGNOSIS — R52 Pain, unspecified: Secondary | ICD-10-CM | POA: Diagnosis not present

## 2017-12-02 DIAGNOSIS — N3281 Overactive bladder: Secondary | ICD-10-CM | POA: Diagnosis not present

## 2017-12-04 LAB — AEROBIC/ANAEROBIC CULTURE W GRAM STAIN (SURGICAL/DEEP WOUND)

## 2017-12-04 LAB — AEROBIC/ANAEROBIC CULTURE (SURGICAL/DEEP WOUND): CULTURE: NO GROWTH

## 2017-12-06 DIAGNOSIS — A419 Sepsis, unspecified organism: Secondary | ICD-10-CM | POA: Diagnosis not present

## 2017-12-06 DIAGNOSIS — R682 Dry mouth, unspecified: Secondary | ICD-10-CM | POA: Diagnosis not present

## 2017-12-06 DIAGNOSIS — M6281 Muscle weakness (generalized): Secondary | ICD-10-CM | POA: Diagnosis not present

## 2017-12-06 DIAGNOSIS — K5900 Constipation, unspecified: Secondary | ICD-10-CM | POA: Diagnosis not present

## 2017-12-06 DIAGNOSIS — R52 Pain, unspecified: Secondary | ICD-10-CM | POA: Diagnosis not present

## 2017-12-06 DIAGNOSIS — N3281 Overactive bladder: Secondary | ICD-10-CM | POA: Diagnosis not present

## 2017-12-06 DIAGNOSIS — N390 Urinary tract infection, site not specified: Secondary | ICD-10-CM | POA: Diagnosis not present

## 2017-12-06 DIAGNOSIS — N200 Calculus of kidney: Secondary | ICD-10-CM | POA: Diagnosis not present

## 2017-12-06 DIAGNOSIS — F339 Major depressive disorder, recurrent, unspecified: Secondary | ICD-10-CM | POA: Diagnosis not present

## 2017-12-06 DIAGNOSIS — M4316 Spondylolisthesis, lumbar region: Secondary | ICD-10-CM | POA: Diagnosis not present

## 2017-12-06 DIAGNOSIS — G061 Intraspinal abscess and granuloma: Secondary | ICD-10-CM | POA: Diagnosis not present

## 2017-12-07 DIAGNOSIS — N201 Calculus of ureter: Secondary | ICD-10-CM | POA: Diagnosis not present

## 2017-12-08 DIAGNOSIS — M6281 Muscle weakness (generalized): Secondary | ICD-10-CM | POA: Diagnosis not present

## 2017-12-08 DIAGNOSIS — N390 Urinary tract infection, site not specified: Secondary | ICD-10-CM | POA: Diagnosis not present

## 2017-12-08 DIAGNOSIS — K5900 Constipation, unspecified: Secondary | ICD-10-CM | POA: Diagnosis not present

## 2017-12-08 DIAGNOSIS — R682 Dry mouth, unspecified: Secondary | ICD-10-CM | POA: Diagnosis not present

## 2017-12-08 DIAGNOSIS — R52 Pain, unspecified: Secondary | ICD-10-CM | POA: Diagnosis not present

## 2017-12-08 DIAGNOSIS — A419 Sepsis, unspecified organism: Secondary | ICD-10-CM | POA: Diagnosis not present

## 2017-12-08 DIAGNOSIS — F339 Major depressive disorder, recurrent, unspecified: Secondary | ICD-10-CM | POA: Diagnosis not present

## 2017-12-08 DIAGNOSIS — M4316 Spondylolisthesis, lumbar region: Secondary | ICD-10-CM | POA: Diagnosis not present

## 2017-12-08 DIAGNOSIS — G061 Intraspinal abscess and granuloma: Secondary | ICD-10-CM | POA: Diagnosis not present

## 2017-12-08 DIAGNOSIS — N3281 Overactive bladder: Secondary | ICD-10-CM | POA: Diagnosis not present

## 2017-12-13 DIAGNOSIS — R682 Dry mouth, unspecified: Secondary | ICD-10-CM | POA: Diagnosis not present

## 2017-12-13 DIAGNOSIS — A419 Sepsis, unspecified organism: Secondary | ICD-10-CM | POA: Diagnosis not present

## 2017-12-13 DIAGNOSIS — N200 Calculus of kidney: Secondary | ICD-10-CM | POA: Diagnosis not present

## 2017-12-13 DIAGNOSIS — M4316 Spondylolisthesis, lumbar region: Secondary | ICD-10-CM | POA: Diagnosis not present

## 2017-12-13 DIAGNOSIS — F339 Major depressive disorder, recurrent, unspecified: Secondary | ICD-10-CM | POA: Diagnosis not present

## 2017-12-13 DIAGNOSIS — W19XXXD Unspecified fall, subsequent encounter: Secondary | ICD-10-CM | POA: Diagnosis not present

## 2017-12-13 DIAGNOSIS — G061 Intraspinal abscess and granuloma: Secondary | ICD-10-CM | POA: Diagnosis not present

## 2017-12-13 DIAGNOSIS — K5900 Constipation, unspecified: Secondary | ICD-10-CM | POA: Diagnosis not present

## 2017-12-13 DIAGNOSIS — R52 Pain, unspecified: Secondary | ICD-10-CM | POA: Diagnosis not present

## 2017-12-13 DIAGNOSIS — N390 Urinary tract infection, site not specified: Secondary | ICD-10-CM | POA: Diagnosis not present

## 2017-12-13 DIAGNOSIS — N3281 Overactive bladder: Secondary | ICD-10-CM | POA: Diagnosis not present

## 2017-12-13 DIAGNOSIS — M6281 Muscle weakness (generalized): Secondary | ICD-10-CM | POA: Diagnosis not present

## 2017-12-15 ENCOUNTER — Other Ambulatory Visit: Payer: Self-pay | Admitting: Urology

## 2017-12-15 DIAGNOSIS — F4323 Adjustment disorder with mixed anxiety and depressed mood: Secondary | ICD-10-CM | POA: Diagnosis not present

## 2017-12-15 DIAGNOSIS — F320 Major depressive disorder, single episode, mild: Secondary | ICD-10-CM | POA: Diagnosis not present

## 2017-12-20 DIAGNOSIS — N200 Calculus of kidney: Secondary | ICD-10-CM | POA: Diagnosis not present

## 2017-12-20 DIAGNOSIS — K5900 Constipation, unspecified: Secondary | ICD-10-CM | POA: Diagnosis not present

## 2017-12-20 DIAGNOSIS — R52 Pain, unspecified: Secondary | ICD-10-CM | POA: Diagnosis not present

## 2017-12-20 DIAGNOSIS — N3281 Overactive bladder: Secondary | ICD-10-CM | POA: Diagnosis not present

## 2017-12-20 DIAGNOSIS — W19XXXD Unspecified fall, subsequent encounter: Secondary | ICD-10-CM | POA: Diagnosis not present

## 2017-12-20 DIAGNOSIS — F339 Major depressive disorder, recurrent, unspecified: Secondary | ICD-10-CM | POA: Diagnosis not present

## 2017-12-20 DIAGNOSIS — M4316 Spondylolisthesis, lumbar region: Secondary | ICD-10-CM | POA: Diagnosis not present

## 2017-12-20 DIAGNOSIS — N390 Urinary tract infection, site not specified: Secondary | ICD-10-CM | POA: Diagnosis not present

## 2017-12-20 DIAGNOSIS — R682 Dry mouth, unspecified: Secondary | ICD-10-CM | POA: Diagnosis not present

## 2017-12-20 DIAGNOSIS — M6281 Muscle weakness (generalized): Secondary | ICD-10-CM | POA: Diagnosis not present

## 2017-12-20 DIAGNOSIS — G061 Intraspinal abscess and granuloma: Secondary | ICD-10-CM | POA: Diagnosis not present

## 2017-12-20 DIAGNOSIS — A419 Sepsis, unspecified organism: Secondary | ICD-10-CM | POA: Diagnosis not present

## 2017-12-21 NOTE — Progress Notes (Signed)
Spoke with Coni at [facility] to assure Dr. Karsten Ro is aware tht pt. Has a picc line and is on IV invanz until 01-11-18

## 2017-12-21 NOTE — Patient Instructions (Addendum)
Rebecca Porter  12/21/2017   Your procedure is scheduled on: 12-31-17  Report to [facility] Main  Entrance  Report to admitting at      1000 AM    Call this number if you have problems the morning of surgery [phone]   Remember: Do not eat food or drink liquids :After Midnight.     Take these medicines the morning of surgery with A SIP OF WATER: gabapentin, oxycodone if needed, IV INVANZ if due                                 You may not have any metal on your body including hair pins and              piercings  Do not wear jewelry, make-up, lotions, powders or perfumes, deodorant             Do not wear nail polish.  Do not shave  48 hours prior to surgery.           Do not bring valuables to the hospital. [place].  Contacts, dentures or bridgework may not be worn into surgery.      Patients discharged the day of surgery will not be allowed to drive home.  Name and phone number of your driver:  Special Instructions: N/A              Please read over the following fact sheets you were given: _____________________________________________________________________           [facility] - Preparing for Surgery Before surgery, you can play an important role.  Because skin is not sterile, your skin needs to be as free of germs as possible.  You can reduce the number of germs on your skin by washing with CHG (chlorahexidine gluconate) soap before surgery.  CHG is an antiseptic cleaner which kills germs and bonds with the skin to continue killing germs even after washing. Please DO NOT use if you have an allergy to CHG or antibacterial soaps.  If your skin becomes reddened/irritated stop using the CHG and inform your nurse when you arrive at Short Stay. Do not shave (including legs and underarms) for at least 48 hours prior to the first CHG shower.  You may shave your face/neck. Please follow these  instructions carefully:  1.  Shower with CHG Soap the night before surgery and the  morning of Surgery.  2.  If you choose to wash your hair, wash your hair first as usual with your  normal  shampoo.  3.  After you shampoo, rinse your hair and body thoroughly to remove the  shampoo.                           4.  Use CHG as you would any other liquid soap.  You can apply chg directly  to the skin and wash                       Gently with a scrungie or clean washcloth.  5.  Apply the CHG Soap to your body ONLY FROM THE NECK DOWN.   Do not use on  face/ open                           Wound or open sores. Avoid contact with eyes, ears mouth and genitals (private parts).                       Wash face,  Genitals (private parts) with your normal soap.             6.  Wash thoroughly, paying special attention to the area where your surgery  will be performed.  7.  Thoroughly rinse your body with warm water from the neck down.  8.  DO NOT shower/wash with your normal soap after using and rinsing off  the CHG Soap.                9.  Pat yourself dry with a clean towel.            10.  Wear clean pajamas.            11.  Place clean sheets on your bed the night of your first shower and do not  sleep with pets. Day of Surgery : Do not apply any lotions/deodorants the morning of surgery.  Please wear clean clothes to the hospital/surgery center.  FAILURE TO FOLLOW THESE INSTRUCTIONS MAY RESULT IN THE CANCELLATION OF YOUR SURGERY PATIENT SIGNATURE_________________________________  NURSE SIGNATURE__________________________________  ________________________________________________________________________

## 2017-12-22 ENCOUNTER — Other Ambulatory Visit: Payer: Self-pay

## 2017-12-22 ENCOUNTER — Encounter (HOSPITAL_COMMUNITY)
Admission: RE | Admit: 2017-12-22 | Discharge: 2017-12-22 | Disposition: A | Discharge status: Home / Self Care | Payer: No Typology Code available for payment source | Source: Ambulatory Visit | Attending: Urology | Admitting: Urology

## 2017-12-22 ENCOUNTER — Encounter (HOSPITAL_COMMUNITY): Payer: Self-pay

## 2017-12-22 DIAGNOSIS — Z01812 Encounter for preprocedural laboratory examination: Secondary | ICD-10-CM | POA: Insufficient documentation

## 2017-12-22 DIAGNOSIS — Z01810 Encounter for preprocedural cardiovascular examination: Secondary | ICD-10-CM | POA: Insufficient documentation

## 2017-12-22 HISTORY — DX: Sepsis, unspecified organism: A41.9

## 2017-12-22 HISTORY — DX: Malignant (primary) neoplasm, unspecified: C80.1

## 2017-12-22 LAB — BASIC METABOLIC PANEL
Anion gap: 9 (ref 5–15)
BUN: 22 mg/dL (ref 8–23)
CALCIUM: 9.6 mg/dL (ref 8.9–10.3)
CHLORIDE: 108 mmol/L (ref 98–111)
CO2: 26 mmol/L (ref 22–32)
Creatinine, Ser: 0.85 mg/dL (ref 0.44–1.00)
Glucose, Bld: 89 mg/dL (ref 70–99)
Potassium: 3.7 mmol/L (ref 3.5–5.1)
SODIUM: 143 mmol/L (ref 135–145)

## 2017-12-22 LAB — CBC
HCT: 34.3 % — ABNORMAL LOW (ref 36.0–46.0)
Hemoglobin: 11.1 g/dL — ABNORMAL LOW (ref 12.0–15.0)
MCH: 30.2 pg (ref 26.0–34.0)
MCHC: 32.4 g/dL (ref 30.0–36.0)
MCV: 93.5 fL (ref 78.0–100.0)
Platelets: 222 10*3/uL (ref 150–400)
RBC: 3.67 MIL/uL — AB (ref 3.87–5.11)
RDW: 15.6 % — AB (ref 11.5–15.5)
WBC: 6.7 10*3/uL (ref 4.0–10.5)

## 2017-12-22 NOTE — Progress Notes (Signed)
Chart left with Sahron for clearance follow up

## 2017-12-22 NOTE — Progress Notes (Signed)
Spoke with Dr. Nyoka Cowden anesthesia pt. Needs either clearance from her primary care Dr. [facility] stone or a cardiac clearance prior to surgery due to a new Left Bundle Branch block. Left message on Coni Mabe's phone Dr. Simone Curia scheduler.

## 2017-12-22 NOTE — Progress Notes (Signed)
Final EKG done 12-22-17 in epic

## 2017-12-24 NOTE — Progress Notes (Signed)
Spoke with Rebecca Porter patient has appointment dr Claiborne Billings 12-28-17 for cardiac clearance.

## 2017-12-24 NOTE — Progress Notes (Signed)
Left [facility] at [facility] needs medical or cardiac clearance. Please fax to [phone].

## 2017-12-27 DIAGNOSIS — K5900 Constipation, unspecified: Secondary | ICD-10-CM | POA: Diagnosis not present

## 2017-12-27 DIAGNOSIS — R52 Pain, unspecified: Secondary | ICD-10-CM | POA: Diagnosis not present

## 2017-12-27 DIAGNOSIS — G061 Intraspinal abscess and granuloma: Secondary | ICD-10-CM | POA: Diagnosis not present

## 2017-12-27 DIAGNOSIS — M4316 Spondylolisthesis, lumbar region: Secondary | ICD-10-CM | POA: Diagnosis not present

## 2017-12-27 DIAGNOSIS — R682 Dry mouth, unspecified: Secondary | ICD-10-CM | POA: Diagnosis not present

## 2017-12-27 DIAGNOSIS — M6281 Muscle weakness (generalized): Secondary | ICD-10-CM | POA: Diagnosis not present

## 2017-12-27 DIAGNOSIS — N200 Calculus of kidney: Secondary | ICD-10-CM | POA: Diagnosis not present

## 2017-12-27 DIAGNOSIS — N3281 Overactive bladder: Secondary | ICD-10-CM | POA: Diagnosis not present

## 2017-12-27 DIAGNOSIS — A419 Sepsis, unspecified organism: Secondary | ICD-10-CM | POA: Diagnosis not present

## 2017-12-27 DIAGNOSIS — F339 Major depressive disorder, recurrent, unspecified: Secondary | ICD-10-CM | POA: Diagnosis not present

## 2017-12-27 DIAGNOSIS — N390 Urinary tract infection, site not specified: Secondary | ICD-10-CM | POA: Diagnosis not present

## 2017-12-27 DIAGNOSIS — W19XXXD Unspecified fall, subsequent encounter: Secondary | ICD-10-CM | POA: Diagnosis not present

## 2017-12-28 ENCOUNTER — Encounter: Payer: Self-pay | Admitting: Cardiovascular Disease

## 2017-12-28 ENCOUNTER — Ambulatory Visit (INDEPENDENT_AMBULATORY_CARE_PROVIDER_SITE_OTHER): Payer: Medicare Other | Admitting: Cardiovascular Disease

## 2017-12-28 VITALS — BP 130/80 | HR 88 | Ht 66.0 in | Wt 223.0 lb

## 2017-12-28 DIAGNOSIS — Z01810 Encounter for preprocedural cardiovascular examination: Secondary | ICD-10-CM | POA: Diagnosis not present

## 2017-12-28 DIAGNOSIS — R600 Localized edema: Secondary | ICD-10-CM

## 2017-12-28 DIAGNOSIS — I447 Left bundle-branch block, unspecified: Secondary | ICD-10-CM

## 2017-12-28 DIAGNOSIS — Z79899 Other long term (current) drug therapy: Secondary | ICD-10-CM

## 2017-12-28 DIAGNOSIS — M4647 Discitis, unspecified, lumbosacral region: Secondary | ICD-10-CM | POA: Diagnosis not present

## 2017-12-28 DIAGNOSIS — Z13220 Encounter for screening for lipoid disorders: Secondary | ICD-10-CM | POA: Diagnosis not present

## 2017-12-28 DIAGNOSIS — N200 Calculus of kidney: Secondary | ICD-10-CM

## 2017-12-28 DIAGNOSIS — R0609 Other forms of dyspnea: Secondary | ICD-10-CM | POA: Diagnosis not present

## 2017-12-28 NOTE — Progress Notes (Signed)
Cardiology Office Note    Date:  01/01/2018   ID:  Rebecca Porter, DOB 1940/08/06, MRN [id number]  PCP:  Chesley Noon, MD  Cardiologist:  Shelva Majestic, MD   New cardiololgy evaluation per request of Dr. Karsten Ro  Chief Complaint  Patient presents with  . Edema    both ankles swole  . Shortness of Breath    when active    History of Present Illness:  Rebecca Porter is a 77 y.o. female who presents to the office today for cardiology clearance prior to undergoing urologic surgery.  Rebecca Porter has a history of a left kidney stone leading to hospitalization in July 2019 with urosepsis secondary to E. coli sensitive to ertapenem.  She subsequently developed low back pain and was felt to have L4-L5 epidural abscess and discitis leading to her hospitalization at [facility] in August 2019.  She was evaluated by neurology and had a PICC line inserted for 6 weeks of antibiotic therapy.  She has noticed mild shortness of breath and progressive exertional dyspnea but denies any definitive episodes of chest pain or palpitations.  She is in need to undergo urologic surgery and audiology clearance was recommended prior to surgery due to new left bundle branch block.  Of note, the patient states that she was evaluated July and ECG at that time was done.  I was able to obtain the ECG report but not see the tracing and reportedly this was interpreted as sinus tachycardia with anteroseptal infarct, age undetermined, ST abnormality with possible lateral subendocardial injury and was felt to be abnormal without prior ECG available for comparison.  She denies any episodes of chest pressure.  She denies ever suffering a prior heart attack.  In addition to her mild progressive shortness of breath with exertional dyspnea she has noticed leg swelling.  She presents for evaluation.  Past Medical History:  Diagnosis Date  . Cancer [facility])    breast cancer 2002   . Personal history of radiation therapy 2002  .  Sepsis ([facility])    due to kidney stone blocking the flow per pt. family  10-2017  IV [facility]    Past Surgical History:  Procedure Laterality Date  . BREAST BIOPSY Left    malignant stereo   . BREAST LUMPECTOMY Left 2002  . CHOLECYSTECTOMY     1997  . CYSTOSCOPY     Left stone removal 12-31-17  . IR LUMBAR DISC ASPIRATION W/IMG GUIDE  11/29/2017   abscess on spine  between L5 and S1   On IV Invanz   . RENAL ARTERY STENT     left November 07 2017    Current Medications: Outpatient Medications Prior to Visit  Medication Sig Dispense Refill  . ertapenem (INVANZ) IVPB Inject 1 g into the vein daily. Indication:  ESBL E. Coli Discitis/Osteomyelitis/Epidural Abscess Last Day of Therapy:  01/11/18 Labs - Once weekly:  CBC/D and BMP, Labs - Every other week:  ESR and CRP 42 Units 0  . gabapentin (NEURONTIN) 100 MG capsule Take 100 mg by mouth 3 (three) times daily.    Marland Kitchen NUTRITIONAL SUPPLEMENT LIQD Take 60 mLs by mouth 3 (three) times daily. MedPass 2.0    . oxyCODONE (OXY IR/ROXICODONE) 5 MG immediate release tablet Take 5 mg by mouth 2 (two) times daily.     . polyethylene glycol (MIRALAX / GLYCOLAX) packet Take 17 g by mouth daily. 14 each 0  . senna (SENOKOT) 8.6 MG TABS tablet Take 1 tablet  by mouth daily.    Marland Kitchen antiseptic oral rinse (BIOTENE) LIQD 10 mLs by Mouth Rinse route 3 (three) times daily as needed for dry mouth. Swish and spit    . Cranberry 450 MG TABS Take 450 mg by mouth daily.     Marland Kitchen escitalopram (LEXAPRO) 10 MG tablet Take 1 tablet (10 mg total) by mouth daily. (Patient not taking: Reported on 12/17/2017) 4 tablet 0  . mirtazapine (REMERON) 15 MG tablet Take 1 tablet (15 mg total) by mouth at bedtime. (Patient not taking: Reported on 12/28/2017) 4 tablet 0  . naproxen sodium (ALEVE) 220 MG tablet Take 220 mg by mouth at bedtime.    Marland Kitchen oxyCODONE (OXY IR/ROXICODONE) 5 MG immediate release tablet Take 15 mg by mouth every 6 (six) hours as needed for severe pain.     No  facility-administered medications prior to visit.      Allergies:   Ciprofloxacin hcl; Penicillins; Macrobid [nitrofurantoin macrocrystal]; and Protonix [pantoprazole sodium]   Social History   Socioeconomic History  . Marital status: Widowed    Spouse name: Not on file  . Number of children: Not on file  . Years of education: Not on file  . Highest education level: Not on file  Occupational History  . Not on file  Social Needs  . Financial resource strain: Not on file  . Food insecurity:    Worry: Not on file    Inability: Not on file  . Transportation needs:    Medical: Not on file    Non-medical: Not on file  Tobacco Use  . Smoking status: Never Smoker  . Smokeless tobacco: Never Used  Substance and Sexual Activity  . Alcohol use: Never    Frequency: Never  . Drug use: Never  . Sexual activity: Not Currently  Lifestyle  . Physical activity:    Days per week: Not on file    Minutes per session: Not on file  . Stress: Not on file  Relationships  . Social connections:    Talks on phone: Not on file    Gets together: Not on file    Attends religious service: Not on file    Active member of club or organization: Not on file    Attends meetings of clubs or organizations: Not on file    Relationship status: Not on file  Other Topics Concern  . Not on file  Social History Narrative  . Not on file    Social history is notable that she is widowed for 10 years.  She has 2 children, 4 grandchildren, and 2 great-grandchildren.  She is self-employed and is in [employer].  There is no tobacco history.  She does not exercise.  There is no alcohol use.  Family History:  The patient's family history is not on file.   Family history is notable for mother dying at age 55 with colon cancer.  Her father died at age 73 but has suffered a massive heart attack in 37.    ROS General: Negative; No fevers, chills, or night sweats;  HEENT: Negative; No changes in vision or  hearing, sinus congestion, difficulty swallowing Pulmonary: Negative; No cough, wheezing, shortness of breath, hemoptysis Cardiovascular: Positive for exertional dyspnea.  No chest pain or palpitations; no awareness of prior MI; positive for leg swelling GI: Negative; No nausea, vomiting, diarrhea, or abdominal pain GU: Positive for recent urosepsis and left kidney stone with subsequent stenting Musculoskeletal: Right knee discomfort which has limited her activity.  Hematologic/Oncology: Negative; no easy bruising, bleeding Endocrine: Negative; no heat/cold intolerance; no diabetes Neuro: Sciatica symptoms from the spine abscess Skin: Negative; No rashes or skin lesions Psychiatric: Negative; No behavioral problems, depression Sleep: Negative; No snoring, daytime sleepiness, hypersomnolence, bruxism, restless legs, hypnogognic hallucinations, no cataplexy Other comprehensive 14 point system review is negative.   PHYSICAL EXAM:   VS:  BP 130/80   Pulse 88   Ht 5' 6"  (1.676 m)   Wt 223 lb (101.2 kg)   BMI 35.99 kg/m     Repeat blood pressure by me was 132/80  Wt Readings from Last 3 Encounters:  12/30/17 223 lb (101.2 kg)  12/28/17 223 lb (101.2 kg)  12/22/17 222 lb (100.7 kg)    General: Alert, oriented, no distress.  Skin: normal turgor, no rashes, warm and dry HEENT: Normocephalic, atraumatic. Pupils equal round and reactive to light; sclera anicteric; extraocular muscles intact; positive for arcus senilis bilaterally; fundi without hemorrhages or exudates Nose without nasal septal hypertrophy Mouth/Parynx benign; Mallinpatti scale 3 Neck: No JVD, no carotid bruits; normal carotid upstroke Lungs: clear to ausculatation and percussion; no wheezing or rales Chest wall: without tenderness to palpitation Heart: PMI not displaced, RRR, s1 s2 normal, 1/6 systolic murmur, no diastolic murmur, no rubs, gallops, thrills, or heaves Abdomen: soft, nontender; no hepatosplenomehaly, BS+;  abdominal aorta nontender and not dilated by palpation. Back: no CVA tenderness Pulses 2+ Musculoskeletal: full range of motion, normal strength, no joint deformities Extremities: 2+ left lower extremity and 1+ right lower extremity; no clubbing cyanosis, Homan's sign negative  Neurologic: grossly nonfocal; Cranial nerves grossly wnl Psychologic: Normal mood and affect   Studies/Labs Reviewed:   EKG:  EKG is ordered today.  ECG (independently read by me): Normal sinus rhythm at 88 bpm.  Left bundle branch block with repolarization changes.  Poor R wave progression V1 through V6.  December 22, 2017 ECG (independently reviewed by me): Normal sinus rhythm.  Left bundle branch block with repolarization changes.   Recent Labs: BMP Latest Ref Rng & Units 12/22/2017 11/27/2017 11/26/2017  Glucose 70 - 99 mg/dL 89 91 100(H)  BUN 8 - 23 mg/dL 22 24(H) 27(H)  Creatinine 0.44 - 1.00 mg/dL 0.85 0.77 0.77  Sodium 135 - 145 mmol/L 143 138 139  Potassium 3.5 - 5.1 mmol/L 3.7 4.1 4.3  Chloride 98 - 111 mmol/L 108 108 108  CO2 22 - 32 mmol/L 26 25 25   Calcium 8.9 - 10.3 mg/dL 9.6 9.0 9.1     Hepatic Function Latest Ref Rng & Units 12/30/2017 11/26/2017  Total Protein 6.0 - 8.5 g/dL 6.4 6.0(L)  Albumin 3.5 - 4.8 g/dL 3.5 2.4(L)  AST 0 - 40 IU/L 20 16  ALT 0 - 32 IU/L 15 21  Alk Phosphatase 39 - 117 IU/L 98 145(H)  Total Bilirubin 0.0 - 1.2 mg/dL 0.4 0.6  Bilirubin, Direct 0.00 - 0.40 mg/dL 0.13 -    CBC Latest Ref Rng & Units 12/22/2017 11/27/2017 11/26/2017  WBC 4.0 - 10.5 K/uL 6.7 9.5 11.5(H)  Hemoglobin 12.0 - 15.0 g/dL 11.1(L) 9.5(L) 9.7(L)  Hematocrit 36.0 - 46.0 % 34.3(L) 29.4(L) 30.2(L)  Platelets 150 - 400 K/uL 222 259 237   Lab Results  Component Value Date   MCV 93.5 12/22/2017   MCV 93.9 11/27/2017   MCV 94.4 11/26/2017   Lab Results  Component Value Date   TSH 2.470 12/30/2017   No results found for: HGBA1C   BNP No results found for: BNP  ProBNP  No results found for:  PROBNP   Lipid Panel     Component Value Date/Time   CHOL 186 12/30/2017 1022   TRIG 71 12/30/2017 1022   HDL 65 12/30/2017 1022   CHOLHDL 2.9 12/30/2017 1022   LDLCALC 107 (H) 12/30/2017 1022     RADIOLOGY: No results found.   Additional studies/ records that were reviewed today include:  Reviewed the patient's hospital records and obtained interpretation analysis of a prior ECG.  Recent laboratory was reviewed.  ASSESSMENT:    1. Pre-operative cardiovascular examination   2. New onset left bundle branch block (LBBB)   3. Lipid screening   4. DOE (dyspnea on exertion)   5. Medication management   6. Kidney stone on left side   7. Discitis of lumbosacral region      PLAN:  Rebecca Porter is a 77 year old female who denies any awareness of prior cardiac history.  She had developed recent urosepsis from a left kidney stone which was culture positive for extended spectrum beta-lactamase (ESBL) E. coli requiring PICC line insertion and 6 weeks of antibiotic therapy.  She has multiple allergies and required ertapenem antibiotic therapy.  Her ECG reported in July from [place] raise the possibility of age-indeterminate anteroseptal MI with possible lateral subendocardial injury.  Her ECG today shows sinus rhythm with poor R wave progression and left bundle branch block with repolarization changes.  She has experienced exertional dyspnea and increased shortness of breath as well as leg edema.  I have recommended she undergo preoperative echo Doppler assessment to evaluate LV systolic and diastolic function as well as valvular architecture.  With her ECG abnormalities, I have recommended she undergo a Lexiscan Myoview study to further evaluate myocardial perfusion, scar/ischemia.  I also have recommended fasting lipids, TSH and LFTs and have reviewed her most recent CBC where she has mild anemia with a hemoglobin of 11.1 and had a normal chemistry profile.  He was tentatively scheduled to  undergo removal of her urologic stent this week but I have recommended preoperative assessment with the echo and nuclear perfusion study.  Contact her regarding the results of the studies depending upon the results recommend additional treatment or evaluation strategies.  I will see her back in the office for follow-up evaluation.     Medication Adjustments/Labs and Tests Ordered: Current medicines are reviewed at length with the patient today.  Concerns regarding medicines are outlined above.  Medication changes, Labs and Tests ordered today are listed in the Patient Instructions below. Patient Instructions  Medication Instructions:  Your physician recommends that you continue on your current medications as directed. Please refer to the Current Medication list given to you today.  Labwork: Please return for FASTING labs (Lipid, TSH, Hepatic)  Our in office lab hours are Monday-Friday 8:00-4:00, closed for lunch 12:45-1:45 pm.  No appointment needed.  Testing/Procedures: Your physician has requested that you have an echocardiogram ASAP. Echocardiography is a painless test that uses sound waves to create images of your heart. It provides your doctor with information about the size and shape of your heart and how well your heart's chambers and valves are working. This procedure takes approximately one hour. There are no restrictions for this procedure.  This will be done at our [facility] location:  [place] has requested that you have a lexiscan myoview ASAP. For further information please visit [email or web address]. Please follow instruction sheet, as given.  Follow-Up: After testing with Dr. Claiborne Billings  Any Other Special Instructions Will Be Listed Below (If Applicable).     If you need a refill on your cardiac medications before your next appointment, please call your pharmacy.      Signed, Shelva Majestic, MD  01/01/2018 4:15 PM    [facility] [address] [place], [place], [place]  [address] Phone: [phone]

## 2017-12-28 NOTE — Patient Instructions (Signed)
Medication Instructions:  Your physician recommends that you continue on your current medications as directed. Please refer to the Current Medication list given to you today.  Labwork: Please return for FASTING labs (Lipid, TSH, Hepatic)  Our in office lab hours are Monday-Friday 8:00-4:00, closed for lunch 12:45-1:45 pm.  No appointment needed.  Testing/Procedures: Your physician has requested that you have an echocardiogram ASAP. Echocardiography is a painless test that uses sound waves to create images of your heart. It provides your doctor with information about the size and shape of your heart and how well your heart's chambers and valves are working. This procedure takes approximately one hour. There are no restrictions for this procedure.  This will be done at our [facility] location:  [place] has requested that you have a lexiscan myoview ASAP. For further information please visit [email or web address]. Please follow instruction sheet, as given.  Follow-Up: After testing with Dr. Claiborne Billings  Any Other Special Instructions Will Be Listed Below (If Applicable).     If you need a refill on your cardiac medications before your next appointment, please call your pharmacy.

## 2017-12-29 ENCOUNTER — Other Ambulatory Visit: Payer: Self-pay

## 2017-12-29 ENCOUNTER — Ambulatory Visit (HOSPITAL_COMMUNITY): Payer: No Typology Code available for payment source | Attending: Cardiovascular Disease

## 2017-12-29 ENCOUNTER — Telehealth (HOSPITAL_COMMUNITY): Payer: Self-pay

## 2017-12-29 DIAGNOSIS — R0609 Other forms of dyspnea: Secondary | ICD-10-CM | POA: Diagnosis not present

## 2017-12-29 DIAGNOSIS — Z6836 Body mass index (BMI) 36.0-36.9, adult: Secondary | ICD-10-CM | POA: Diagnosis not present

## 2017-12-29 DIAGNOSIS — I447 Left bundle-branch block, unspecified: Secondary | ICD-10-CM | POA: Insufficient documentation

## 2017-12-29 DIAGNOSIS — E669 Obesity, unspecified: Secondary | ICD-10-CM | POA: Diagnosis not present

## 2017-12-29 DIAGNOSIS — I071 Rheumatic tricuspid insufficiency: Secondary | ICD-10-CM | POA: Insufficient documentation

## 2017-12-29 DIAGNOSIS — Z853 Personal history of malignant neoplasm of breast: Secondary | ICD-10-CM | POA: Diagnosis not present

## 2017-12-29 NOTE — Progress Notes (Signed)
Chart left with Ivin Booty for f/u

## 2017-12-29 NOTE — Telephone Encounter (Signed)
Encounter complete. 

## 2017-12-30 ENCOUNTER — Ambulatory Visit (HOSPITAL_COMMUNITY)
Admission: RE | Admit: 2017-12-30 | Discharge: 2017-12-30 | Disposition: A | Discharge status: Home / Self Care | Payer: No Typology Code available for payment source | Source: Ambulatory Visit | Attending: Cardiovascular Disease | Admitting: Cardiovascular Disease

## 2017-12-30 DIAGNOSIS — R0609 Other forms of dyspnea: Secondary | ICD-10-CM | POA: Diagnosis not present

## 2017-12-30 DIAGNOSIS — I447 Left bundle-branch block, unspecified: Secondary | ICD-10-CM | POA: Diagnosis not present

## 2017-12-30 LAB — MYOCARDIAL PERFUSION IMAGING
CHL CUP NUCLEAR SRS: 3
CHL CUP NUCLEAR SSS: 8
CSEPPHR: 105 {beats}/min
LV dias vol: 111 mL (ref 46–106)
LVSYSVOL: 60 mL
NUC STRESS TID: 1.08
Rest HR: 86 {beats}/min
SDS: 5

## 2017-12-30 MED ORDER — TECHNETIUM TC 99M TETROFOSMIN IV KIT
10.0000 | PACK | Freq: Once | INTRAVENOUS | Status: AC | PRN
  Administered 2017-12-30: 10 via INTRAVENOUS
  Filled 2017-12-30: qty 10

## 2017-12-30 MED ORDER — TECHNETIUM TC 99M TETROFOSMIN IV KIT
32.2000 | PACK | Freq: Once | INTRAVENOUS | Status: AC | PRN
  Administered 2017-12-30: 32.2 via INTRAVENOUS
  Filled 2017-12-30: qty 33

## 2017-12-30 MED ORDER — REGADENOSON 0.4 MG/5ML IV SOLN
0.4000 mg | Freq: Once | INTRAVENOUS | Status: AC
  Administered 2017-12-30: 0.4 mg via INTRAVENOUS

## 2017-12-31 ENCOUNTER — Telehealth: Payer: Self-pay | Admitting: Cardiovascular Disease

## 2017-12-31 ENCOUNTER — Encounter (HOSPITAL_COMMUNITY): Admission: RE | Payer: Self-pay | Source: Ambulatory Visit

## 2017-12-31 ENCOUNTER — Ambulatory Visit (HOSPITAL_COMMUNITY): Admission: RE | Admit: 2017-12-31 | Payer: Medicare Other | Source: Ambulatory Visit | Admitting: Urology

## 2017-12-31 LAB — LIPID PANEL
CHOL/HDL RATIO: 2.9 ratio (ref 0.0–4.4)
Cholesterol, Total: 186 mg/dL (ref 100–199)
HDL: 65 mg/dL (ref >39)
LDL Calculated: 107 mg/dL — ABNORMAL HIGH (ref 0–99)
TRIGLYCERIDES: 71 mg/dL (ref 0–149)
VLDL Cholesterol Cal: 14 mg/dL (ref 5–40)

## 2017-12-31 LAB — HEPATIC FUNCTION PANEL
ALBUMIN: 3.5 g/dL (ref 3.5–4.8)
ALT: 15 IU/L (ref 0–32)
AST: 20 IU/L (ref 0–40)
Alkaline Phosphatase: 98 IU/L (ref 39–117)
BILIRUBIN, DIRECT: 0.13 mg/dL (ref 0.00–0.40)
Bilirubin Total: 0.4 mg/dL (ref 0.0–1.2)
Total Protein: 6.4 g/dL (ref 6.0–8.5)

## 2017-12-31 LAB — TSH: TSH: 2.47 u[IU]/mL (ref 0.450–4.500)

## 2017-12-31 SURGERY — CYSTOSCOPY/URETEROSCOPY/HOLMIUM LASER/STENT PLACEMENT
Anesthesia: General | Laterality: Left

## 2017-12-31 NOTE — Telephone Encounter (Signed)
Reviewed chart. Pt was seen by Dr. Claiborne Billings for clearance but he has not finished documentation yet. Unsure clearance status at this time.   Will ask Dr. Evette Georges' RN to fax clearance once complete.

## 2017-12-31 NOTE — Telephone Encounter (Signed)
Refaxed clearance to # provided.    Called and left message for Marlowe Kays, advised to call if do not receive.

## 2017-12-31 NOTE — Telephone Encounter (Signed)
New Message        [facility] with [facility] states that she does not have anything saying that the patient is cleared for surgery. Pls call and advise or fax over the clearance fax # [phone].

## 2018-01-01 ENCOUNTER — Encounter: Payer: Self-pay | Admitting: Cardiovascular Disease

## 2018-01-04 ENCOUNTER — Telehealth: Payer: Self-pay | Admitting: Cardiovascular Disease

## 2018-01-04 ENCOUNTER — Telehealth: Payer: Self-pay

## 2018-01-04 NOTE — Telephone Encounter (Signed)
New Message   [facility] with [facility] is calling because they have not received the clearance information for the patient. He came in on 12/28/2017 for a surgical clearance and saw Dr. Claiborne Billings. Please call to discuss.

## 2018-01-04 NOTE — Telephone Encounter (Signed)
Called Connie back, asked for the clearance to be faxed and once that is done I will place it in the pool so we can get accurate information that she is cleared and have it documented. Marlowe Kays will fax over sheet and we can have it completed.

## 2018-01-04 NOTE — Telephone Encounter (Signed)
   Primary Cardiologist: Shelva Majestic, MD  Chart reviewed as part of pre-operative protocol coverage.  Rebecca Porter was last seen on 12/28/17 by Dr. Claiborne Billings.  In evaluation for abnormal EKG a stress test was done on 12/30/17 that showed a defect that Dr. Claiborne Billings suspected was due to LBBB/breast attenuation. Cannot completely rule out ischemia, but doubt significant if any.Patient had EF of 55 to 60% on echo Doppler study. Based on study, okay to proceed with planned upcoming procedure per Dr. Claiborne Billings.  Therefore, based on ACC/AHA guidelines, the patient would be at acceptable risk for the planned procedure without further cardiovascular testing.  I will route this recommendation to the requesting party via Epic fax function and remove from pre-op pool.  Please call with questions.  Daune Perch, NP 01/04/2018, 3:23 PM

## 2018-01-04 NOTE — Telephone Encounter (Signed)
   [facility] Pre-operative Risk Assessment    Request for surgical clearance:  1. What type of surgery is being performed? Cystoscopy, left stent removal, left ureteroscopy, possible laser lithotripsy with possible stent placement.   2. When is this surgery scheduled? TBD   3. What type of clearance is required (medical clearance vs. Pharmacy clearance to hold med vs. Both)? Medical  4. Are there any medications that need to be held prior to surgery and how long? N/A   5. Practice name and name of physician performing surgery? [facility] - Dr. Karsten Ro  6. What is your office phone number [phone]    7.   What is your office fax number [phone]  8.   Anesthesia type (None, local, MAC, general) ? Unknown  See stress test, and clearance visit, needed documentation that she was cleared.    Rebecca Porter 01/04/2018, 12:46 PM  _________________________________________________________________   (provider comments below)

## 2018-01-05 DIAGNOSIS — G894 Chronic pain syndrome: Secondary | ICD-10-CM | POA: Diagnosis not present

## 2018-01-06 ENCOUNTER — Other Ambulatory Visit: Payer: Self-pay

## 2018-01-06 ENCOUNTER — Other Ambulatory Visit: Payer: Self-pay | Admitting: Urology

## 2018-01-06 ENCOUNTER — Encounter (HOSPITAL_COMMUNITY): Payer: Self-pay | Admitting: *Deleted

## 2018-01-06 NOTE — Progress Notes (Addendum)
Preop instructions for: Rebecca Porter                         Date of Birth : 04/17/1940                           Date of Procedure: 01/10/2018       Doctor:Dr Kathie Rhodes  Time to arrive at [facility]: 0730 Report to: Admitting  Procedure: Cystoscopy  Any procedure time changes, MD office will notify you!   Do not eat or drink past midnight the night before your procedure.(To include any tube feedings-must be discontinued)     Take these morning medications only with sips of water.(or give through gastrostomy or feeding tube). Gabapentin     Facility contact: [employer]                  Phone: [phone] or [phone]                 Health Care POA:  Transportation contact phone#: Daughter Manus Rudd- [phone] Please send day of procedure:current med list and meds last taken that day, confirm nothing by mouth status from what time, Patient Demographic info( to include DNR status, problem list, allergies)   RN contact name/phone#: Gillian Shields RN                             and Fax #: [phone]  Bring Insurance card and picture ID Leave all jewelry and other valuables at place where living( no metal or rings to be worn) No contact lens Women-no make-up, no lotions,perfumes,powders, deodorant    Any questions day of procedure,call Short Stay Center -[phone]    Sent from :[facility]                   [place]                   [phone]  Sent by :  Gillian Shields RN

## 2018-01-06 NOTE — Progress Notes (Signed)
Faxed to [facility] and received confirmation for preopo instructions for surgery on 01/10/2018.

## 2018-01-07 ENCOUNTER — Other Ambulatory Visit: Payer: Self-pay | Admitting: Urology

## 2018-01-07 NOTE — H&P (Signed)
HPI: Rebecca Porter is a 77 year-old female with a history of a left ureteral stone requiring stenting.  The patient's stone was on her left side. This is her first kidney stone. There is not a history of calculus disease in the family. She is not currently having flank pain, back pain, groin pain, nausea, vomiting, fever or chills. She does not have a burning sensation when she urinates. She has not caught a stone in her urine strainer since her symptoms began.   She has never had surgical treatment for calculi in the past.   12/07/17: She was admitted to the hospital ([facility] in [place]) with urosepsis and left ureteral stone. Her urine culture grew E. coli sensitive to ertapenem which she completed a full course of. She underwent left ureteral stenting. She was then admitted to [facility] with an epidural abscess.  She continues to have pain from her epidural abscess. She has not seen a stone pass.     ALLERGIES: cipro Macrobid penicillin    MEDICATIONS: Cranberry 450 mg capsule  Ertapenem  Escitalopram Oxalate 10 mg tablet  Gabapentin 100 mg capsule  Hydrocodone-Acetaminophen 5 mg-325 mg tablet  Megestrol Acetate 40 mg tablet  Miralax  Mirtazapine 15 mg tablet  Oxycodone Hcl 10 mg tablet  Oxycodone Hcl 5 mg capsule  Roxicodone 5 mg tablet  Senna     GU PSH: None   NON-GU PSH: None   GU PMH: None   NON-GU PMH:   Epidural abscess   Osteomyelitis (HCC)   Discitis of lumbosacral region   UTI due to extended-spectrum beta lactamase (ESBL) producing Escherichia coli   Obesity   Multiple drug allergies  FAMILY HISTORY: None   SOCIAL HISTORY: None   REVIEW OF SYSTEMS:    GU Review Female:   Patient denies frequent urination, hard to postpone urination, burning /pain with urination, get up at night to urinate, leakage of urine, stream starts and stops, trouble starting your stream, have to strain to urinate, and being pregnant.  Gastrointestinal (Upper):   Patient  denies nausea, vomiting, and indigestion/ heartburn.  Gastrointestinal (Lower):   Patient denies diarrhea and constipation.  Constitutional:   Patient denies fever, night sweats, weight loss, and fatigue.  Skin:   Patient denies skin rash/ lesion and itching.  Eyes:   Patient denies blurred vision and double vision.  Ears/ Nose/ Throat:   Patient denies sore throat and sinus problems.  Hematologic/Lymphatic:   Patient denies swollen glands and easy bruising. I note bilateral lower extremity pitting edema slightly greater on the left than the right. Cardiovascular:   Patient denies leg swelling and chest pains.  Respiratory:   Patient denies cough and shortness of breath.  Endocrine:   Patient denies excessive thirst.  Musculoskeletal:   Patient denies back pain and joint pain.  Neurological:   Patient denies headaches and dizziness.  Psychologic:   Patient denies depression and anxiety.   VITAL SIGNS:    Weight 200 lb / 90.72 kg  Height 66 in / 167.64 cm  BP 142/75 mmHg  Pulse 76 /min  BMI 32.3 kg/m   MULTI-SYSTEM PHYSICAL EXAMINATION:    Constitutional: Well-nourished. No physical deformities. Normally developed. Good grooming. Seated in a wheelchair.  Neck: Neck symmetrical, not swollen. Normal tracheal position.  Respiratory: No labored breathing, no use of accessory muscles.   Cardiovascular: Normal temperature, normal extremity pulses, no swelling, no varicosities.  Lymphatic: No enlargement of neck, axillae, groin.  Skin: No paleness, no jaundice, no cyanosis.  No lesion, no ulcer, no rash.  Neurologic / Psychiatric: Oriented to time, oriented to place, oriented to person. No depression, no anxiety, no agitation.  Gastrointestinal: Obese abdomen. No mass, no tenderness, no rigidity.   Eyes: Normal conjunctivae. Normal eyelids.  Ears, Nose, Mouth, and Throat: Left ear no scars, no lesions, no masses. Right ear no scars, no lesions, no masses. Nose no scars, no lesions, no masses.  Normal hearing. Normal lips.  Musculoskeletal: Normal gait and station of head and neck.     PAST DATA REVIEWED:  Source Of History:  Patient, Outside Source  Lab Test Review:   BUN/Creatinine, Creatinine  Records Review:   Previous Hospital Records, POC Tool  Notes:                     11/27/17 creatinine was 0.77 and serum calcium was normal at 9.0.   PROCEDURES:         KUB - [id number]  A single view of the abdomen is obtained.      Independent review of her study reveals what appears to be a calcification adjacent to the stent which is in good position. There are some pelvic calcifications that appear to possibly be phleboliths but the calcification that appears to be in her ureter is just above the ureterovesical junction adjacent to the stent.   ASSESSMENT/PLAN:      ICD-10 Details  1 GU:   Ureteral calculus - N20.1 Left, We discussed the fact that I do not think lithotripsy is indicated as I cannot say 100% that this is her ureteral stone and therefore I have recommended ureteroscopic management which would allow me to perform a retrograde once stent is removed and if this is truly her left ureteral stone I could then manage it with ureteroscopy.

## 2018-01-07 NOTE — Progress Notes (Signed)
Requested x 2 for most recent labs from [facility] and never received.

## 2018-01-10 ENCOUNTER — Ambulatory Visit (HOSPITAL_COMMUNITY): Payer: Medicare Other | Admitting: Anesthesiology

## 2018-01-10 ENCOUNTER — Encounter (HOSPITAL_COMMUNITY): Payer: Self-pay | Admitting: Emergency Medicine

## 2018-01-10 ENCOUNTER — Ambulatory Visit (HOSPITAL_COMMUNITY): Payer: Medicare Other

## 2018-01-10 ENCOUNTER — Other Ambulatory Visit: Payer: Self-pay

## 2018-01-10 ENCOUNTER — Encounter (HOSPITAL_COMMUNITY)
Admission: RE | Disposition: A | Discharge status: Home / Self Care | Payer: Self-pay | Source: Ambulatory Visit | Attending: Urology

## 2018-01-10 ENCOUNTER — Ambulatory Visit (HOSPITAL_COMMUNITY)
Admission: RE | Admit: 2018-01-10 | Discharge: 2018-01-10 | Disposition: A | Discharge status: Home / Self Care | Payer: Medicare Other | Source: Ambulatory Visit | Attending: Urology | Admitting: Urology

## 2018-01-10 DIAGNOSIS — M869 Osteomyelitis, unspecified: Secondary | ICD-10-CM | POA: Diagnosis not present

## 2018-01-10 DIAGNOSIS — K219 Gastro-esophageal reflux disease without esophagitis: Secondary | ICD-10-CM | POA: Diagnosis not present

## 2018-01-10 DIAGNOSIS — Z880 Allergy status to penicillin: Secondary | ICD-10-CM | POA: Insufficient documentation

## 2018-01-10 DIAGNOSIS — Z79891 Long term (current) use of opiate analgesic: Secondary | ICD-10-CM | POA: Diagnosis not present

## 2018-01-10 DIAGNOSIS — Z881 Allergy status to other antibiotic agents status: Secondary | ICD-10-CM | POA: Insufficient documentation

## 2018-01-10 DIAGNOSIS — N201 Calculus of ureter: Secondary | ICD-10-CM | POA: Insufficient documentation

## 2018-01-10 DIAGNOSIS — Z79899 Other long term (current) drug therapy: Secondary | ICD-10-CM | POA: Diagnosis not present

## 2018-01-10 DIAGNOSIS — Z6834 Body mass index (BMI) 34.0-34.9, adult: Secondary | ICD-10-CM | POA: Diagnosis not present

## 2018-01-10 DIAGNOSIS — N179 Acute kidney failure, unspecified: Secondary | ICD-10-CM | POA: Diagnosis not present

## 2018-01-10 HISTORY — DX: Personal history of urinary calculi: Z87.442

## 2018-01-10 HISTORY — PX: CYSTOSCOPY/URETEROSCOPY/HOLMIUM LASER/STENT PLACEMENT: SHX6546

## 2018-01-10 HISTORY — DX: Gastro-esophageal reflux disease without esophagitis: K21.9

## 2018-01-10 HISTORY — DX: Dyspnea, unspecified: R06.00

## 2018-01-10 SURGERY — CYSTOSCOPY/URETEROSCOPY/HOLMIUM LASER/STENT PLACEMENT
Anesthesia: General | Laterality: Left

## 2018-01-10 MED ORDER — LIDOCAINE 2% (20 MG/ML) 5 ML SYRINGE
INTRAMUSCULAR | Status: DC | PRN
  Administered 2018-01-10: 60 mg via INTRAVENOUS

## 2018-01-10 MED ORDER — LACTATED RINGERS IV SOLN
INTRAVENOUS | Status: DC
  Administered 2018-01-10: 09:00:00 via INTRAVENOUS

## 2018-01-10 MED ORDER — PHENAZOPYRIDINE HCL 200 MG PO TABS: 200.0000 mg | ORAL_TABLET | Freq: Three times a day (TID) | ORAL | 0 refills | Status: DC | PRN

## 2018-01-10 MED ORDER — SODIUM CHLORIDE 0.9 % IR SOLN
Status: DC | PRN
  Administered 2018-01-10: 6000 mL

## 2018-01-10 MED ORDER — SODIUM CHLORIDE 0.9 % IV SOLN
2.0000 g | Freq: Once | INTRAVENOUS | Status: AC
  Administered 2018-01-10: 2 g via INTRAVENOUS
  Filled 2018-01-10: qty 20

## 2018-01-10 MED ORDER — PROPOFOL 10 MG/ML IV BOLUS
INTRAVENOUS | Status: DC | PRN
  Administered 2018-01-10: 100 mg via INTRAVENOUS

## 2018-01-10 MED ORDER — ONDANSETRON HCL 4 MG/2ML IJ SOLN
INTRAMUSCULAR | Status: DC | PRN
  Administered 2018-01-10: 4 mg via INTRAVENOUS

## 2018-01-10 MED ORDER — IOHEXOL 300 MG/ML  SOLN
INTRAMUSCULAR | Status: DC | PRN
  Administered 2018-01-10: 14 mL

## 2018-01-10 MED ORDER — FENTANYL CITRATE (PF) 100 MCG/2ML IJ SOLN
INTRAMUSCULAR | Status: AC
  Filled 2018-01-10: qty 2

## 2018-01-10 MED ORDER — FENTANYL CITRATE (PF) 100 MCG/2ML IJ SOLN
INTRAMUSCULAR | Status: DC | PRN
  Administered 2018-01-10 (×2): 50 ug via INTRAVENOUS

## 2018-01-10 MED ORDER — PROPOFOL 10 MG/ML IV BOLUS
INTRAVENOUS | Status: AC
  Filled 2018-01-10: qty 20

## 2018-01-10 MED ORDER — DEXAMETHASONE SODIUM PHOSPHATE 10 MG/ML IJ SOLN
INTRAMUSCULAR | Status: DC | PRN
  Administered 2018-01-10: 5 mg via INTRAVENOUS

## 2018-01-10 SURGICAL SUPPLY — 18 items
BAG URO CATCHER STRL LF (MISCELLANEOUS) ×2 IMPLANT
BASKET ZERO TIP 1.9FR (BASKET) ×2 IMPLANT
BASKET ZERO TIP NITINOL 2.4FR (BASKET) IMPLANT
CATH INTERMIT  6FR 70CM (CATHETERS) ×2 IMPLANT
CLOTH BEACON ORANGE TIMEOUT ST (SAFETY) ×2 IMPLANT
EXTRACTOR STONE 1.7FRX115CM (UROLOGICAL SUPPLIES) IMPLANT
GLOVE BIOGEL M 8.0 STRL (GLOVE) ×2 IMPLANT
GOWN STRL REUS W/TWL XL LVL3 (GOWN DISPOSABLE) ×2 IMPLANT
GUIDEWIRE ANG ZIPWIRE 038X150 (WIRE) IMPLANT
GUIDEWIRE STR DUAL SENSOR (WIRE) ×2 IMPLANT
IV NS 1000ML (IV SOLUTION)
IV NS 1000ML BAXH (IV SOLUTION) IMPLANT
MANIFOLD NEPTUNE II (INSTRUMENTS) ×2 IMPLANT
PACK CYSTO (CUSTOM PROCEDURE TRAY) ×2 IMPLANT
SHEATH URETERAL 12FRX28CM (UROLOGICAL SUPPLIES) IMPLANT
SHEATH URETERAL 12FRX35CM (MISCELLANEOUS) IMPLANT
TUBING CONNECTING 10 (TUBING) ×2 IMPLANT
TUBING UROLOGY SET (TUBING) ×2 IMPLANT

## 2018-01-10 NOTE — Transfer of Care (Signed)
Immediate Anesthesia Transfer of Care Note  Patient: Rebecca Porter  Procedure(s) Performed: CYSTOSCOPY/URETEROSCOPY/RETROGRADE PYLEGRAM/STENT REMOVAL (Left )  Patient Location: PACU  Anesthesia Type:General  Level of Consciousness: sedated, patient cooperative and responds to stimulation  Airway & Oxygen Therapy: Patient Spontanous Breathing and Patient connected to face mask oxygen  Post-op Assessment: Report given to RN and Post -op Vital signs reviewed and stable  Post vital signs: Reviewed and stable  Last Vitals:  Vitals Value Taken Time  BP    Temp    Pulse 80 01/10/2018 10:39 AM  Resp    SpO2 100 % 01/10/2018 10:39 AM  Vitals shown include unvalidated device data.  Last Pain:  Vitals:   01/10/18 0829  TempSrc:   PainSc: 0-No pain      Patients Stated Pain Goal: 4 (11/46/43 1427)  Complications: No apparent anesthesia complications

## 2018-01-10 NOTE — Anesthesia Postprocedure Evaluation (Signed)
Anesthesia Post Note  Patient: Rebecca Porter  Procedure(s) Performed: Glenford Bayley STONE EXTRACTION, AND  STENT REMOVAL (Left )     Patient location during evaluation: PACU Anesthesia Type: General Level of consciousness: awake and alert Pain management: pain level controlled Vital Signs Assessment: post-procedure vital signs reviewed and stable Respiratory status: spontaneous breathing, nonlabored ventilation, respiratory function stable and patient connected to nasal cannula oxygen Cardiovascular status: blood pressure returned to baseline and stable Postop Assessment: no apparent nausea or vomiting Anesthetic complications: no    Last Vitals:  Vitals:   01/10/18 1100 01/10/18 1116  BP: (!) 163/72 (!) 151/94  Pulse: 85 86  Resp: 17 18  Temp:  36.4 C  SpO2: 100% 98%    Last Pain:  Vitals:   01/10/18 1116  TempSrc:   PainSc: 0-No pain                 Traniece Boffa S

## 2018-01-10 NOTE — Op Note (Signed)
PATIENT:  [name]  PRE-OPERATIVE DIAGNOSIS: 1.  Left ureteral stent 2.  Left ureteral calculus  POST-OPERATIVE DIAGNOSIS: Same  PROCEDURE: 1.  Removal of left ureteral stent. 2.  Left ureteroscopy and stone extraction. 3.  Left retrograde pyelogram with interpretation. 4.  Fluoroscopy use less than 1 hour.  SURGEON:  Claybon Jabs  INDICATION: Rebecca Porter is a 76 year old female who underwent urgent left ureteral stenting at an outside hospital for urosepsis with E. coli.  She is tolerating her stent.  KUB revealed calcification adjacent to the stent presumably her persistent left ureteral stone although phlebolith is certainly a possibility.  She is brought to the operating room today for removal of her stent, evaluation for the presence of a left ureteral stone and extraction present.  ANESTHESIA:  General  EBL:  Minimal  DRAINS: None  LOCAL MEDICATIONS USED:  None  SPECIMEN: Stone given to patient  Description of procedure: After informed consent the patient was taken to the operating room and placed on the table in a supine position. General anesthesia was then administered. Once fully anesthetized the patient was moved to the dorsal lithotomy position and the genitalia were sterilely prepped and draped in standard fashion. An official timeout was then performed.  The 23 French cystoscope with 30 degree lens was introduced into the bladder and the bladder was fully and systematically inspected.  There were no tumors, stones or inflammatory lesions.  The right UO was noted to be normal.  The left UO had a stent present.  The stent was grasped with alligator forceps and withdrawn through the urethral meatus and a 0.038 inch sensor guidewire was then passed through the stent and into the area of the renal pelvis under direct fluoroscopy.  This was left in place and then a 6 [place] open-ended ureteral catheter was passed through the cystoscope and to the left ureteral  orifice.  A left retrograde pyelogram was performed by injecting full-strength Omnipaque contrast through the open-ended stent and up the left ureter under direct fluoroscopy.  This revealed a filling defect in the distal ureter consistent with her previous stone.  The remainder of the ureter appeared normal as did the intrarenal collecting system.  A 6 French rigid ureteroscope was then passed under direct vision into the bladder and into the left ureteral orifice.  I advanced the scope up to the stone which was visualized, grasped with a nitinol basket and extracted without difficulty.  I then reinserted the ureteroscope into the left ureter and advanced up the ureter to be sure there were no other stones present.  Contrast is visualized the proximal portion of the ureter noting no filling defects and therefore removed the ureteroscope.  I used the cystoscope sheath then drained the bladder and the patient was awakened and taken to the recovery room in stable and satisfactory.  She tolerated the procedure well with no intraoperative complications.  PLAN OF CARE: Discharge to home after PACU  PATIENT DISPOSITION:  PACU - hemodynamically stable.

## 2018-01-10 NOTE — Discharge Instructions (Signed)
Cystoscopy patient instructions  Following a cystoscopy, a catheter (a flexible rubber tube) is sometimes left in place to empty the bladder. This may cause some discomfort or a feeling that you need to urinate. Your doctor determines the period of time that the catheter will be left in place. You may have bloody urine for two to three days (Call your doctor if the amount of bleeding increases or does not subside).  You may pass blood clots in your urine, especially if you had a biopsy. It is not unusual to pass small blood clots and have some bloody urine a couple of weeks after your cystoscopy. Again, call your doctor if the bleeding does not subside. You may have: Dysuria (painful urination) Frequency (urinating often) Urgency (strong desire to urinate)  These symptoms are common especially if medicine is instilled into the bladder or a ureteral stent is placed. Avoiding alcohol and caffeine, such as coffee, tea, and chocolate, may help relieve these symptoms. Drink plenty of water, unless otherwise instructed. Your doctor may also prescribe an antibiotic or other medicine to reduce these symptoms.  Cystoscopy results are available soon after the procedure; biopsy results usually take two to four days. Your doctor will discuss the results of your exam with you. Before you go home, you will be given specific instructions for follow-up care. Special Instructions:  1 If you are going home with a catheter in place do not take a tub bath until removed by your doctor.  2 You may resume your normal activities.  3 Do not drive or operate machinery if you are taking narcotic pain medicine.  4 Be sure to keep all follow-up appointments with your doctor.   5 Call Your Doctor If: The catheter is not draining  You have severe pain  You are unable to urinate  You have a fever over 101  You have severe bleeding  General Anesthesia, Adult, Care After These instructions provide you with information  about caring for yourself after your procedure. Your health care provider may also give you more specific instructions. Your treatment has been planned according to current medical practices, but problems sometimes occur. Call your health care provider if you have any problems or questions after your procedure. What can I expect after the procedure? After the procedure, it is common to have:  Vomiting.  A sore throat.  Mental slowness.  It is common to feel:  Nauseous.  Cold or shivery.  Sleepy.  Tired.  Sore or achy, even in parts of your body where you did not have surgery.  Follow these instructions at home: For at least 24 hours after the procedure:  Do not: ? Participate in activities where you could fall or become injured. ? Drive. ? Use heavy machinery. ? Drink alcohol. ? Take sleeping pills or medicines that cause drowsiness. ? Make important decisions or sign legal documents. ? Take care of children on your own.  Rest. Eating and drinking  If you vomit, drink water, juice, or soup when you can drink without vomiting.  Drink enough fluid to keep your urine clear or pale yellow.  Make sure you have little or no nausea before eating solid foods.  Follow the diet recommended by your health care provider. General instructions  Have a responsible adult stay with you until you are awake and alert.  Return to your normal activities as told by your health care provider. Ask your health care provider what activities are safe for you.  Take over-the-counter  and prescription medicines only as told by your health care provider.  If you smoke, do not smoke without supervision.  Keep all follow-up visits as told by your health care provider. This is important. Contact a health care provider if:  You continue to have nausea or vomiting at home, and medicines are not helpful.  You cannot drink fluids or start eating again.  You cannot urinate after 8-12  hours.  You develop a skin rash.  You have fever.  You have increasing redness at the site of your procedure. Get help right away if:  You have difficulty breathing.  You have chest pain.  You have unexpected bleeding.  You feel that you are having a life-threatening or urgent problem. This information is not intended to replace advice given to you by your health care provider. Make sure you discuss any questions you have with your health care provider. Document Released: 07/06/2000 Document Revised: 09/02/2015 Document Reviewed: 03/14/2015 Elsevier Interactive Patient Education  [employer].

## 2018-01-10 NOTE — Anesthesia Procedure Notes (Signed)
Procedure Name: LMA Insertion Performed by: Lavonne Kinderman J, CRNA Pre-anesthesia Checklist: Patient identified, Emergency Drugs available, Suction available, Patient being monitored and Timeout performed Patient Re-evaluated:Patient Re-evaluated prior to induction Oxygen Delivery Method: Circle system utilized Preoxygenation: Pre-oxygenation with 100% oxygen Induction Type: IV induction Ventilation: Mask ventilation without difficulty LMA: LMA inserted LMA Size: 4.0 Number of attempts: 1 Placement Confirmation: positive ETCO2 and breath sounds checked- equal and bilateral Tube secured with: Tape Dental Injury: Teeth and Oropharynx as per pre-operative assessment        

## 2018-01-10 NOTE — Anesthesia Preprocedure Evaluation (Signed)
Anesthesia Evaluation  Patient identified by MRN, date of birth, ID band Patient awake    Reviewed: Allergy & Precautions, NPO status , Patient's Chart, lab work & pertinent test results  Airway Mallampati: II  TM Distance: >3 FB Neck ROM: Full    Dental no notable dental hx.    Pulmonary neg pulmonary ROS,    Pulmonary exam normal breath sounds clear to auscultation       Cardiovascular negative cardio ROS Normal cardiovascular exam Rhythm:Regular Rate:Normal     Neuro/Psych negative neurological ROS  negative psych ROS   GI/Hepatic Neg liver ROS, GERD  ,  Endo/Other  Morbid obesity  Renal/GU negative Renal ROS  negative genitourinary   Musculoskeletal negative musculoskeletal ROS (+)   Abdominal   Peds negative pediatric ROS (+)  Hematology negative hematology ROS (+)   Anesthesia Other Findings   Reproductive/Obstetrics negative OB ROS                             Anesthesia Physical Anesthesia Plan  ASA: II  Anesthesia Plan: General   Post-op Pain Management:    Induction: Intravenous  PONV Risk Score and Plan: 3 and Ondansetron, Dexamethasone and Treatment may vary due to age or medical condition  Airway Management Planned: LMA  Additional Equipment:   Intra-op Plan:   Post-operative Plan: Extubation in OR  Informed Consent: I have reviewed the patients History and Physical, chart, labs and discussed the procedure including the risks, benefits and alternatives for the proposed anesthesia with the patient or authorized representative who has indicated his/her understanding and acceptance.   Dental advisory given  Plan Discussed with: CRNA and Surgeon  Anesthesia Plan Comments:         Anesthesia Quick Evaluation

## 2018-01-11 ENCOUNTER — Telehealth: Payer: Self-pay | Admitting: Cardiovascular Disease

## 2018-01-11 ENCOUNTER — Encounter (HOSPITAL_COMMUNITY): Payer: Self-pay | Admitting: Urology

## 2018-01-11 ENCOUNTER — Ambulatory Visit (INDEPENDENT_AMBULATORY_CARE_PROVIDER_SITE_OTHER): Payer: Medicare Other | Admitting: Internal Medicine

## 2018-01-11 DIAGNOSIS — M4647 Discitis, unspecified, lumbosacral region: Secondary | ICD-10-CM | POA: Diagnosis not present

## 2018-01-11 DIAGNOSIS — G062 Extradural and subdural abscess, unspecified: Secondary | ICD-10-CM

## 2018-01-11 NOTE — Telephone Encounter (Signed)
Pt states she has been in and out of hospital for the past 2 months and states she feels like she is in need to start back exercising. She was wondering if Dr. Claiborne Billings could recommend an exercise program and also any restrictions that's required. Routing to MD for recommendations.

## 2018-01-11 NOTE — Progress Notes (Signed)
[facility] for Infectious Disease  Patient Active Problem List   Diagnosis Date Noted  . Osteomyelitis ([place]) 11/29/2017  . 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 Acute kidney failure ([place]) 11/29/2017  . Multiple drug allergies 11/29/2017  . Depression 11/29/2017  . UTI due to extended-spectrum beta lactamase (ESBL) producing Escherichia coli 11/29/2017  . Bacteremia 11/29/2017  . 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 Obesity 11/26/2017    Patient's Medications  New Prescriptions   No medications on file  Previous Medications   ANTISEPTIC ORAL RINSE (BIOTENE) LIQD    10 mLs by Mouth Rinse route 3 (three) times daily as needed for dry mouth. Swish and spit   CRANBERRY 450 MG TABS    Take 450 mg by mouth daily.    ESCITALOPRAM (LEXAPRO) 10 MG TABLET    Take 1 tablet (10 mg total) by mouth daily.   GABAPENTIN (NEURONTIN) 100 MG CAPSULE    Take 100 mg by mouth 3 (three) times daily.   MIRTAZAPINE (REMERON) 15 MG TABLET    Take 1 tablet (15 mg total) by mouth at bedtime.   NAPROXEN SODIUM (ALEVE) 220 MG TABLET    Take 220 mg by mouth at bedtime.   NUTRITIONAL SUPPLEMENT LIQD    Take 60 mLs by mouth 3 (three) times daily. MedPass 2.0   OXYCODONE (OXY IR/ROXICODONE) 5 MG IMMEDIATE RELEASE TABLET    Take 5 mg by mouth 2 (two) times daily.    PHENAZOPYRIDINE (PYRIDIUM) 200 MG TABLET    Take 1 tablet (200 mg total) by mouth 3 (three) times daily as needed for pain.   POLYETHYLENE GLYCOL (MIRALAX / GLYCOLAX) PACKET    Take 17 g by mouth daily.   SENNA (SENOKOT) 8.6 MG TABS TABLET    Take 1 tablet by mouth daily.   TRAMADOL (ULTRAM) 50 MG TABLET    Take 50 mg by mouth daily as needed.  Modified Medications   No medications on file  Discontinued Medications   ERTAPENEM (INVANZ) IVPB    Inject 1 g into the vein daily. Indication:  ESBL E. Coli Discitis/Osteomyelitis/Epidural Abscess Last Day of Therapy:  01/11/18 Labs - Once weekly:  CBC/D and BMP, Labs - Every other week:  ESR  and CRP    Subjective: Rebecca Porter is in with her daughter for her hospital follow-up visit.  Ms. Ballard was hospitalized at [facility] on 11/06/2017 with nausea, vomiting and back pain.  She was found to have a left ureteral kidney stone, ESBL E. coli UTI and bacteremia.  She was treated with 2 weeks of IV ertapenem but continued to have back pain and was admitted to [facility] where she was found to have L5-S1 discitis with associated epidural phlegmon.  She was started back on ertapenem and is now completed to 6 additional weeks.  She had her kidney stone and left ureteral stent removed yesterday.  She is feeling much better.  She is still having some low back pain but she is not requiring narcotic pain medication.  She is making slow progress with physical therapy.  Review of Systems: Review of Systems  Constitutional: Negative for chills, diaphoresis and fever.  Gastrointestinal: Negative for abdominal pain, diarrhea, nausea and vomiting.  Musculoskeletal: Positive for back pain.    Past Medical History:  Diagnosis Date  . Cancer [facility])    breast cancer 2002   . Dyspnea    with exertion   . GERD (gastroesophageal reflux disease)   .  History of kidney stones   . Personal history of radiation therapy 2002  . Sepsis ([place])    due to kidney stone blocking the flow per pt. family  10-2017  IV [facility]    Social History   Tobacco Use  . Smoking status: Never Smoker  . Smokeless tobacco: Never Used  Substance Use Topics  . Alcohol use: Never    Frequency: Never  . Drug use: Never    Family History  Problem Relation Age of Onset  . Breast cancer Neg Hx     Allergies  Allergen Reactions  . Ciprofloxacin Hcl Swelling and Rash    Throat swelling  . Penicillins Anaphylaxis and Hives    Has patient had a PCN reaction causing immediate rash, facial/tongue/throat swelling, SOB or lightheadedness with hypotension: Yes Has patient had a PCN reaction causing severe rash involving mucus  membranes or skin necrosis: No Has patient had a PCN reaction that required hospitalization: unknown Has patient had a PCN reaction occurring within the last 10 years: No If all of the above answers are "NO", then may proceed with Cephalosporin use.  Santiago Bur [Nitrofurantoin Macrocrystal] Other (See Comments)    Unknown reaction  . Protonix [Pantoprazole Sodium] Other (See Comments)    Unknown reaction    Objective: Vitals:   01/11/18 1055  BP: (!) 141/81  Pulse: 78  Temp: 98.4 F (36.9 C)  Weight: 218 lb (98.9 kg)  Height: _0  (1.676 m)   Body mass index is 35.19 kg/m.  Physical Exam  Constitutional: She is oriented to person, place, and time.  She is in very good spirits.  She is seated in a wheelchair.  Abdominal: Soft. She exhibits no distension. There is no tenderness.  Neurological: She is alert and oriented to person, place, and time.  Skin: No rash noted.  Right arm PICC site looks good.  Psychiatric: She has a normal mood and affect.    Lab Results    Problem List Items Addressed This Visit      Unprioritized   Epidural abscess   Relevant Orders   C-reactive protein   Sedimentation rate   Discitis of lumbosacral region    I am hopeful that her vertebral infection has been cured following a total of 8 weeks of IV antibiotic therapy.  Her PICC line was removed here today after blood was drawn for sed rate and C-reactive protein.  She will follow-up here in 4 weeks.          Michel Bickers, MD [facility] for [facility] [phone] pager   [phone] cell 01/11/2018, 11:48 AM

## 2018-01-11 NOTE — Telephone Encounter (Signed)
Initiate with at least trying to walk.  Could also try stationary bike or other aerobic-like exercises; try stretching to improve flexibility.

## 2018-01-11 NOTE — Telephone Encounter (Signed)
New message:      Pt's sister is wondering is there some type of exercise or programs that the pt could be doing until her appt on 02/11/18 with Claiborne Billings

## 2018-01-11 NOTE — Progress Notes (Signed)
Per verbal order from Dr Megan Salon, 39 cm Single Lumen Peripherally Inserted Central Catheter removed from right basilic, tip intact. No sutures present. RN confirmed length per chart. Dressing was clean and dry. Petroleum dressing applied. Pt advised no heavy lifting with this arm, leave dressing for 24 hours and call the office or seek emergent care if dressing becomes soaked with blood or swelling or sharp pain presents. Patient and daughter verbalized understanding and agreement.  Patient's questions answered to their satisfaction. Patient tolerated procedure well. Cassie Kuppelweiser notified. Landis Gandy, RN

## 2018-01-11 NOTE — Assessment & Plan Note (Signed)
I am hopeful that her vertebral infection has been cured following a total of 8 weeks of IV antibiotic therapy.  Her PICC line was removed here today after blood was drawn for sed rate and C-reactive protein.  She will follow-up here in 4 weeks.

## 2018-01-12 DIAGNOSIS — R945 Abnormal results of liver function studies: Secondary | ICD-10-CM | POA: Diagnosis not present

## 2018-01-12 DIAGNOSIS — N390 Urinary tract infection, site not specified: Secondary | ICD-10-CM | POA: Diagnosis not present

## 2018-01-12 DIAGNOSIS — N179 Acute kidney failure, unspecified: Secondary | ICD-10-CM | POA: Diagnosis not present

## 2018-01-12 DIAGNOSIS — D696 Thrombocytopenia, unspecified: Secondary | ICD-10-CM | POA: Diagnosis not present

## 2018-01-12 DIAGNOSIS — R54 Age-related physical debility: Secondary | ICD-10-CM | POA: Diagnosis not present

## 2018-01-12 DIAGNOSIS — R030 Elevated blood-pressure reading, without diagnosis of hypertension: Secondary | ICD-10-CM | POA: Diagnosis not present

## 2018-01-12 DIAGNOSIS — F320 Major depressive disorder, single episode, mild: Secondary | ICD-10-CM | POA: Diagnosis not present

## 2018-01-12 DIAGNOSIS — M6281 Muscle weakness (generalized): Secondary | ICD-10-CM | POA: Diagnosis not present

## 2018-01-12 DIAGNOSIS — A419 Sepsis, unspecified organism: Secondary | ICD-10-CM | POA: Diagnosis not present

## 2018-01-12 DIAGNOSIS — N209 Urinary calculus, unspecified: Secondary | ICD-10-CM | POA: Diagnosis not present

## 2018-01-12 DIAGNOSIS — M4316 Spondylolisthesis, lumbar region: Secondary | ICD-10-CM | POA: Diagnosis not present

## 2018-01-12 DIAGNOSIS — F4323 Adjustment disorder with mixed anxiety and depressed mood: Secondary | ICD-10-CM | POA: Diagnosis not present

## 2018-01-12 DIAGNOSIS — R509 Fever, unspecified: Secondary | ICD-10-CM | POA: Diagnosis not present

## 2018-01-12 DIAGNOSIS — D72829 Elevated white blood cell count, unspecified: Secondary | ICD-10-CM | POA: Diagnosis not present

## 2018-01-12 LAB — SEDIMENTATION RATE: Sed Rate: 9 mm/h (ref 0–30)

## 2018-01-12 LAB — C-REACTIVE PROTEIN: CRP: 6.9 mg/L (ref <8.0)

## 2018-01-12 NOTE — Telephone Encounter (Signed)
Pt updated with Dr. Evette Georges recommendation. Verbalized understanding.

## 2018-01-14 DIAGNOSIS — M1711 Unilateral primary osteoarthritis, right knee: Secondary | ICD-10-CM | POA: Diagnosis not present

## 2018-01-20 DIAGNOSIS — R110 Nausea: Secondary | ICD-10-CM | POA: Diagnosis not present

## 2018-02-11 ENCOUNTER — Encounter: Payer: Self-pay | Admitting: Cardiovascular Disease

## 2018-02-11 ENCOUNTER — Ambulatory Visit (INDEPENDENT_AMBULATORY_CARE_PROVIDER_SITE_OTHER): Payer: Medicare Other | Admitting: Cardiovascular Disease

## 2018-02-11 VITALS — BP 169/96 | HR 90 | Ht 66.0 in | Wt 204.4 lb

## 2018-02-11 DIAGNOSIS — R600 Localized edema: Secondary | ICD-10-CM | POA: Diagnosis not present

## 2018-02-11 DIAGNOSIS — I10 Essential (primary) hypertension: Secondary | ICD-10-CM | POA: Diagnosis not present

## 2018-02-11 DIAGNOSIS — I447 Left bundle-branch block, unspecified: Secondary | ICD-10-CM | POA: Diagnosis not present

## 2018-02-11 DIAGNOSIS — R0609 Other forms of dyspnea: Secondary | ICD-10-CM | POA: Diagnosis not present

## 2018-02-11 DIAGNOSIS — N200 Calculus of kidney: Secondary | ICD-10-CM

## 2018-02-11 MED ORDER — METOPROLOL SUCCINATE ER 25 MG PO TB24: 25.0000 mg | ORAL_TABLET | Freq: Every day | ORAL | 3 refills | Status: DC

## 2018-02-11 NOTE — Patient Instructions (Signed)
Medication Instructions:  START metoprolol succinate (Toprol XL) 25 mg daily  If you need a refill on your cardiac medications before your next appointment, please call your pharmacy.   Follow-Up: At [facility], you and your health needs are our priority.  As part of our continuing mission to provide you with exceptional heart care, we have created designated Provider Care Teams.  These Care Teams include your primary Cardiologist (physician) and Advanced Practice Providers (APPs -  Physician Assistants and Nurse Practitioners) who all work together to provide you with the care you need, when you need it. You will need a follow up appointment in 6 months.  Please call our office 2 months in advance to schedule this appointment.  You may see Thomas Kelly, MD or one of the following Advanced Practice Providers on your designated Care Team: Hao Meng, PA-C . Angela Duke, PA-C    

## 2018-02-11 NOTE — Progress Notes (Signed)
Cardiology Office Note    Date:  02/13/2018   ID:  Rebecca Porter, DOB 25-Dec-1940, MRN [id number]  PCP:  Chesley Noon, MD  Cardiologist:  Shelva Majestic, MD   F/U cardiololgy evaluation   Chief Complaint  Patient presents with  . Follow-up    History of Present Illness:  Rebecca Porter is a 77 y.o. female who presents to the office today for FU etiology evaluation.  I had initially seen her for cardiology clearance prior to urologic surgery.  Rebecca Porter has a history of a left kidney stone leading to hospitalization in July 2019 with urosepsis secondary to E. coli sensitive to ertapenem.  She subsequently developed low back pain and was felt to have L4-L5 epidural abscess and discitis leading to her hospitalization at [facility] in August 2019.  She was evaluated by neurology and had a PICC line inserted for 6 weeks of antibiotic therapy.  She has noticed mild shortness of breath and progressive exertional dyspnea but denies any definitive episodes of chest pain or palpitations.  She is in need to undergo urologic surgery and audiology clearance was recommended prior to surgery due to new left bundle branch block.  Of note, the patient states that she was evaluated July and ECG at that time was done.  I was able to obtain the ECG report but not see the tracing and reportedly this was interpreted as sinus tachycardia with anteroseptal infarct, age undetermined, ST abnormality with possible lateral subendocardial injury and was felt to be abnormal without prior ECG available for comparison.  I saw her for initial cardiology evaluation, she denied any episodes of chest pressure prior MI but admitted to mild progressive shortness of breath with exertion and had noticed some leg swelling.  Preoperative echo Doppler study and nuclear stress testing.  Her nuclear stress test revealed an EF of 46%.  There were no ECG changes.  There was a medium defect of moderate severity in the anteroseptal septal  wall and it was felt most likely this was due to left bundle branch block attenuation artifact but anteroseptal ischemia could not be completely excluded.  An echo Doppler study revealed an ejection fraction at 55% without wall motion abnormalities.  She had mild PA pressure elevation at 41 mm.  She was given clearance to undergo her urologic surgery which she was able to tolerate well without cardiovascular compromise.  She states she has lost 34 pounds over the past 4 months.  She did not tolerate lisinopril due to cough.  He denies recent chest pain or shortness of breath.  She presents for reevaluation. Past Medical History:  Diagnosis Date  . Cancer [facility])    breast cancer 2002   . Dyspnea    with exertion   . GERD (gastroesophageal reflux disease)   . History of kidney stones   . Personal history of radiation therapy 2002  . Sepsis ([place])    due to kidney stone blocking the flow per pt. family  10-2017  IV [facility]    Past Surgical History:  Procedure Laterality Date  . BREAST BIOPSY Left    malignant stereo   . BREAST LUMPECTOMY Left 2002  . CHOLECYSTECTOMY     1997  . CYSTOSCOPY     Left stone removal 12-31-17  . CYSTOSCOPY/URETEROSCOPY/HOLMIUM LASER/STENT PLACEMENT Left 01/10/2018   Procedure: CYSTOSCOPY, URETEROSCOPY,RETROGRADE PYLEGRAM,BASKET STONE EXTRACTION, AND  STENT REMOVAL;  Surgeon: Kathie Rhodes, MD;  Location: [facility];  Service: Urology;  Laterality: Left;  .  IR LUMBAR DISC ASPIRATION W/IMG GUIDE  11/29/2017   abscess on spine  between L5 and S1   On IV Invanz   . RENAL ARTERY STENT     left November 07 2017    Current Medications: Outpatient Medications Prior to Visit  Medication Sig Dispense Refill  . ondansetron (ZOFRAN-ODT) 4 MG disintegrating tablet Take 4 mg by mouth 3 (three) times daily as needed.  0  . traMADol (ULTRAM) 50 MG tablet Take 50 mg by mouth daily as needed.    Marland Kitchen antiseptic oral rinse (BIOTENE) LIQD 10 mLs by Mouth Rinse route 3 (three) times daily as  needed for dry mouth. Swish and spit    . Cranberry 450 MG TABS Take 450 mg by mouth daily.     Marland Kitchen escitalopram (LEXAPRO) 10 MG tablet Take 1 tablet (10 mg total) by mouth daily. 4 tablet 0  . gabapentin (NEURONTIN) 100 MG capsule Take 100 mg by mouth 3 (three) times daily.    . mirtazapine (REMERON) 15 MG tablet Take 1 tablet (15 mg total) by mouth at bedtime. 4 tablet 0  . naproxen sodium (ALEVE) 220 MG tablet Take 220 mg by mouth at bedtime.    Marland Kitchen NUTRITIONAL SUPPLEMENT LIQD Take 60 mLs by mouth 3 (three) times daily. MedPass 2.0    . oxyCODONE (OXY IR/ROXICODONE) 5 MG immediate release tablet Take 5 mg by mouth 2 (two) times daily.     . phenazopyridine (PYRIDIUM) 200 MG tablet Take 1 tablet (200 mg total) by mouth 3 (three) times daily as needed for pain. 8 tablet 0  . polyethylene glycol (MIRALAX / GLYCOLAX) packet Take 17 g by mouth daily. 14 each 0  . senna (SENOKOT) 8.6 MG TABS tablet Take 1 tablet by mouth daily.     No facility-administered medications prior to visit.      Allergies:   Ciprofloxacin hcl; Penicillins; Macrobid [nitrofurantoin macrocrystal]; and Protonix [pantoprazole sodium]   Social History   Socioeconomic History  . Marital status: Widowed    Spouse name: Not on file  . Number of children: Not on file  . Years of education: Not on file  . Highest education level: Not on file  Occupational History  . Not on file  Social Needs  . Financial resource strain: Not on file  . Food insecurity:    Worry: Not on file    Inability: Not on file  . Transportation needs:    Medical: Not on file    Non-medical: Not on file  Tobacco Use  . Smoking status: Never Smoker  . Smokeless tobacco: Never Used  Substance and Sexual Activity  . Alcohol use: Never    Frequency: Never  . Drug use: Never  . Sexual activity: Not Currently  Lifestyle  . Physical activity:    Days per week: Not on file    Minutes per session: Not on file  . Stress: Not on file  Relationships   . Social connections:    Talks on phone: Not on file    Gets together: Not on file    Attends religious service: Not on file    Active member of club or organization: Not on file    Attends meetings of clubs or organizations: Not on file    Relationship status: Not on file  Other Topics Concern  . Not on file  Social History Narrative  . Not on file    Social history is notable that she is widowed for 10 years.  She has 2 children, 4 grandchildren, and 2 great-grandchildren.  She is self-employed and is in [employer].  There is no tobacco history.  She does not exercise.  There is no alcohol use.  Family History:  The patient's family history is not on file.   Family history is notable for mother dying at age 48 with colon cancer.  Her father died at age 56 but has suffered a massive heart attack in 26.    ROS General: Negative; No fevers, chills, or night sweats;  HEENT: Negative; No changes in vision or hearing, sinus congestion, difficulty swallowing Pulmonary: Negative; No cough, wheezing, shortness of breath, hemoptysis Cardiovascular: Positive for exertional dyspnea.  No chest pain or palpitations; no awareness of prior MI; positive for leg swelling GI: Negative; No nausea, vomiting, diarrhea, or abdominal pain GU: Positive for recent urosepsis and left kidney stone with subsequent stenting Musculoskeletal: Right knee discomfort which has limited her activity. Hematologic/Oncology: Negative; no easy bruising, bleeding Endocrine: Negative; no heat/cold intolerance; no diabetes Neuro: Sciatica symptoms from the spine abscess Skin: Negative; No rashes or skin lesions Psychiatric: Negative; No behavioral problems, depression Sleep: Negative; No snoring, daytime sleepiness, hypersomnolence, bruxism, restless legs, hypnogognic hallucinations, no cataplexy Other comprehensive 14 point system review is negative.   PHYSICAL EXAM:   VS:  BP (!) 169/96   Pulse 90   Ht  _0  (1.676 m)   Wt 204 lb 6.4 oz (92.7 kg)   BMI 32.99 kg/m     Blood pressure by me was elevated at 165/90  Wt Readings from Last 3 Encounters:  02/11/18 204 lb 6.4 oz (92.7 kg)  01/11/18 218 lb (98.9 kg)  01/10/18 216 lb 9.6 oz (98.2 kg)    General: Alert, oriented, no distress.  Skin: normal turgor, no rashes, warm and dry HEENT: Normocephalic, atraumatic. Pupils equal round and reactive to light; sclera anicteric; extraocular muscles intact;  Nose without nasal septal hypertrophy Mouth/Parynx benign; Mallinpatti scale 3 Neck: No JVD, no carotid bruits; normal carotid upstroke Lungs: clear to ausculatation and percussion; no wheezing or rales Chest wall: without tenderness to palpitation Heart: PMI not displaced, RRR, s1 s2 normal, 1/6 systolic murmur, no diastolic murmur, no rubs, gallops, thrills, or heaves Abdomen: soft, nontender; no hepatosplenomehaly, BS+; abdominal aorta nontender and not dilated by palpation. Back: no CVA tenderness Pulses 2+ Musculoskeletal: full range of motion, normal strength, no joint deformities Extremities: Right lower extremity edema, improved from prior evaluation no clubbing cyanosis,  Homan's sign negative  Neurologic: grossly nonfocal; Cranial nerves grossly wnl Psychologic: Normal mood and affect    Studies/Labs Reviewed:   EKG:  EKG is ordered today.  ECG (independently read by me): Normal sinus rhythm at 90 bpm.  Left bundle branch block with repolarization changes.  QTc interval 481 Rebecca.  December 28, 2017 ECG (independently read by me): Normal sinus rhythm at 88 bpm.  Left bundle branch block with repolarization changes.  Poor R wave progression V1 through V6.  December 22, 2017 ECG (independently reviewed by me): Normal sinus rhythm.  Left bundle branch block with repolarization changes.   Recent Labs: BMP Latest Ref Rng & Units 12/22/2017 11/27/2017 11/26/2017  Glucose 70 - 99 mg/dL 89 91 100(H)  BUN 8 - 23 mg/dL 22 24(H) 27(H)   Creatinine 0.44 - 1.00 mg/dL 0.85 0.77 0.77  Sodium 135 - 145 mmol/L 143 138 139  Potassium 3.5 - 5.1 mmol/L 3.7 4.1 4.3  Chloride 98 - 111 mmol/L 108 108 108  CO2 22 - 32 mmol/L _0 Calcium 8.9 - 10.3 mg/dL 9.6 9.0 9.1     Hepatic Function Latest Ref Rng & Units 12/30/2017 11/26/2017  Total Protein 6.0 - 8.5 g/dL 6.4 6.0(L)  Albumin 3.5 - 4.8 g/dL 3.5 2.4(L)  AST 0 - 40 IU/L 20 16  ALT 0 - 32 IU/L 15 21  Alk Phosphatase 39 - 117 IU/L 98 145(H)  Total Bilirubin 0.0 - 1.2 mg/dL 0.4 0.6  Bilirubin, Direct 0.00 - 0.40 mg/dL 0.13 -    CBC Latest Ref Rng & Units 12/22/2017 11/27/2017 11/26/2017  WBC 4.0 - 10.5 K/uL 6.7 9.5 11.5(H)  Hemoglobin 12.0 - 15.0 g/dL 11.1(L) 9.5(L) 9.7(L)  Hematocrit 36.0 - 46.0 % 34.3(L) 29.4(L) 30.2(L)  Platelets 150 - 400 K/uL 222 259 237   Lab Results  Component Value Date   MCV 93.5 12/22/2017   MCV 93.9 11/27/2017   MCV 94.4 11/26/2017   Lab Results  Component Value Date   TSH 2.470 12/30/2017   No results found for: HGBA1C   BNP No results found for: BNP  ProBNP No results found for: PROBNP   Lipid Panel     Component Value Date/Time   CHOL 186 12/30/2017 1022   TRIG 71 12/30/2017 1022   HDL 65 12/30/2017 1022   CHOLHDL 2.9 12/30/2017 1022   LDLCALC 107 (H) 12/30/2017 1022     RADIOLOGY: No results found.   Additional studies/ records that were reviewed today include:  Reviewed the patient's hospital records and obtained interpretation analysis of a prior ECG.  Recent laboratory was reviewed.  ASSESSMENT:    1. Essential hypertension   2. DOE (dyspnea on exertion)   3. Bilateral lower extremity edema   4. Kidney stone on left side   5. LBBB (left bundle branch block)      PLAN:  Rebecca Porter is a 77 year old female who denies any awareness of prior cardiac history.  She had developed recent urosepsis from a left kidney stone which was culture positive for extended spectrum beta-lactamase (ESBL) E. coli  requiring PICC line insertion and 6 weeks of antibiotic therapy.  She has multiple allergies and required ertapenem antibiotic therapy.  Her ECG reported in July from [place] raise the possibility of age-indeterminate anteroseptal MI with possible lateral subendocardial injury.  Her ECG when I initially evaluated her revealed sinus rhythm with poor R wave progression and left bundle branch block with repolarization changes.  She had experienced exertional dyspnea and increased shortness of breath as well as leg edema.  Underwent a nuclear perfusion study and echo Doppler evaluation.  On echo, EF was normal without significant valvular pathology there was mild grade 1 diastolic dysfunction.  She had mild TR and mild increased PA pressure.  Her nuclear perfusion study was low risk but showed a defect in the anteroseptal wall most likely due to her left bundle branch block.  Presently she has not had symptoms of chest pain and her shortness of breath has improved.  She tolerated her urologic surgery without cardiovascular compromise.  Her blood pressure today is elevated.  She apparently did not tolerate lisinopril.  With her resting pulse of 90 I am adding Toprol 25 mg daily which will be helpful both for blood pressure control as well as potential anti-ischemic benefit.  She will be seeing her primary MD in November.  I will see her in 6 months for cardiology reevaluation.   Medication Adjustments/Labs and Tests Ordered: Current medicines are  reviewed at length with the patient today.  Concerns regarding medicines are outlined above.  Medication changes, Labs and Tests ordered today are listed in the Patient Instructions below. Patient Instructions  Medication Instructions:  START metoprolol succinate (Toprol XL) 25 mg daily  If you need a refill on your cardiac medications before your next appointment, please call your pharmacy.   Follow-Up: At [facility], you and your health needs are our priority.   As part of our continuing mission to provide you with exceptional heart care, we have created designated Provider Care Teams.  These Care Teams include your primary Cardiologist (physician) and Advanced Practice Providers (APPs -  Physician Assistants and Nurse Practitioners) who all work together to provide you with the care you need, when you need it. You will need a follow up appointment in 6 months.  Please call our office 2 months in advance to schedule this appointment.  You may see Shelva Majestic, MD or one of the following Advanced Practice Providers on your designated Care Team: [place], [place] . Fabian Sharp, PA-C       Signed, Shelva Majestic, MD  02/13/2018 9:45 PM    [facility] [address] [place], [place], [place]  [address] Phone: [phone]

## 2018-02-13 ENCOUNTER — Encounter: Payer: Self-pay | Admitting: Cardiovascular Disease

## 2018-02-18 DIAGNOSIS — R110 Nausea: Secondary | ICD-10-CM | POA: Diagnosis not present

## 2018-02-18 DIAGNOSIS — R350 Frequency of micturition: Secondary | ICD-10-CM | POA: Diagnosis not present

## 2018-02-18 DIAGNOSIS — R030 Elevated blood-pressure reading, without diagnosis of hypertension: Secondary | ICD-10-CM | POA: Diagnosis not present

## 2018-02-18 DIAGNOSIS — L309 Dermatitis, unspecified: Secondary | ICD-10-CM | POA: Diagnosis not present

## 2018-02-18 DIAGNOSIS — M549 Dorsalgia, unspecified: Secondary | ICD-10-CM | POA: Diagnosis not present

## 2018-02-18 DIAGNOSIS — Z79899 Other long term (current) drug therapy: Secondary | ICD-10-CM | POA: Diagnosis not present

## 2018-05-26 DIAGNOSIS — J111 Influenza due to unidentified influenza virus with other respiratory manifestations: Secondary | ICD-10-CM | POA: Diagnosis not present

## 2018-06-03 DIAGNOSIS — R030 Elevated blood-pressure reading, without diagnosis of hypertension: Secondary | ICD-10-CM | POA: Diagnosis not present

## 2018-06-03 DIAGNOSIS — E785 Hyperlipidemia, unspecified: Secondary | ICD-10-CM | POA: Diagnosis not present

## 2018-06-03 DIAGNOSIS — B379 Candidiasis, unspecified: Secondary | ICD-10-CM | POA: Diagnosis not present

## 2018-06-03 DIAGNOSIS — M549 Dorsalgia, unspecified: Secondary | ICD-10-CM | POA: Diagnosis not present

## 2018-06-03 DIAGNOSIS — Z23 Encounter for immunization: Secondary | ICD-10-CM | POA: Diagnosis not present

## 2018-06-03 DIAGNOSIS — Z0000 Encounter for general adult medical examination without abnormal findings: Secondary | ICD-10-CM | POA: Diagnosis not present

## 2018-08-09 ENCOUNTER — Telehealth: Payer: Self-pay | Admitting: Cardiovascular Disease

## 2018-08-09 NOTE — Telephone Encounter (Signed)
For when you call patient for 4/30.  Thanks!

## 2018-08-09 NOTE — Telephone Encounter (Signed)
New Message   Patient has smartphone and computer and agreed to virtual visit please call to help setup.

## 2018-08-09 NOTE — Telephone Encounter (Signed)
Mychart link sent via e-mail, smartphone, pre reg complete 08/09/18 AF

## 2018-08-11 ENCOUNTER — Telehealth: Payer: Medicare Other | Admitting: Cardiovascular Disease

## 2018-08-21 IMAGING — MR MR SACRUM / SI JOINTS WO/W CM
4 of 9 series · 15 of 48 positions shown · IV contrast (multihance)
Comparison: None.

EXAM:
MRI LUMBAR SPINE WITHOUT AND WITH CONTRAST

MRI SACRUM WITHOUT AND WITH CONTRAST
TECHNIQUE: Multiplanar and multiecho pulse sequences of the lumbar spine and
sacrum were obtained without and with intravenous contrast.
CONTRAST:  20mL MULTIHANCE GADOBENATE DIMEGLUMINE 529 MG/ML IV SOLN

[Series 8: T1 · oblique · 4.0mm · 0.51mm/px · 5 of 42 slices shown (1 of 2)]
[im 1/42]
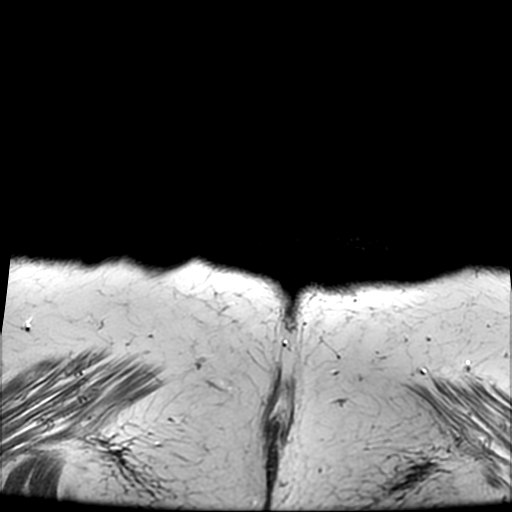
[im 11/42]
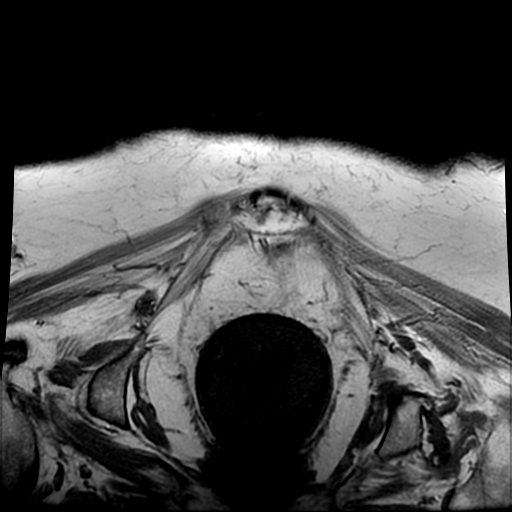
[im 21/42]
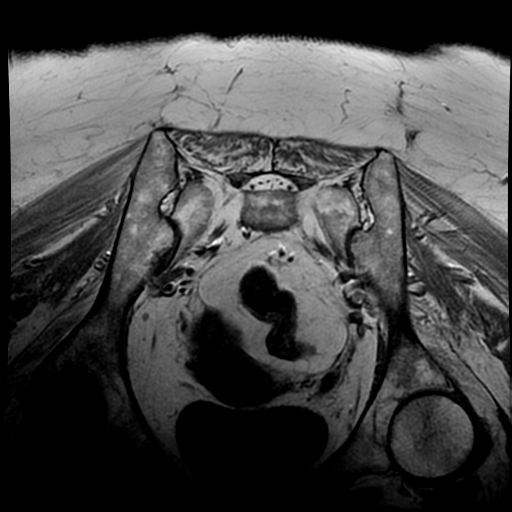
[im 31/42]
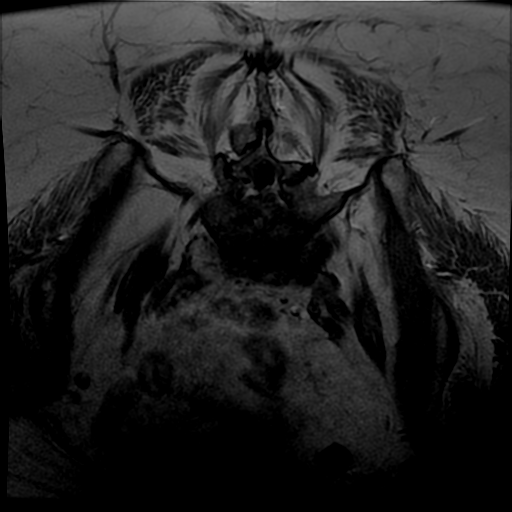
[im 42/42]
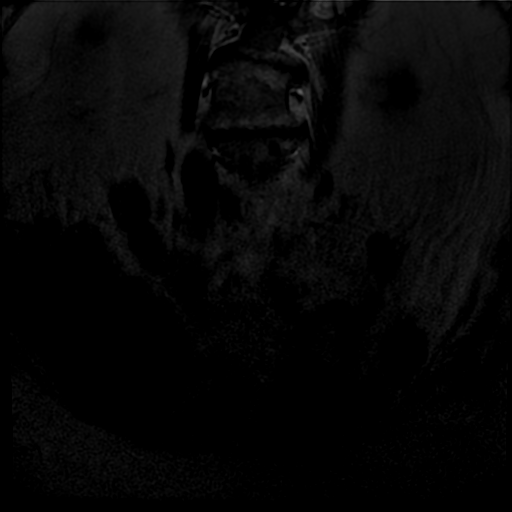

[Series 9: T1 fat-sat · oblique · 4.0mm · 0.51mm/px · 4 of 42 slices shown (1 of 2)]
[im 1/42]
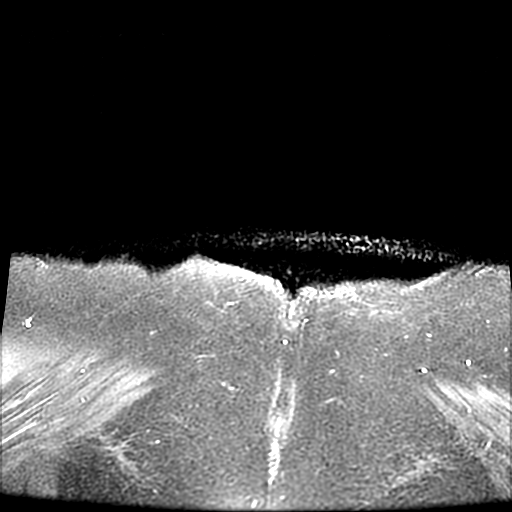
[im 9/42]
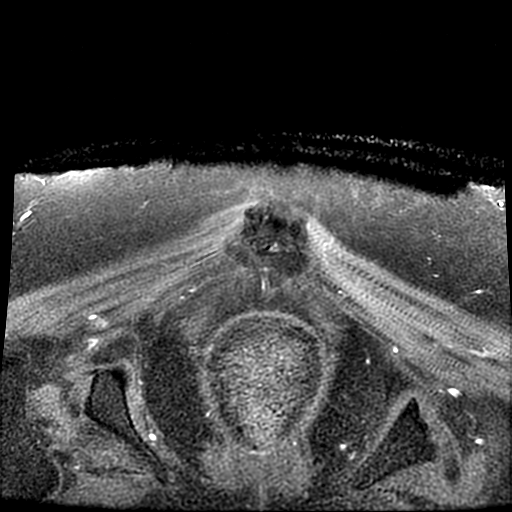
[im 25/42]
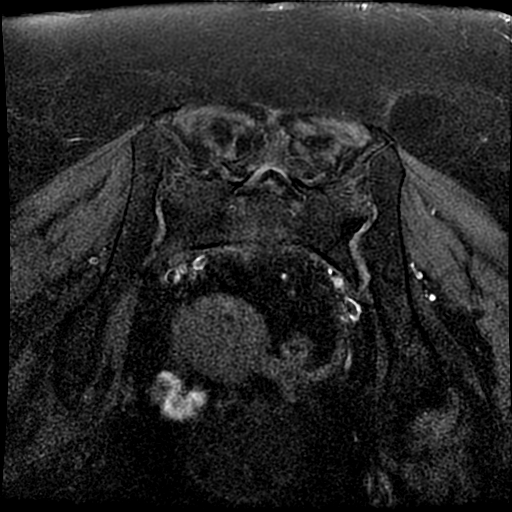
[im 42/42]
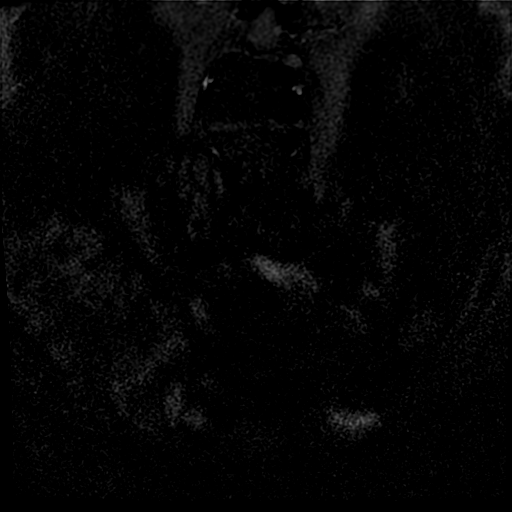

[Series 12: T1 · axial · 3.0mm · 0.39mm/px · z∈[-23,+63]mm · 3 of 36 slices shown (2 of 2)]
[im 1/36]
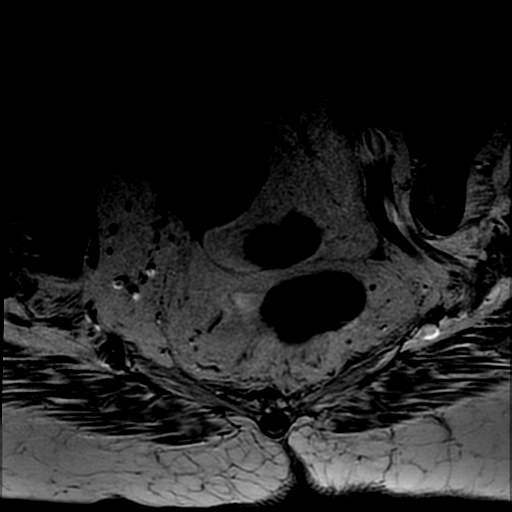
[im 18/36]
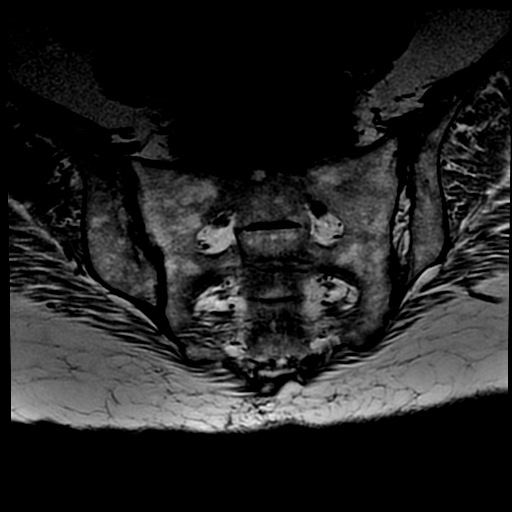
[im 36/36]
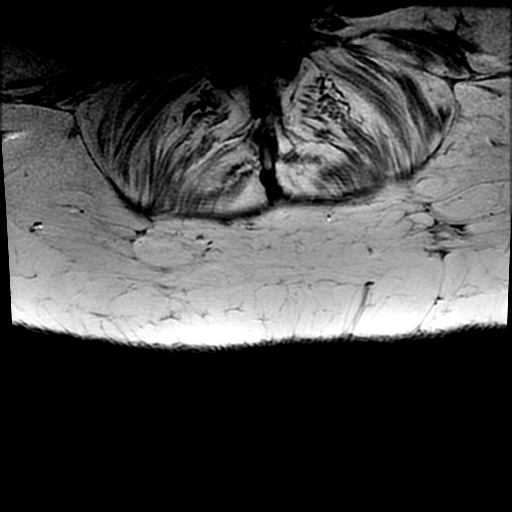

[Series 14: T1 fat-sat · oblique · 4.0mm · 0.51mm/px · 3 of 42 slices shown (2 of 2)]
[im 9/42]
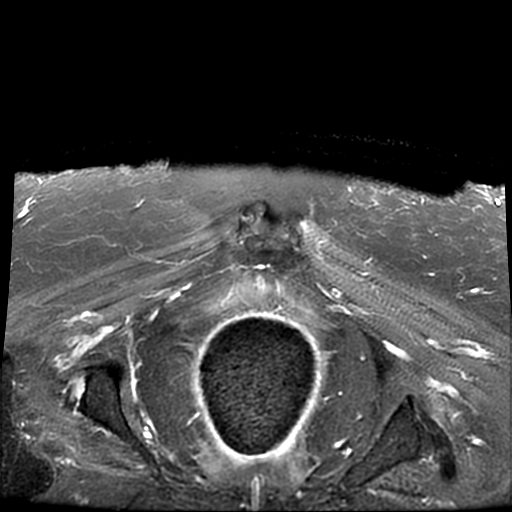
[im 25/42]
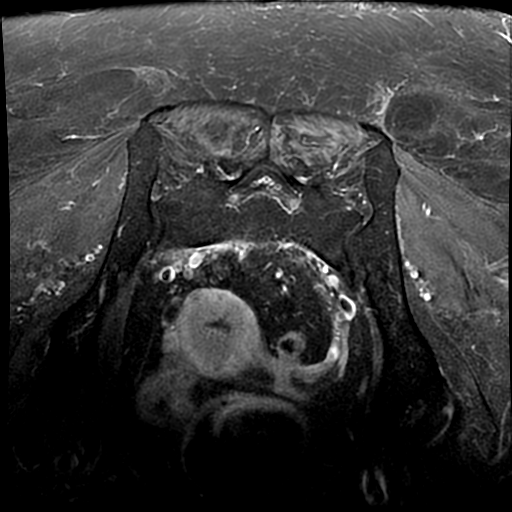
[im 42/42]
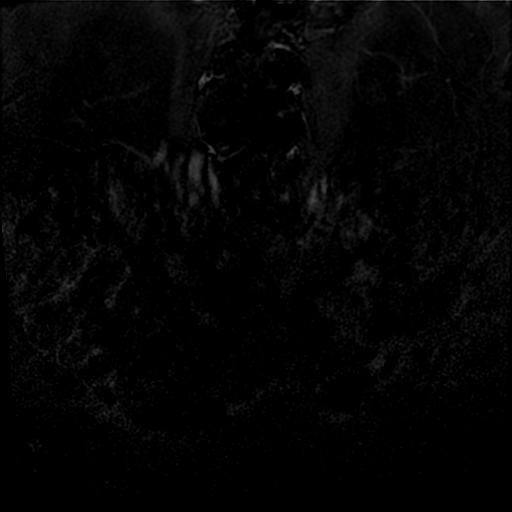

[15 of 48 positions shown; findings below may reference images not displayed]

FINDINGS: MRI LUMBAR SPINE FINDINGS:

Segmentation: Normal segmentation. Lowest well-formed disc labeled
the L5-S1 level.

Alignment: Chronic bilateral pars defects at L5 with associated 9 mm
spondylolisthesis. Mild right convex scoliosis. Trace retrolisthesis
of T12 on L1.

Vertebrae: Vertebral body heights maintained without evidence for
acute or chronic fracture. Bone marrow signal intensity diffusely
heterogeneous with extensive chronic reactive endplate changes seen
at multiple levels. No worrisome osseous lesions.

Abnormal edema with enhancement within the L5-S1 interspace,
concerning for acute discitis. Marrow edema and enhancement within
the adjacent L5 and S1 vertebral bodies compatible with associated
osteomyelitis. Disc space height loss and mild endplate irregularity
at this level. Mild associated paraspinous edema and phlegmon
without discernible paraspinous abscess. Abnormal enhancing soft
tissue within the ventral epidural space extending from L3-4 through
S1 consistent with epidural phlegmon (series 20, image 8). Possible
tiny 5 mm epidural abscess within the right ventral epidural space
at the level of L4-5, best seen on sacral portion of this exam
(series 14, image 9). Possible additional 9 mm epidural abscess at
the left ventral epidural space/left lateral recess at the level of
L5-S1 (series 14, image 12). These are more difficult to see on
lumbar spine portion of this study. Phlegmon and enhancement extends
into the bilateral foramina at L4-5 and L5-S1. Faint edema with
enhancement within the posterior aspect of the L4-5 disc suspicious
for possible early discitis at this level as well (series 20, image
8).

No other evidence for acute infection within the lumbar spine. No
findings to suggest septic arthritis.

Conus medullaris and cauda equina: Conus extends to the T12-L1
level. Conus and cauda equina appear normal.

Paraspinal and other soft tissues: Changes related to osteomyelitis
discitis at L5-S1 as above. Paraspinous soft tissues demonstrate no
other acute abnormality. Scattered renal cysts noted bilaterally.
Circumaortic left renal vein noted. Visualized soft tissues and
visceral structures otherwise unremarkable.

Disc levels:

T11-12: Chronic degenerative intervertebral disc space narrowing
with diffuse disc bulge, disc desiccation, and reactive endplate
changes. Right-sided facet hypertrophy. No significant stenosis.

T12-L1: Trace retrolisthesis. Diffuse disc bulge with disc
desiccation and intervertebral disc space narrowing. Chronic
reactive endplate changes. Mild facet hypertrophy. Resultant mild
canal with moderate left lateral recess narrowing. Mild to moderate
bilateral foraminal stenosis.

L1-2: Chronic intervertebral disc space narrowing with diffuse disc
bulge and disc desiccation. Reactive endplate changes. Moderate
facet arthrosis. Moderate canal with left greater than right lateral
recess narrowing. Mild left L1 foraminal stenosis.

L2-3: Chronic intervertebral disc space narrowing with diffuse disc
bulge and disc desiccation. Moderate facet hypertrophy. Mild spinal
stenosis. Foramina remain patent.

L3-4: Chronic intervertebral disc space narrowing with diffuse disc
bulge. Moderate facet hypertrophy. Resultant mild spinal stenosis.
Foramina remain patent.

L4-5: Chronic intervertebral disc space narrowing with diffuse disc
bulge. Possible early changes of acute discitis at the posterior
L4-5 disc. Abnormal enhancement within the ventral epidural space
concerning for epidural phlegmon. Possible small 5 mm abscess within
the right ventral epidural space as above. Moderate facet
hypertrophy. Resultant mild canal with moderate bilateral lateral
recess narrowing. Foramina remain patent.

L5-S1: Chronic bilateral pars defects at L5 with 9 mm
spondylolisthesis. Associated broad posterior pseudo disc
bulge/uncovering. Changes related to osteomyelitis discitis.
Epidural phlegmon and enhancement possible small 9 mm abscess within
the left lateral recess as above. Moderate to advanced facet
degeneration. Mild canal with bilateral lateral recess narrowing.
Moderate bilateral L5 foraminal stenosis.

MRI SACRUM FINDINGS:

Visualized bone marrow signal intensity diffusely heterogeneous but
within normal limits. No discrete or worrisome osseous lesions. No
acute or chronic fracture. SI joints approximated and symmetric. No
evidence for septic arthritis about either SI joint about the
visualized hips or pubis symphysis.

Changes related to osteomyelitis discitis at L5-S1 as above. Mild
paraspinous edema and enhancement about the L5-S1 interspace
extending inferiorly towards the presacral space. Associated fairly
symmetric edema and enhancement within the piriformis musculature
bilaterally. Asymmetric edema and enhancement within the right
gluteus medius musculature. Mild edema and enhancement within the
posterior paraspinous musculature as well. No discrete abscess or
drainable fluid collection.

No free fluid within the pelvis. Bladder within normal limits.
Uterus unremarkable. No made of a 12 mm simple left adnexal cyst.
Large volume retained stool seen impacted within the rectal vault.
Visualized bowels otherwise unremarkable.
IMPRESSION: MR LUMBAR SPINE IMPRESSION:

1. Findings consistent with acute osteomyelitis discitis involving
the L5-S1 interspace. Associated epidural phlegmon extending from
L3-4 through L5-S1 with a few possible tiny micro epidural abscesses
as above. No drainable paraspinous collection identified.
2. Subtle changes of possible early discitis involving the posterior
L4-5 interspace.
3. Chronic bilateral pars defects at L5 with associated 9 mm
spondylolisthesis and moderate bilateral L5 foraminal narrowing.
4. Additional moderate multilevel degenerative spondylolysis as
detailed above. Please see above report for a full description of
these findings.

MR SACRUM IMPRESSION:

1. Mild reactive edema within the presacral space and adjacent
musculature related to osteomyelitis discitis at L5-S1 as above. No
other evidence for acute infection about the sacrum or within the
visualized pelvis.
2. Large volume retained stool impacted within the rectal vault,
suggesting constipation.
3. 12 mm simple left adnexal cyst, indeterminate. Further evaluation
with nonemergent pelvic ultrasound recommended for further
characterization.

## 2018-08-22 IMAGING — XA IR DISC ASPIRATION W/IMAG GUIDE
1 series · 6 of 6 positions shown · non-contrast
Comparison: none

INDICATION: L5-S1 discitis

[Series 300: spine · 6 of 6 slices shown]
[im 1/6]
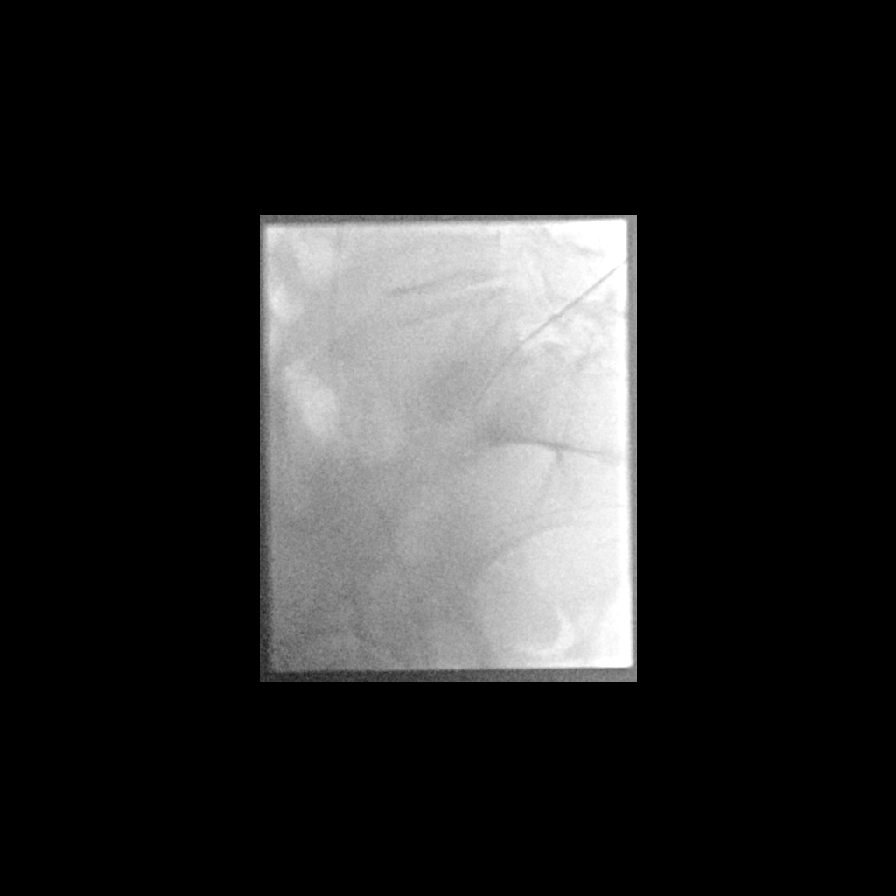
[im 2/6]
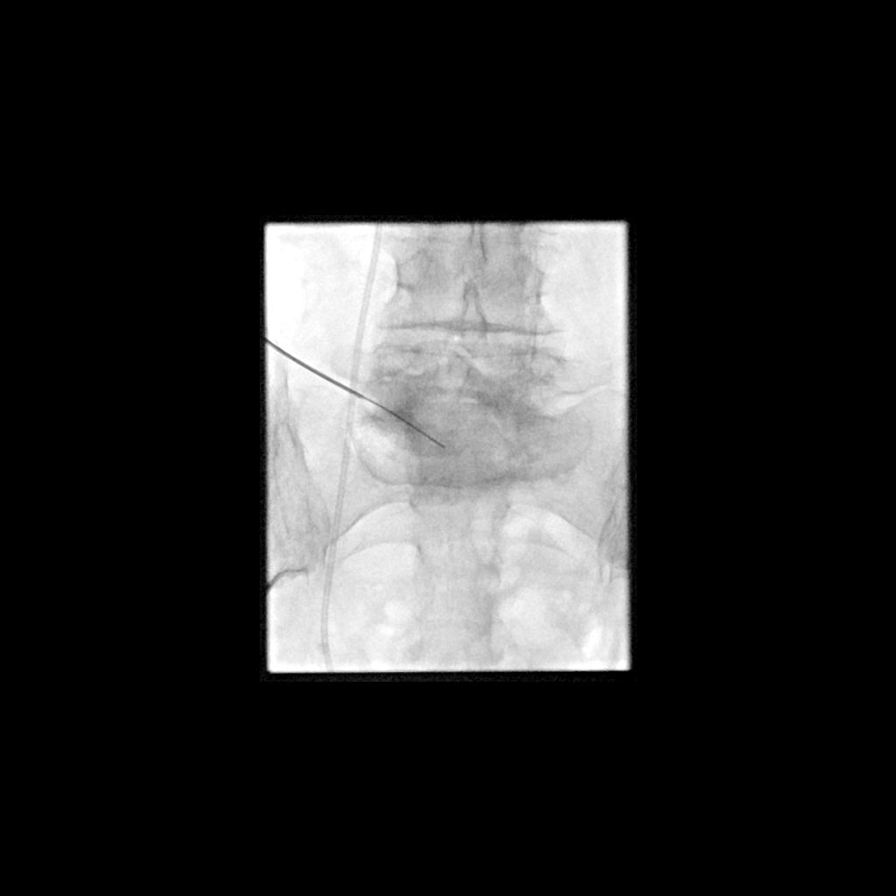
[im 3/6]
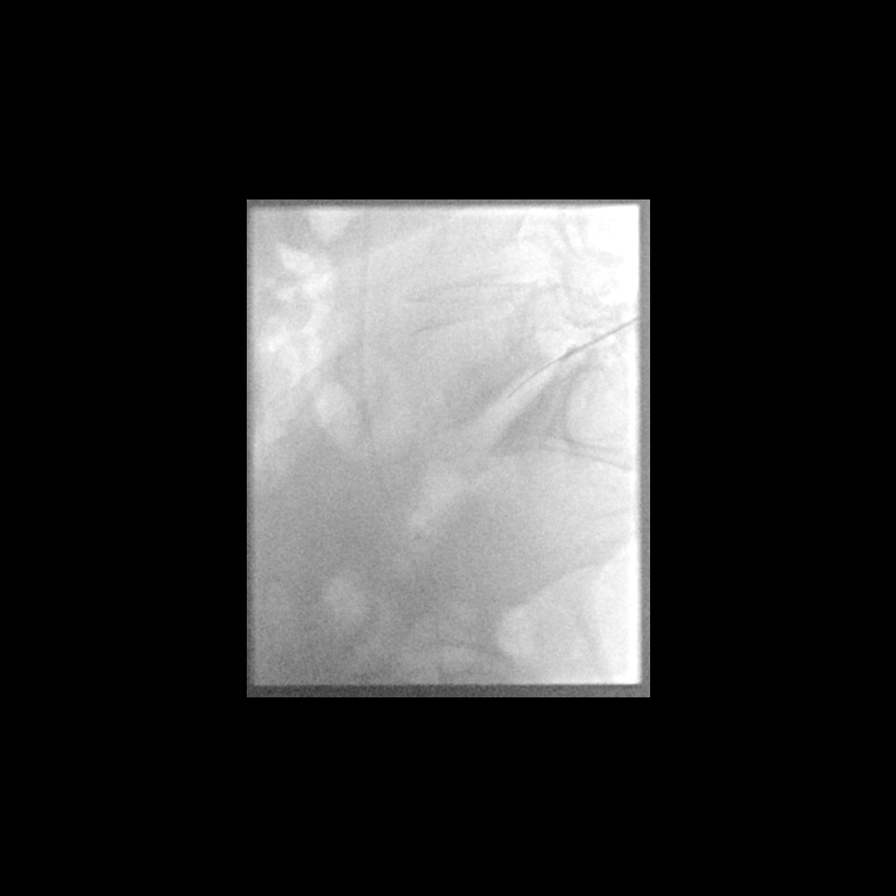
[im 4/6]
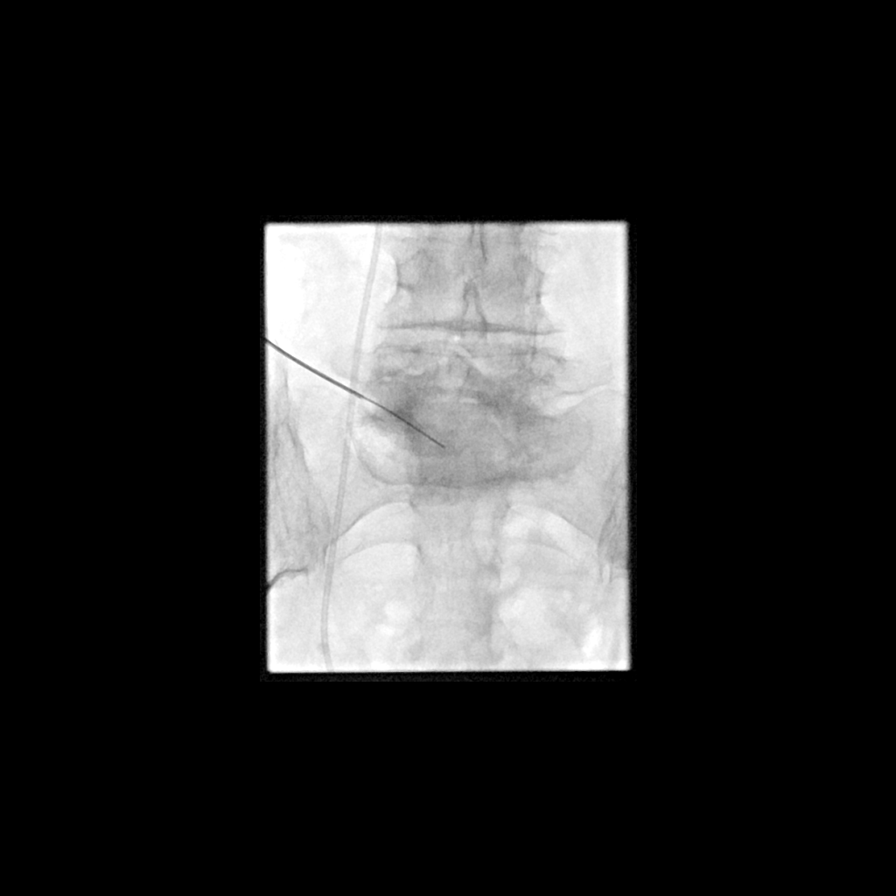
[im 5/6]
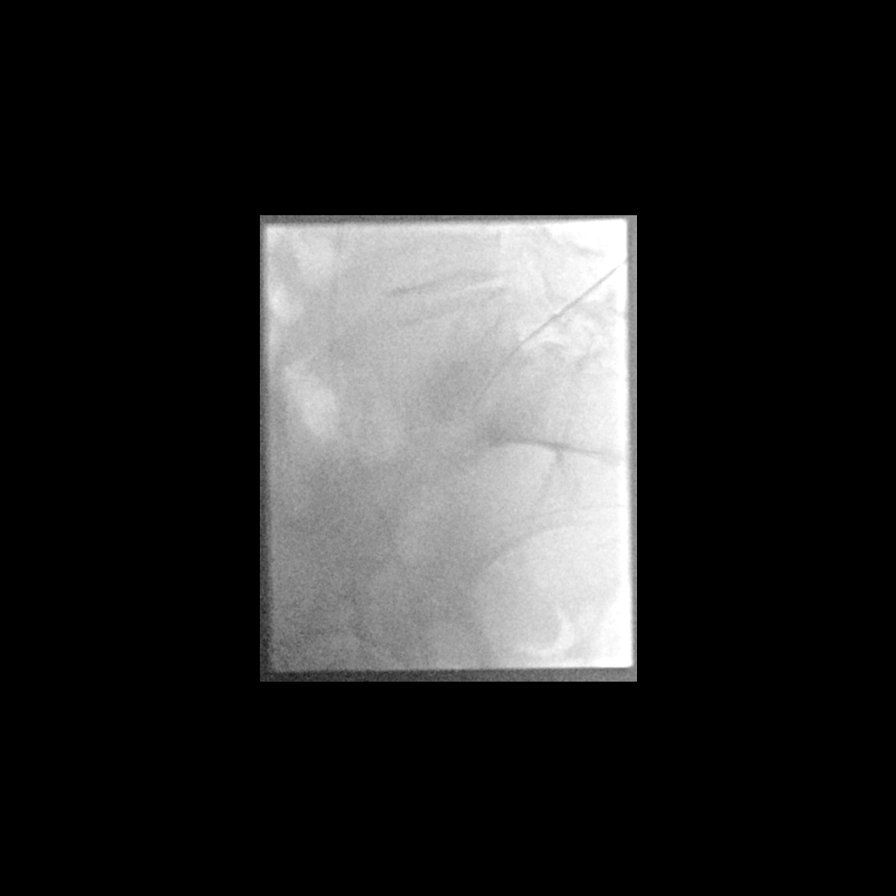
[im 6/6]
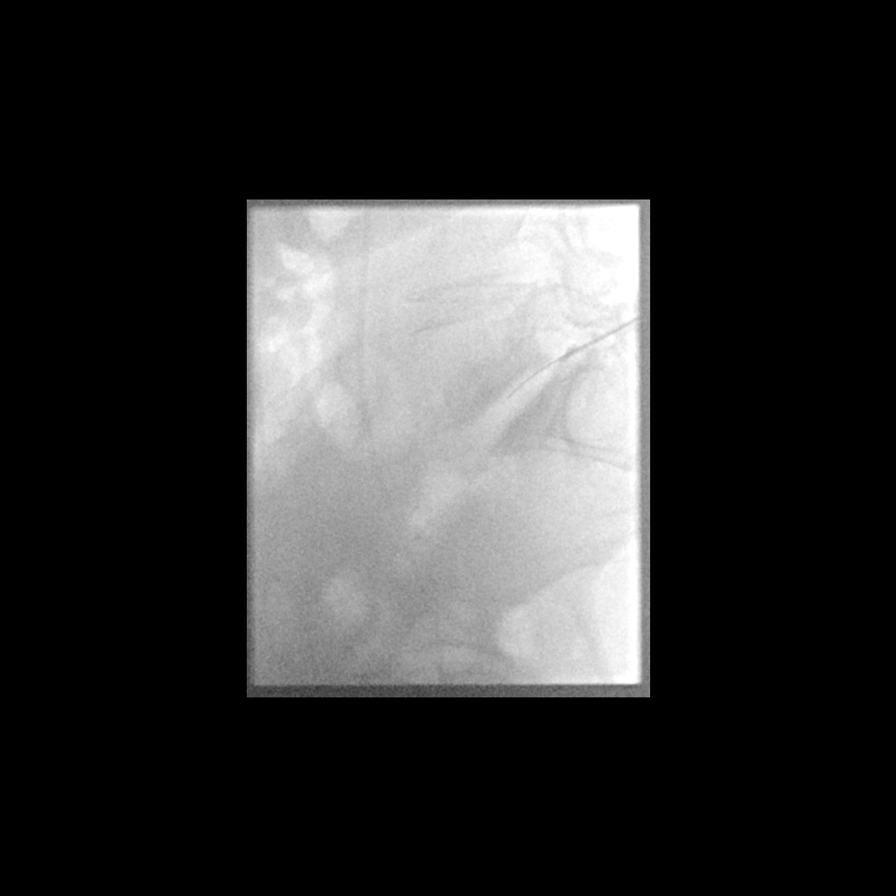

[6 of 6 positions shown; findings below may reference images not displayed]

EXAM:
DISC ASPIRATION

MEDICATIONS:
The patient is currently admitted to the hospital and receiving
intravenous antibiotics. The antibiotics were administered within an
appropriate time frame prior to the initiation of the procedure.

ANESTHESIA/SEDATION:
Fentanyl 50 mcg IV; Versed 1.5 mg IV

Moderate Sedation Time:  16 minutes

The patient was continuously monitored during the procedure by the
interventional radiology nurse under my direct supervision.

COMPLICATIONS:
None immediate.

PROCEDURE:
Informed written consent was obtained from the patient after a
thorough discussion of the procedural risks, benefits and
alternatives. All questions were addressed. Maximal Sterile Barrier
Technique was utilized including caps, mask, sterile gowns, sterile
gloves, sterile drape, hand hygiene and skin antiseptic. A timeout
was performed prior to the initiation of the procedure.

The back was prepped and draped in a sterile fashion. 1% lidocaine
was utilized for local anesthesia. Under fluoroscopic guidance, a 10
cm 18 gauge needle was advanced to the left lateral edge of the
L5-S1 disc utilizing a posterolateral approach. A curved 22 gauge 20
cm Chiba needle was then advanced through the 18 gauge needle into
the L5-S1 disc. AP and lateral imaging documents needle tip position
in the central L5-S1 disc region. Aspiration yielded 1 cc cloudy
serosanguineous fluid.
FINDINGS: Imaging documents needle placement in the central location of the
L5-S1 disc.
IMPRESSION: Successful L5-S1 disc aspiration yielding 1 cc cloudy
serosanguineous fluid. This was sent for culture.

## 2018-09-16 ENCOUNTER — Other Ambulatory Visit: Payer: Self-pay | Admitting: Obstetrics and Gynecology

## 2018-09-16 ENCOUNTER — Other Ambulatory Visit: Payer: Self-pay | Admitting: Family Medicine

## 2018-09-16 DIAGNOSIS — Z1231 Encounter for screening mammogram for malignant neoplasm of breast: Secondary | ICD-10-CM

## 2018-09-20 ENCOUNTER — Other Ambulatory Visit: Payer: Self-pay | Admitting: Family Medicine

## 2018-09-20 DIAGNOSIS — N6320 Unspecified lump in the left breast, unspecified quadrant: Secondary | ICD-10-CM

## 2018-10-03 ENCOUNTER — Other Ambulatory Visit: Payer: Self-pay

## 2018-10-03 ENCOUNTER — Other Ambulatory Visit: Payer: Self-pay | Admitting: Family Medicine

## 2018-10-03 ENCOUNTER — Ambulatory Visit
Admission: RE | Admit: 2018-10-03 | Discharge: 2018-10-03 | Disposition: A | Discharge status: Home / Self Care | Payer: Medicare Other | Source: Ambulatory Visit | Attending: Family Medicine | Admitting: Family Medicine

## 2018-10-03 DIAGNOSIS — N6489 Other specified disorders of breast: Secondary | ICD-10-CM | POA: Diagnosis not present

## 2018-10-03 DIAGNOSIS — R928 Other abnormal and inconclusive findings on diagnostic imaging of breast: Secondary | ICD-10-CM

## 2018-10-03 DIAGNOSIS — Z853 Personal history of malignant neoplasm of breast: Secondary | ICD-10-CM | POA: Diagnosis not present

## 2018-10-03 DIAGNOSIS — N6320 Unspecified lump in the left breast, unspecified quadrant: Secondary | ICD-10-CM

## 2018-10-03 IMAGING — DX DG ABDOMEN 1V
1 series · 1 of 1 positions shown · non-contrast
Comparison: 12/07/2017

CLINICAL DATA: Pre-op for left sided stone.

EXAM:
ABDOMEN - 1 VIEW

[abdomen kub]
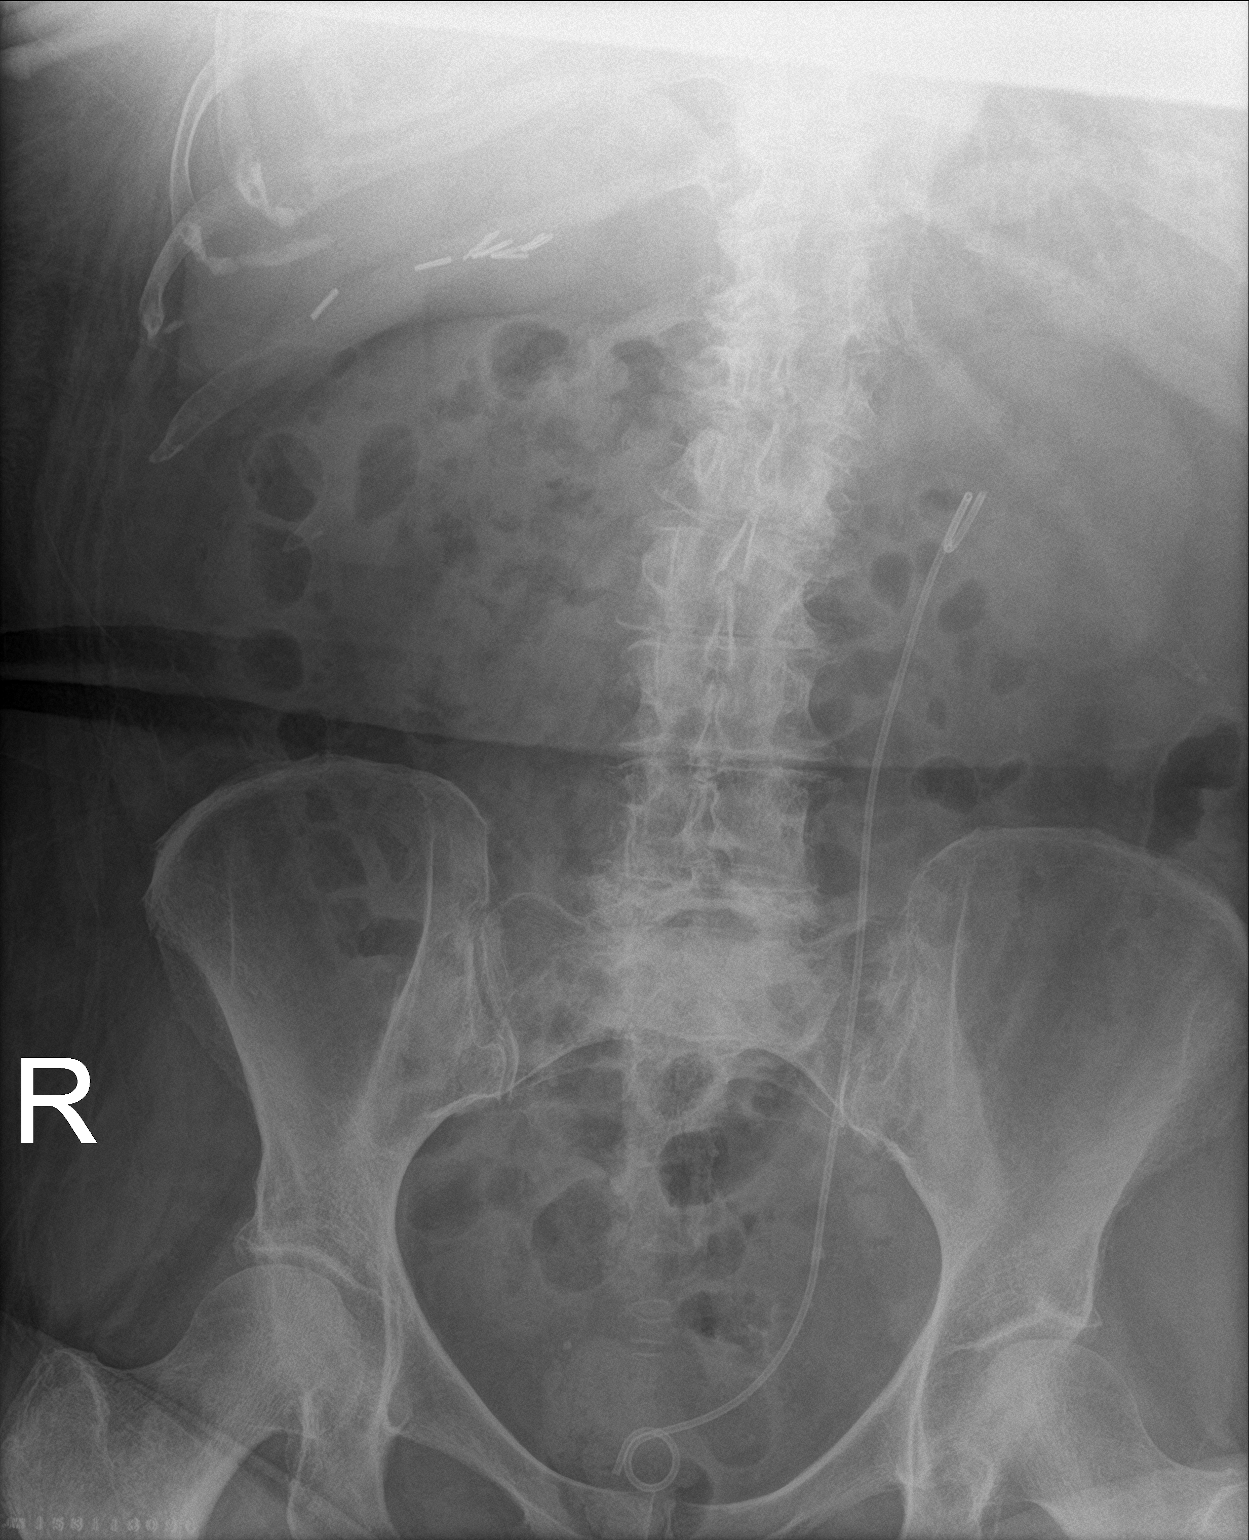

[1 of 1 positions shown; findings below may reference images not displayed]

FINDINGS: Left ureteral stent stable in position. Bilateral pelvic
calcifications. A 4 mm calcification projects over the stent just
above the level of the femoral head.

Normal bowel gas pattern. Cholecystectomy clips. Spondylitic changes
in the lumbar spine.
IMPRESSION: 1. Stable left ureteral stent with possible 4 mm distal ureteral
calculus.

## 2018-10-18 ENCOUNTER — Other Ambulatory Visit: Payer: Self-pay | Admitting: Family Medicine

## 2018-10-18 DIAGNOSIS — N6320 Unspecified lump in the left breast, unspecified quadrant: Secondary | ICD-10-CM

## 2018-11-21 ENCOUNTER — Other Ambulatory Visit: Payer: Self-pay | Admitting: Family Medicine

## 2018-11-22 ENCOUNTER — Other Ambulatory Visit: Payer: Self-pay | Admitting: Family Medicine

## 2018-11-24 ENCOUNTER — Other Ambulatory Visit: Payer: Self-pay

## 2018-11-24 ENCOUNTER — Ambulatory Visit
Admission: RE | Admit: 2018-11-24 | Discharge: 2018-11-24 | Disposition: A | Discharge status: Home / Self Care | Payer: Medicare Other | Source: Ambulatory Visit | Attending: Family Medicine | Admitting: Family Medicine

## 2018-11-24 DIAGNOSIS — N6320 Unspecified lump in the left breast, unspecified quadrant: Secondary | ICD-10-CM

## 2018-11-24 DIAGNOSIS — I517 Cardiomegaly: Secondary | ICD-10-CM | POA: Diagnosis not present

## 2018-11-24 DIAGNOSIS — R928 Other abnormal and inconclusive findings on diagnostic imaging of breast: Secondary | ICD-10-CM | POA: Diagnosis not present

## 2018-11-24 DIAGNOSIS — Z853 Personal history of malignant neoplasm of breast: Secondary | ICD-10-CM | POA: Diagnosis not present

## 2018-11-24 MED ORDER — GADOBUTROL 1 MMOL/ML IV SOLN
10.0000 mL | Freq: Once | INTRAVENOUS | Status: AC | PRN
  Administered 2018-11-24: 14:00:00 10 mL via INTRAVENOUS

## 2018-12-16 DIAGNOSIS — M1711 Unilateral primary osteoarthritis, right knee: Secondary | ICD-10-CM | POA: Diagnosis not present

## 2018-12-28 ENCOUNTER — Ambulatory Visit (INDEPENDENT_AMBULATORY_CARE_PROVIDER_SITE_OTHER): Payer: Medicare Other | Admitting: Cardiovascular Disease

## 2018-12-28 ENCOUNTER — Other Ambulatory Visit: Payer: Self-pay

## 2018-12-28 ENCOUNTER — Encounter: Payer: Self-pay | Admitting: Cardiovascular Disease

## 2018-12-28 VITALS — BP 158/97 | HR 71 | Temp 97.0°F | Ht 66.5 in | Wt 205.0 lb

## 2018-12-28 DIAGNOSIS — R0609 Other forms of dyspnea: Secondary | ICD-10-CM

## 2018-12-28 DIAGNOSIS — I10 Essential (primary) hypertension: Secondary | ICD-10-CM

## 2018-12-28 DIAGNOSIS — M25473 Effusion, unspecified ankle: Secondary | ICD-10-CM

## 2018-12-28 DIAGNOSIS — E785 Hyperlipidemia, unspecified: Secondary | ICD-10-CM

## 2018-12-28 DIAGNOSIS — I447 Left bundle-branch block, unspecified: Secondary | ICD-10-CM | POA: Diagnosis not present

## 2018-12-28 MED ORDER — HYDROCHLOROTHIAZIDE 12.5 MG PO CAPS: 12.5000 mg | ORAL_CAPSULE | Freq: Every day | ORAL | 12 refills | Status: DC | PRN

## 2018-12-28 NOTE — Patient Instructions (Signed)
Medication Instructions:  START HYDROCHLOROTHIAZIDE 12.5MG  AS NEEDED FOR SWELLING  If you need a refill on your cardiac medications before your next appointment, please call your pharmacy.  Follow-Up: You will need a follow up appointment in 12 months.  Please call our office 3 months in advance, JUNE 2021 to schedule this, September 2021 appointment.  You may see Shelva Majestic, MD or one of the following Advanced Practice Providers on your designated Care Team:  Almyra Deforest, PA-C  Fabian Sharp, PA-C     At [facility], you and your health needs are our priority.  As part of our continuing mission to provide you with exceptional heart care, we have created designated Provider Care Teams.  These Care Teams include your primary Cardiologist (physician) and Advanced Practice Providers (APPs -  Physician Assistants and Nurse Practitioners) who all work together to provide you with the care you need, when you need it.  Thank you for choosing [facility]!!

## 2018-12-28 NOTE — Progress Notes (Signed)
Cardiology Office Note    Date:  12/28/2018   ID:  Rebecca Porter, DOB 07-19-1940, MRN [id number]  PCP:  London Pepper, MD  Cardiologist:  Shelva Majestic, MD   F/U cardiololgy evaluation   History of Present Illness:  Rebecca Porter is a 78 y.o. female who presents to the office today for a 1-monthfollow-up cardiology evaluation.  Rebecca Porter a history of a left kidney stone leading to hospitalization in July 2019 with urosepsis secondary to E. coli sensitive to ertapenem.  She subsequently developed low back pain and was felt to have L4-L5 epidural abscess and discitis [facility]  She was evaluated by neurology and had a PICC line inserted for 6 weeks of antibiotic therapy.  She has noticed mild shortness of breath and progressive exertional dyspnea but denies any definitive episodes of chest pain or palpitations.  She is in need to undergo urologic surgery and audiology clearance was recommended prior to surgery due to new left bundle branch block.  Of note, the patient states that she was evaluated July and ECG at that time was done.  I was able to obtain the ECG report but not see the tracing and reportedly this was interpreted as sinus tachycardia with anteroseptal infarct, age undetermined, ST abnormality with possible lateral subendocardial injury and was felt to be abnormal without prior ECG available for comparison.  When I saw her for initial cardiology evaluation, she denied any episodes of chest pressure prior MI but admitted to mild progressive shortness of breath with exertion and had noticed some leg swelling.  Preoperative echo Doppler study and nuclear stress testing.  Her nuclear stress test revealed an EF of 46%.  There were no ECG changes.  There was a medium defect of moderate severity in the anteroseptal septal wall and it was felt most likely this was due to left bundle branch block attenuation artifact but anteroseptal  ischemia could not be completely excluded.  An echo Doppler study revealed an ejection fraction at 55% without wall motion abnormalities.  She had mild PA pressure elevation at 41 mm.  She was given clearance to undergo her urologic surgery which she was able to tolerate well without cardiovascular compromise.  She states she has lost 34 pounds over 4 months.  She did not tolerate lisinopril due to cough.  He denies recent chest pain or shortness of breath.  I last saw her in November 2019.  At that time her blood pressure was elevated and her resting pulse was 90.  I added Toprol-XL 25 mg to her medical regimen.  Over the past year, she states her blood pressure at home typically has been running in the 120s to 130s.  She has noticed some intermittent ankle swelling.  She had undergone laboratory by her primary physician Dr. ALondon Pepperin February 2020.  Her total cholesterol was 222 and LDL cholesterol 130.  She was not started on any lipid-lowering therapy.  She has been on metoprolol 25 mg daily.  She presents now for evaluation.  Past Medical History:  Diagnosis Date  . Cancer ([facility]    breast cancer 2002   . Dyspnea    with exertion   . GERD (gastroesophageal reflux disease)   . History of kidney stones   . Personal history of radiation therapy 2002  . [place]is ([place]    due to kidney stone blocking the flow per pt. family  [facility]  Past Surgical History:  Procedure Laterality Date  . BREAST BIOPSY Left    malignant stereo   . BREAST LUMPECTOMY Left 2002  . CHOLECYSTECTOMY     1997  . CYSTOSCOPY     Left stone removal 12-31-17  . CYSTOSCOPY/URETEROSCOPY/HOLMIUM LASER/STENT PLACEMENT Left 01/10/2018   Procedure: CYSTOSCOPY, URETEROSCOPY,RETROGRADE PYLEGRAM,BASKET STONE EXTRACTION, AND  STENT REMOVAL;  Surgeon: Kathie Rhodes, MD;  Location: [facility];  Service: Urology;  Laterality: Left;  . IR LUMBAR DISC ASPIRATION W/IMG GUIDE  11/29/2017   abscess on spine  between L5 and S1    On IV Invanz   . RENAL ARTERY STENT     left November 07 2017    Current Medications: Outpatient Medications Prior to Visit  Medication Sig Dispense Refill  . metoprolol succinate (TOPROL XL) 25 MG 24 hr tablet Take 1 tablet (25 mg total) by mouth daily. 90 tablet 3  . oxybutynin (DITROPAN XL) 15 MG 24 hr tablet Take 15 mg by mouth daily.    . traMADol (ULTRAM) 50 MG tablet Take 50 mg by mouth daily as needed.    . ondansetron (ZOFRAN-ODT) 4 MG disintegrating tablet Take 4 mg by mouth 3 (three) times daily as needed.  0   No facility-administered medications prior to visit.      Allergies:   Ciprofloxacin hcl, Penicillins, Macrobid [nitrofurantoin macrocrystal], and Protonix [pantoprazole sodium]   Social History   Socioeconomic History  . Marital status: Widowed    Spouse name: Not on file  . Number of children: Not on file  . Years of education: Not on file  . Highest education level: Not on file  Occupational History  . Not on file  Social Needs  . Financial resource strain: Not on file  . Food insecurity    Worry: Not on file    Inability: Not on file  . Transportation needs    Medical: Not on file    Non-medical: Not on file  Tobacco Use  . Smoking status: Never Smoker  . Smokeless tobacco: Never Used  Substance and Sexual Activity  . Alcohol use: Never    Frequency: Never  . Drug use: Never  . Sexual activity: Not Currently  Lifestyle  . Physical activity    Days per week: Not on file    Minutes per session: Not on file  . Stress: Not on file  Relationships  . Social Herbalist on phone: Not on file    Gets together: Not on file    Attends religious service: Not on file    Active member of club or organization: Not on file    Attends meetings of clubs or organizations: Not on file    Relationship status: Not on file  Other Topics Concern  . Not on file  Social History Narrative  . Not on file    Social history is notable that she is widowed  for 10 years.  She has 2 children, 4 grandchildren, and 2 great-grandchildren.  She is self-employed and is in [employer].  There is no tobacco history.  She does not exercise.  There is no alcohol use.  Family History:  The patient's family history is not on file.   Family history is notable for mother dying at age 4 with colon cancer.  Her father died at age 67 but has suffered a massive heart attack in 9.    ROS General: Negative; No fevers, chills, or night sweats;  HEENT: Negative;  No changes in vision or hearing, sinus congestion, difficulty swallowing Pulmonary: Negative; No cough, wheezing, shortness of breath, hemoptysis Cardiovascular: Positive for exertional dyspnea.  No chest pain or palpitations; no awareness of prior MI; positive for leg swelling GI: Negative; No nausea, vomiting, diarrhea, or abdominal pain GU: Positive for recent urosepsis and left kidney stone with subsequent stenting Musculoskeletal: Right knee discomfort which has limited her activity. Hematologic/Oncology: Negative; no easy bruising, bleeding Endocrine: Negative; no heat/cold intolerance; no diabetes Neuro: Sciatica symptoms from the spine abscess Skin: Negative; No rashes or skin lesions Psychiatric: Negative; No behavioral problems, depression Sleep: Negative; No snoring, daytime sleepiness, hypersomnolence, bruxism, restless legs, hypnogognic hallucinations, no cataplexy Other comprehensive 14 point system review is negative.   PHYSICAL EXAM:   VS:  BP (!) 158/97   Pulse 71   Temp (!) 97 F (36.1 C)   Ht 5' 6.5" (1.689 m)   Wt 205 lb (93 kg)   SpO2 97%   BMI 32.59 kg/m     Repeat blood pressure by me was 148/88 and repeat again was 144/86.  Wt Readings from Last 3 Encounters:  12/28/18 205 lb (93 kg)  02/11/18 204 lb 6.4 oz (92.7 kg)  01/11/18 218 lb (98.9 kg)    General: Alert, oriented, no distress.  Skin: normal turgor, no rashes, warm and dry HEENT: Normocephalic,  atraumatic. Pupils equal round and reactive to light; sclera anicteric; extraocular muscles intact;  Nose without nasal septal hypertrophy Mouth/Parynx benign; Mallinpatti scale 3 Neck: No JVD, no carotid bruits; normal carotid upstroke Lungs: clear to ausculatation and percussion; no wheezing or rales Chest wall: without tenderness to palpitation Heart: PMI not displaced, RRR, s1 s2 normal, 1/6 systolic murmur, no diastolic murmur, no rubs, gallops, thrills, or heaves Abdomen: soft, nontender; no hepatosplenomehaly, BS+; abdominal aorta nontender and not dilated by palpation. Back: no CVA tenderness Pulses 2+ Musculoskeletal: full range of motion, normal strength, no joint deformities Extremities: Trace ankle edema; no clubbing cyanosis, Homan's sign negative  Neurologic: grossly nonfocal; Cranial nerves grossly wnl Psychologic: Normal mood and affect   Studies/Labs Reviewed:   EKG:  EKG is ordered today.  ECG (independently read by me): Normal sinus rhythm at 60 bpm, left bundle branch block, QTc interval 456 Rebecca  February 11, 2018 ECG (independently read by me): Normal sinus rhythm at 90 bpm.  Left bundle branch block with repolarization changes.  QTc interval 481 Rebecca.  December 28, 2017 ECG (independently read by me): Normal sinus rhythm at 88 bpm.  Left bundle branch block with repolarization changes.  Poor R wave progression V1 through V6.  December 22, 2017 ECG (independently reviewed by me): Normal sinus rhythm.  Left bundle branch block with repolarization changes.   Recent Labs: BMP Latest Ref Rng & Units 12/22/2017 11/27/2017 11/26/2017  Glucose 70 - 99 mg/dL 89 91 100(H)  BUN 8 - 23 mg/dL 22 24(H) 27(H)  Creatinine 0.44 - 1.00 mg/dL 0.85 0.77 0.77  Sodium 135 - 145 mmol/L 143 138 139  Potassium 3.5 - 5.1 mmol/L 3.7 4.1 4.3  Chloride 98 - 111 mmol/L 108 108 108  CO2 22 - 32 mmol/L _0 Calcium 8.9 - 10.3 mg/dL 9.6 9.0 9.1     Hepatic Function Latest Ref Rng & Units  12/30/2017 11/26/2017  Total Protein 6.0 - 8.5 g/dL 6.4 6.0(L)  Albumin 3.5 - 4.8 g/dL 3.5 2.4(L)  AST 0 - 40 IU/L 20 16  ALT 0 - 32 IU/L 15 21  Alk  Phosphatase 39 - 117 IU/L 98 145(H)  Total Bilirubin 0.0 - 1.2 mg/dL 0.4 0.6  Bilirubin, Direct 0.00 - 0.40 mg/dL 0.13 -    CBC Latest Ref Rng & Units 12/22/2017 11/27/2017 11/26/2017  WBC 4.0 - 10.5 K/uL 6.7 9.5 11.5(H)  Hemoglobin 12.0 - 15.0 g/dL 11.1(L) 9.5(L) 9.7(L)  Hematocrit 36.0 - 46.0 % 34.3(L) 29.4(L) 30.2(L)  Platelets 150 - 400 K/uL 222 259 237   Lab Results  Component Value Date   MCV 93.5 12/22/2017   MCV 93.9 11/27/2017   MCV 94.4 11/26/2017   Lab Results  Component Value Date   TSH 2.470 12/30/2017   No results found for: HGBA1C   BNP No results found for: BNP  ProBNP No results found for: PROBNP   Lipid Panel     Component Value Date/Time   CHOL 186 12/30/2017 1022   TRIG 71 12/30/2017 1022   HDL 65 12/30/2017 1022   CHOLHDL 2.9 12/30/2017 1022   LDLCALC 107 (H) 12/30/2017 1022     RADIOLOGY: No results found.   Additional studies/ records that were reviewed today include:  Reviewed the patient's hospital records and obtained interpretation analysis of a prior ECG.  Recent laboratory was reviewed.  ASSESSMENT:    1. Hypertension, unspecified type   2. Essential hypertension   3. DOE (dyspnea on exertion)   4. LBBB (left bundle branch block)   5. Hyperlipidemia, unspecified hyperlipidemia type   6. Ankle edema      PLAN:  Rebecca Porter is a 78 year old female who developed urosepsis from a left kidney stone which was culture positive for extended spectrum beta-lactamase (ESBL) E. coli requiring PICC line insertion and 6 weeks of antibiotic therapy.  She has multiple allergies and required ertapenem antibiotic therapy.  She denied any prior cardiac history.  Her ECG reported in July [facility] raise the possibility of age-indeterminate anteroseptal MI with possible lateral  subendocardial injury.  Her ECG when I initially evaluated her revealed sinus rhythm with poor R wave progression and left bundle branch block with repolarization changes.  She had experienced exertional dyspnea and increased shortness of breath as well as leg edema.  She underwent a nuclear perfusion study and echo Doppler evaluation.  On echo, EF was normal without significant valvular pathology there was mild grade 1 diastolic dysfunction.  She had mild TR and mild increased PA pressure.  Her nuclear perfusion study was low risk but showed a defect in the anteroseptal wall most likely due to her left bundle branch block.  She continues to feel well and is without chest pain or shortness of breath.  When I last saw her, her blood pressure was elevated and her resting pulse was in the 90s and I added Toprol-XL 25 mg to her medical regimen.  Her blood pressure today remains elevated with a goal for target blood pressure less than 130/80 and ideal blood pressure 120/80 or below.  With her history of some mild intermittent ankle swelling, I am giving her a prescription for HCTZ 12.5 mg to take on an as-needed basis and if her blood pressure at home remains in the 130 or above range.  Her primary physician will be checking laboratory.  I discussed with her that if on follow-up laboratory her lipid studies remain elevated, she may be a candidate for institution of low-dose lipid-lowering therapy.  I will see her in 1 year for reevaluation.   Medication Adjustments/Labs and Tests Ordered: Current medicines are reviewed at length  with the patient today.  Concerns regarding medicines are outlined above.  Medication changes, Labs and Tests ordered today are listed in the Patient Instructions below. Patient Instructions  Medication Instructions:  START HYDROCHLOROTHIAZIDE 12.5MG AS NEEDED FOR SWELLING  If you need a refill on your cardiac medications before your next appointment, please call your pharmacy.   Follow-Up: You will need a follow up appointment in 12 months.  Please call our office 3 months in advance, JUNE 2021 to schedule this, September 2021 appointment.  You may see Shelva Majestic, MD or one of the following Advanced Practice Providers on your designated Care Team:  Almyra Deforest, PA-C  Fabian Sharp, PA-C     At [facility], you and your health needs are our priority.  As part of our continuing mission to provide you with exceptional heart care, we have created designated Provider Care Teams.  These Care Teams include your primary Cardiologist (physician) and Advanced Practice Providers (APPs -  Physician Assistants and Nurse Practitioners) who all work together to provide you with the care you need, when you need it.  Thank you for choosing [facility]!!        Signed, Shelva Majestic, MD  12/28/2018 6:37 PM    [facility] [address] [place], [place], [place]  [address] Phone: [phone]

## 2019-01-17 DIAGNOSIS — H353132 Nonexudative age-related macular degeneration, bilateral, intermediate dry stage: Secondary | ICD-10-CM | POA: Diagnosis not present

## 2019-01-17 DIAGNOSIS — Z961 Presence of intraocular lens: Secondary | ICD-10-CM | POA: Diagnosis not present

## 2019-01-23 ENCOUNTER — Encounter (INDEPENDENT_AMBULATORY_CARE_PROVIDER_SITE_OTHER): Payer: Self-pay | Admitting: Ophthalmology

## 2019-01-30 DIAGNOSIS — R42 Dizziness and giddiness: Secondary | ICD-10-CM | POA: Diagnosis not present

## 2019-01-30 DIAGNOSIS — I447 Left bundle-branch block, unspecified: Secondary | ICD-10-CM | POA: Diagnosis not present

## 2019-02-01 DIAGNOSIS — Z6834 Body mass index (BMI) 34.0-34.9, adult: Secondary | ICD-10-CM | POA: Diagnosis not present

## 2019-02-01 DIAGNOSIS — N3941 Urge incontinence: Secondary | ICD-10-CM | POA: Diagnosis not present

## 2019-02-01 DIAGNOSIS — Z01419 Encounter for gynecological examination (general) (routine) without abnormal findings: Secondary | ICD-10-CM | POA: Diagnosis not present

## 2019-02-14 ENCOUNTER — Other Ambulatory Visit: Payer: Self-pay | Admitting: Cardiovascular Disease

## 2019-04-17 ENCOUNTER — Ambulatory Visit: Payer: Medicare Other | Attending: Family Medicine

## 2019-04-17 ENCOUNTER — Other Ambulatory Visit: Payer: Self-pay

## 2019-04-17 DIAGNOSIS — M545 Low back pain, unspecified: Secondary | ICD-10-CM

## 2019-04-17 DIAGNOSIS — M6281 Muscle weakness (generalized): Secondary | ICD-10-CM | POA: Insufficient documentation

## 2019-04-17 NOTE — Therapy (Signed)
[facility] [address] [place], [place], [address] Phone: [phone]   Fax:  [phone]  Physical Therapy Evaluation  Patient Details  Name: Rebecca Porter MRN: [id number] Date of Birth: 09/12/1940 Referring Provider (PT): London Pepper, MD   Encounter Date: 04/17/2019  PT End of Session - 04/17/19 1444    Visit Number  1    Date for PT Re-Evaluation  05/29/19    Authorization Type  Medicare    PT Start Time  1416   late   PT Stop Time  1445    PT Time Calculation (min)  29 min    Activity Tolerance  Patient tolerated treatment well    Behavior During Therapy  [facility] for tasks assessed/performed       Past Medical History:  Diagnosis Date  . Cancer [facility])    breast cancer 2002   . Dyspnea    with exertion   . GERD (gastroesophageal reflux disease)   . History of kidney stones   . Personal history of radiation therapy 2002  . Sepsis ([place])    due to kidney stone blocking the flow per pt. family  10-2017  IV [facility]    Past Surgical History:  Procedure Laterality Date  . BREAST BIOPSY Left    malignant stereo   . BREAST LUMPECTOMY Left 2002  . CHOLECYSTECTOMY     1997  . CYSTOSCOPY     Left stone removal 12-31-17  . CYSTOSCOPY/URETEROSCOPY/HOLMIUM LASER/STENT PLACEMENT Left 01/10/2018   Procedure: CYSTOSCOPY, URETEROSCOPY,RETROGRADE PYLEGRAM,BASKET STONE EXTRACTION, AND  STENT REMOVAL;  Surgeon: Kathie Rhodes, MD;  Location: [facility];  Service: Urology;  Laterality: Left;  . IR LUMBAR DISC ASPIRATION W/IMG GUIDE  11/29/2017   abscess on spine  between L5 and S1   On IV Invanz   . RENAL ARTERY STENT     left November 07 2017    There were no vitals filed for this visit.   Subjective Assessment - 04/17/19 1416    Subjective  Pt presents to PT with complaints of LBP that began ~3 weeks ago without incident or injury. Pain has nearly resolved since this pain has started.  Pt has a history of chronic LBP.    Diagnostic tests  MRI 2019: lumbar spondylolisthesis, osteomyelitis, pars defect    Patient Stated Goals  reduce LBP    Currently in Pain?  Yes    Pain Score  0-No pain   shooting pain in the past week up to 8-9/10   Pain Location  Back    Pain Orientation  Right;Left;Lower    Pain Descriptors / Indicators  Shooting    Pain Type  Acute pain    Pain Onset  1 to 4 weeks ago    Pain Frequency  Intermittent    Aggravating Factors   sleep at night, sitting on the sofa, standing for housework    Pain Relieving Factors  elevating legs, change of position         [facility] PT Assessment - 04/17/19 0001      Assessment   Medical Diagnosis  back pain    Referring Provider (PT)  London Pepper, MD    Onset Date/Surgical Date  03/27/19    Prior Therapy  none      Precautions   Precautions  Other (comment)   history of breast cancer     Restrictions   Weight Bearing Restrictions  No      Balance Screen  Has the patient fallen in the past 6 months  No    Has the patient had a decrease in activity level because of a fear of falling?   No    Is the patient reluctant to leave their home because of a fear of falling?   No      Home Film/video editor residence    Living Arrangements  Alone      Prior Function   Level of [place]  Retired      Associate Professor   Overall Cognitive Status  Within Functional Limits for tasks assessed      Observation/Other Assessments   Focus on Therapeutic Outcomes (FOTO)   44% limitation      Posture/Postural Control   Posture/Postural Control  Postural limitations    Postural Limitations  Rounded Shoulders;Forward head;Flexed trunk      ROM / Strength   AROM / PROM / Strength  AROM;PROM;Strength      AROM   Overall AROM   Deficits    Overall AROM Comments  Lumbar A/ROM into flexion was from the hips with very little lumbar A/ROM.  Sidebending limited by 50% bilaterally.  No pain.        PROM    Overall PROM   Within functional limits for tasks performed    Overall PROM Comments  hip flexibility is WFLs without pain.        Strength   Overall Strength  Deficits    Strength Assessment Site  Knee;Hip    Right/Left Hip  Left;Right    Right Hip Flexion  4-/5    Right Hip ABduction  4-/5    Left Hip Flexion  4-/5    Left Hip ABduction  4-/5    Right/Left Knee  Right;Left    Right Knee Flexion  4/5    Right Knee Extension  4/5    Left Knee Flexion  4/5    Left Knee Extension  4/5      Palpation   Palpation comment  palpable tenderness at sacrum      Transfers   Transfers  Independent with all Transfers      Ambulation/Gait   Ambulation/Gait  Yes    Gait Pattern  Step-through pattern                Objective measurements completed on examination: See above findings.                PT Short Term Goals - 04/17/19 1423      PT SHORT TERM GOAL #1   Title  be independent in initial HEP    Time  3    Period  Weeks    Status  New    Target Date  05/08/19      PT SHORT TERM GOAL #2   Title  report no sleep interruptions due to LBP    Time  3    Status  New    Target Date  05/08/19        PT Long Term Goals - 04/17/19 1445      PT LONG TERM GOAL #1   Title  be independent in advanced HEP    Time  6    Period  Weeks    Status  New    Target Date  05/29/19      PT LONG TERM GOAL #2   Title  reduce FOTO to < or =  to 38% limitation    Time  6    Period  Weeks    Status  New    Target Date  05/29/19      PT LONG TERM GOAL #3   Title  verbalize and demonstrate body mechanics modifications for lumbar protection with home tasks    Time  6    Period  Weeks    Target Date  05/29/19      PT LONG TERM GOAL #4   Title  improve core and LE stregnth to perform cooking tasks with 50% less fatigue    Time  6    Status  New    Target Date  05/29/19             Plan - 04/17/19 1621    Clinical Impression Statement  Pt presents to Pt  with bil LBP that began 3 weeks ago without incident or injury.  Pt reports that pain has nearly resolved since the pain began and is only having brief episodes of shooting pain at night with sleep.  Pt reports that her overall mobility is limited due to chronic LBP and Rt knee pain.  She experiences fatigue and pain in her lumbar and LE musculature with long periods of standing.  Pt demonstrates limited lumbar segmental mobility and reduced LE strength bilaterally.  Pt will benefit from skilled PT to improve hip and core strength to improve functional endurance, address pain if present and improve posture/mechanics.    Personal Factors and Comorbidities  Comorbidity 2    Comorbidities  chronic LBP, breast cancer    Examination-Activity Limitations  Locomotion Level    Examination-Participation Restrictions  Meal Prep;Community Activity    Stability/Clinical Decision Making  Stable/Uncomplicated    Clinical Decision Making  Low    Rehab Potential  Good    PT Frequency  2x / week    PT Duration  6 weeks    PT Treatment/Interventions  ADLs/Self Care Home Management;Cryotherapy;Electrical Stimulation;Moist Heat;Traction;Neuromuscular re-education;Balance training;Therapeutic exercise;Therapeutic activities;Stair training;Patient/family education;Manual techniques;Dry needling;Passive range of motion;Taping    PT Next Visit Plan  initiate HEP for hip, LE and core strength, body mechanics education, standing endurance    Consulted and Agree with Plan of Care  Patient       Patient will benefit from skilled therapeutic intervention in order to improve the following deficits and impairments:  Decreased activity tolerance, Decreased strength, Postural dysfunction, Improper body mechanics, Impaired flexibility, Pain, Decreased endurance, Increased muscle spasms, Difficulty walking, Decreased range of motion  Visit Diagnosis: Acute bilateral low back pain without sciatica - Plan: PT plan of care  cert/re-cert  Muscle weakness (generalized) - Plan: PT plan of care cert/re-cert     Problem List Patient Active Problem List   Diagnosis Date Noted  . Osteomyelitis ([place]) 11/29/2017  . 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 Acute kidney failure ([place]) 11/29/2017  . Multiple drug allergies 11/29/2017  . Depression 11/29/2017  . UTI due to extended-spectrum beta lactamase (ESBL) producing Escherichia coli 11/29/2017  . Bacteremia 11/29/2017  . 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 Obesity 11/26/2017     Sigurd Sos, PT 04/17/19 4:31 PM  [facility] [address] [place], [place], [address] Phone: [phone]   Fax:  [phone]  Name: PAIZLEE BELLINGHAUSEN MRN: [id number] Date of Birth: 1941-02-15

## 2019-04-19 ENCOUNTER — Ambulatory Visit: Payer: Medicare Other

## 2019-04-19 ENCOUNTER — Other Ambulatory Visit: Payer: Self-pay

## 2019-04-19 DIAGNOSIS — M6281 Muscle weakness (generalized): Secondary | ICD-10-CM

## 2019-04-19 DIAGNOSIS — M545 Low back pain, unspecified: Secondary | ICD-10-CM

## 2019-04-19 NOTE — Therapy (Signed)
[facility] [address] [place], [place], [address] Phone: [phone]   Fax:  [phone]  Physical Therapy Treatment  Patient Details  Name: Rebecca Porter MRN: [id number] Date of Birth: [birth year] Referring Provider (PT): London Pepper, MD   Encounter Date: 04/19/2019  PT End of Session - 04/19/19 1143    Visit Number  2    Date for PT Re-Evaluation  05/29/19    Authorization Type  Medicare    PT Start Time  1102    PT Stop Time  1147    PT Time Calculation (min)  45 min    Activity Tolerance  Patient tolerated treatment well    Behavior During Therapy  [facility] for tasks assessed/performed       Past Medical History:  Diagnosis Date  . Cancer [facility])    breast cancer 2002   . Dyspnea    with exertion   . GERD (gastroesophageal reflux disease)   . History of kidney stones   . Personal history of radiation therapy 2002  . Sepsis ([place])    due to kidney stone blocking the flow per pt. family  10-2017  IV [facility]    Past Surgical History:  Procedure Laterality Date  . BREAST BIOPSY Left    malignant stereo   . BREAST LUMPECTOMY Left 2002  . CHOLECYSTECTOMY     1997  . CYSTOSCOPY     Left stone removal 12-31-17  . CYSTOSCOPY/URETEROSCOPY/HOLMIUM LASER/STENT PLACEMENT Left 01/10/2018   Procedure: CYSTOSCOPY, URETEROSCOPY,RETROGRADE PYLEGRAM,BASKET STONE EXTRACTION, AND  STENT REMOVAL;  Surgeon: Kathie Rhodes, MD;  Location: [facility];  Service: Urology;  Laterality: Left;  . IR LUMBAR DISC ASPIRATION W/IMG GUIDE  11/29/2017   abscess on spine  between L5 and S1   On IV Invanz   . RENAL ARTERY STENT     left November 07 2017    There were no vitals filed for this visit.  Subjective Assessment - 04/19/19 1101    Subjective  I am doing well today.                       [facility] Adult PT Treatment/Exercise - 04/19/19 0001      Exercises   Exercises  Knee/Hip;Lumbar      Lumbar Exercises: Stretches    Active Hamstring Stretch  Left;Right;3 reps;20 seconds    Piriformis Stretch  Left;Right;3 reps;20 seconds      Lumbar Exercises: Aerobic   Nustep  Level 2x 8 minutes at begining of session, 5 minutes at end of session   PT present to monitor      Knee/Hip Exercises: Standing   Hip Abduction  Stengthening;Both;2 sets;10 reps    Hip Extension  Stengthening;Both;2 sets;10 reps             PT Education - 04/19/19 1128    Education Details  body mechanics, Access Code: [email or web address]    Person(s) Educated  Patient    Methods  Explanation;Demonstration;Handout    Comprehension  Verbalized understanding;Returned demonstration       PT Short Term Goals - 04/17/19 1423      PT SHORT TERM GOAL #1   Title  be independent in initial HEP    Time  3    Period  Weeks    Status  New    Target Date  05/08/19      PT SHORT TERM GOAL #2   Title  report no sleep interruptions due  to LBP    Time  3    Status  New    Target Date  05/08/19        PT Long Term Goals - 04/17/19 1445      PT LONG TERM GOAL #1   Title  be independent in advanced HEP    Time  6    Period  Weeks    Status  New    Target Date  05/29/19      PT LONG TERM GOAL #2   Title  reduce FOTO to < or = to 38% limitation    Time  6    Period  Weeks    Status  New    Target Date  05/29/19      PT LONG TERM GOAL #3   Title  verbalize and demonstrate body mechanics modifications for lumbar protection with home tasks    Time  6    Period  Weeks    Target Date  05/29/19      PT LONG TERM GOAL #4   Title  improve core and LE stregnth to perform cooking tasks with 50% less fatigue    Time  6    Status  New    Target Date  05/29/19            Plan - 04/19/19 1117    Clinical Impression Statement  Pt with first time follow-up after evaluation today.  PT initiated a HEP for LE strength and flexibility as there was not time do to this at evaluation.  Pt denies any lumbar pain at this time.  Emphasis was on  body mechanics with daily activities and core activation with standing tasks.  Pt required demo and verbal cues for technique.  Pt will continue to benefit from skilled PT to address core and hip strength, endurance for functional tasks.    PT Frequency  2x / week    PT Duration  6 weeks    PT Treatment/Interventions  ADLs/Self Care Home Management;Cryotherapy;Electrical Stimulation;Moist Heat;Traction;Neuromuscular re-education;Balance training;Therapeutic exercise;Therapeutic activities;Stair training;Patient/family education;Manual techniques;Dry needling;Passive range of motion;Taping    PT Next Visit Plan  review HEP, review body mechanics if needed.  Core and hip strength.       Patient will benefit from skilled therapeutic intervention in order to improve the following deficits and impairments:  Decreased activity tolerance, Decreased strength, Postural dysfunction, Improper body mechanics, Impaired flexibility, Pain, Decreased endurance, Increased muscle spasms, Difficulty walking, Decreased range of motion  Visit Diagnosis: Muscle weakness (generalized)  Acute bilateral low back pain without sciatica     Problem List Patient Active Problem List   Diagnosis Date Noted  . Osteomyelitis ([place]) 11/29/2017  . 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 Acute kidney failure ([place]) 11/29/2017  . Multiple drug allergies 11/29/2017  . Depression 11/29/2017  . UTI due to extended-spectrum beta lactamase (ESBL) producing Escherichia coli 11/29/2017  . Bacteremia 11/29/2017  . 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 Obesity 11/26/2017     Sigurd Sos, PT 04/19/19 11:45 AM  [facility] [address], [place], [place], [address] Phone: [phone]   Fax:  [phone]  Name: Rebecca Porter MRN: [id number] Date of Birth: 10/24/1940

## 2019-04-19 NOTE — Patient Instructions (Addendum)
   Lifting Principles  .Maintain proper posture and head alignment. .Slide object as close as possible before lifting. .Move obstacles out of the way. .Test before lifting; ask for help if too heavy. .Tighten stomach muscles without holding breath. .Use smooth movements; do not jerk. .Use legs to do the work, and pivot with feet. .Distribute the work load symmetrically and close to the center of trunk. .Push instead of pull whenever possible.   Squat down and hold basket close to stand. Use leg muscles to do the work.    Avoid twisting or bending back. Pivot around using foot movements, and bend at knees if needed when reaching for articles.        Getting Into / Out of Bed   Lower self to lie down on one side by raising legs and lowering head at the same time. Use arms to assist moving without twisting. Bend both knees to roll onto back if desired. To sit up, start from lying on side, and use same move-ments in reverse. Keep trunk aligned with legs.    Shift weight from front foot to back foot as item is lifted off shelf.    When leaning forward to pick object up from floor, extend one leg out behind. Keep back straight. Hold onto a sturdy support with other hand.      Sit upright, head facing forward. Try using a roll to support lower back. Keep shoulders relaxed, and avoid rounded back. Keep hips level with knees. Avoid crossing legs for long periods.    [facility] [address] [place], [place] [address] Phone # [phone] Fax [phone]  Access Code: [email or web address]  URL: [email or web address]  Date: 04/19/2019  Prepared by: Sigurd Sos   Exercises Standing Hip Abduction - 10 reps - 2 sets - 2x daily - 7x weekly Standing Hip Extension - 10 reps - 3 sets - 2x daily - 7x weekly Seated Hamstring Stretch - 3 reps - 20 hold - 2x daily - 7x weekly Clamshell - 10 reps - 2 sets - 2x daily - 7x weekly Seated Figure 4  Piriformis Stretch - 3 reps - 20 hold - 3x daily - 7x weekly

## 2019-04-24 ENCOUNTER — Other Ambulatory Visit: Payer: Self-pay

## 2019-04-24 ENCOUNTER — Ambulatory Visit: Payer: Medicare Other

## 2019-04-24 DIAGNOSIS — M6281 Muscle weakness (generalized): Secondary | ICD-10-CM | POA: Diagnosis not present

## 2019-04-24 DIAGNOSIS — M545 Low back pain, unspecified: Secondary | ICD-10-CM

## 2019-04-24 NOTE — Therapy (Signed)
[facility] [address], [place], [place], [address] Phone: [phone]   Fax:  [phone]  Physical Therapy Treatment  Patient Details  Name: Rebecca Porter MRN: [id number] Date of Birth: Aug 01, 1940 Referring Provider (PT): London Pepper, MD   Encounter Date: 04/24/2019  PT End of Session - 04/24/19 1140    Visit Number  3    Date for PT Re-Evaluation  05/29/19    Authorization Type  Medicare    PT Start Time  1100    PT Stop Time  1145    PT Time Calculation (min)  45 min    Activity Tolerance  Patient tolerated treatment well    Behavior During Therapy  [facility] for tasks assessed/performed       Past Medical History:  Diagnosis Date  . Cancer [facility])    breast cancer 2002   . Dyspnea    with exertion   . GERD (gastroesophageal reflux disease)   . History of kidney stones   . Personal history of radiation therapy 2002  . Sepsis ([place])    due to kidney stone blocking the flow per pt. family  10-2017  IV [facility]    Past Surgical History:  Procedure Laterality Date  . BREAST BIOPSY Left    malignant stereo   . BREAST LUMPECTOMY Left 2002  . CHOLECYSTECTOMY     1997  . CYSTOSCOPY     Left stone removal 12-31-17  . CYSTOSCOPY/URETEROSCOPY/HOLMIUM LASER/STENT PLACEMENT Left 01/10/2018   Procedure: CYSTOSCOPY, URETEROSCOPY,RETROGRADE PYLEGRAM,BASKET STONE EXTRACTION, AND  STENT REMOVAL;  Surgeon: Kathie Rhodes, MD;  Location: [facility];  Service: Urology;  Laterality: Left;  . IR LUMBAR DISC ASPIRATION W/IMG GUIDE  11/29/2017   abscess on spine  between L5 and S1   On IV Invanz   . RENAL ARTERY STENT     left November 07 2017    There were no vitals filed for this visit.  Subjective Assessment - 04/24/19 1119    Subjective  I am doing my exercises and my muscles were just a little bit sore.    Currently in Pain?  No/denies                       [facility] Adult PT Treatment/Exercise - 04/24/19 0001       Lumbar Exercises: Stretches   Active Hamstring Stretch  Left;Right;3 reps;20 seconds    Piriformis Stretch  Left;Right;3 reps;20 seconds   improved stretch on the Lt     Lumbar Exercises: Aerobic   Nustep  Level 4 x 10 minutes at begining of session, 5 minutes at end of session   PT present to monitor      Knee/Hip Exercises: Standing   Hip Abduction  Stengthening;Both;2 sets;10 reps    Abduction Limitations  2#    Hip Extension  Stengthening;Both;2 sets;10 reps;Limitations    Extension Limitations  2#    Rebounder  weight shifting 3 ways x 1 minute each      Knee/Hip Exercises: Seated   Long Arc Quad  Strengthening;Both;2 sets;10 reps    Long Arc Quad Weight  2 lbs.               PT Short Term Goals - 04/24/19 1120      PT SHORT TERM GOAL #1   Title  be independent in initial HEP    Status  Achieved      PT SHORT TERM GOAL #2  Title  report no sleep interruptions due to LBP    Baseline  it is improving- working on going to bed at the same time    Time  3    Period  Weeks    Status  On-going        PT Long Term Goals - 04/17/19 1445      PT LONG TERM GOAL #1   Title  be independent in advanced HEP    Time  6    Period  Weeks    Status  New    Target Date  05/29/19      PT LONG TERM GOAL #2   Title  reduce FOTO to < or = to 38% limitation    Time  6    Period  Weeks    Status  New    Target Date  05/29/19      PT LONG TERM GOAL #3   Title  verbalize and demonstrate body mechanics modifications for lumbar protection with home tasks    Time  6    Period  Weeks    Target Date  05/29/19      PT LONG TERM GOAL #4   Title  improve core and LE stregnth to perform cooking tasks with 50% less fatigue    Time  6    Status  New    Target Date  05/29/19            Plan - 04/24/19 1124    Clinical Impression Statement  Pt denies any LBP over the weekend.  Pt has been working to develop a more consistent sleep routine and had improved sleep  overall.  Pt performed all exercise in the clinic without difficulty or increased pain.  Pt required stand by assistance for verbal cueing for technique and safety with standing balance exercises.  Pt will continue to benefit from skilled PT for strength, flexibility and balance exercises for safety.    PT Frequency  2x / week    PT Duration  6 weeks    PT Treatment/Interventions  ADLs/Self Care Home Management;Cryotherapy;Electrical Stimulation;Moist Heat;Traction;Neuromuscular re-education;Balance training;Therapeutic exercise;Therapeutic activities;Stair training;Patient/family education;Manual techniques;Dry needling;Passive range of motion;Taping    PT Next Visit Plan  core and hip strength, balance exercises    PT Home Exercise Plan  [email or web address]    Consulted and Agree with Plan of Care  Patient       Patient will benefit from skilled therapeutic intervention in order to improve the following deficits and impairments:  Decreased activity tolerance, Decreased strength, Postural dysfunction, Improper body mechanics, Impaired flexibility, Pain, Decreased endurance, Increased muscle spasms, Difficulty walking, Decreased range of motion  Visit Diagnosis: Muscle weakness (generalized)  Acute bilateral low back pain without sciatica     Problem List Patient Active Problem List   Diagnosis Date Noted  . Osteomyelitis ([place]) 11/29/2017  . 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 Acute kidney failure ([place]) 11/29/2017  . Multiple drug allergies 11/29/2017  . Depression 11/29/2017  . UTI due to extended-spectrum beta lactamase (ESBL) producing Escherichia coli 11/29/2017  . Bacteremia 11/29/2017  . 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 Obesity 11/26/2017    Rebecca Porter, PT 04/24/19 11:42 AM  [facility] [address] [place], [place], [address] Phone: [phone]   Fax:  [phone]  Name: Rebecca Porter MRN:  [id number] Date of Birth: 07-Nov-1940

## 2019-04-26 ENCOUNTER — Ambulatory Visit: Payer: Medicare Other

## 2019-04-26 ENCOUNTER — Other Ambulatory Visit: Payer: Self-pay

## 2019-04-26 DIAGNOSIS — M545 Low back pain, unspecified: Secondary | ICD-10-CM

## 2019-04-26 DIAGNOSIS — M6281 Muscle weakness (generalized): Secondary | ICD-10-CM | POA: Diagnosis not present

## 2019-04-26 NOTE — Therapy (Signed)
[facility] [address] [place], [place], [address] Phone: [phone]   Fax:  [phone]  Physical Therapy Treatment  Patient Details  Name: Rebecca Porter MRN: [id number] Date of Birth: 24-Jun-1940 Referring Provider (PT): London Pepper, MD   Encounter Date: 04/26/2019  PT End of Session - 04/26/19 1140    Visit Number  4    Date for PT Re-Evaluation  05/29/19    Authorization Type  Medicare    PT Start Time  1055    PT Stop Time  1145    PT Time Calculation (min)  50 min    Activity Tolerance  Patient tolerated treatment well    Behavior During Therapy  [facility] for tasks assessed/performed       Past Medical History:  Diagnosis Date  . Cancer [facility])    breast cancer 2002   . Dyspnea    with exertion   . GERD (gastroesophageal reflux disease)   . History of kidney stones   . Personal history of radiation therapy 2002  . Sepsis ([place])    due to kidney stone blocking the flow per pt. family  10-2017  IV [facility]    Past Surgical History:  Procedure Laterality Date  . BREAST BIOPSY Left    malignant stereo   . BREAST LUMPECTOMY Left 2002  . CHOLECYSTECTOMY     1997  . CYSTOSCOPY     Left stone removal 12-31-17  . CYSTOSCOPY/URETEROSCOPY/HOLMIUM LASER/STENT PLACEMENT Left 01/10/2018   Procedure: CYSTOSCOPY, URETEROSCOPY,RETROGRADE PYLEGRAM,BASKET STONE EXTRACTION, AND  STENT REMOVAL;  Surgeon: Kathie Rhodes, MD;  [facility];  Service: Urology;  Laterality: Left;  . IR LUMBAR DISC ASPIRATION W/IMG GUIDE  11/29/2017   abscess on spine  between L5 and S1   On IV Invanz   . RENAL ARTERY STENT     left November 07 2017    There were no vitals filed for this visit.  Subjective Assessment - 04/26/19 1115    Subjective  I'm doing well.  No back pain after last session.    Currently in Pain?  No/denies                       [facility] Adult PT Treatment/Exercise - 04/26/19 0001      Lumbar  Exercises: Stretches   Active Hamstring Stretch  Left;Right;3 reps;20 seconds    Piriformis Stretch  Left;Right;3 reps;20 seconds   improved stretch on the Lt     Lumbar Exercises: Aerobic   Nustep  Level 4 x 10 minutes at begining of session, 5 minutes at end of session   PT present to monitor      Lumbar Exercises: Standing   Heel Raises  20 reps    Row  Strengthening;Both;20 reps;Theraband    Theraband Level (Row)  Level 2 (Red)    Shoulder Extension  Strengthening;Both;Theraband    Theraband Level (Shoulder Extension)  Level 2 (Red)      Knee/Hip Exercises: Standing   Hip Abduction  Stengthening;Both;2 sets;10 reps    Abduction Limitations  2#    Hip Extension  Stengthening;Both;2 sets;10 reps;Limitations    Extension Limitations  2#    Rocker Board  3 minutes    Rebounder  weight shifting 3 ways x 1 minute each             PT Education - 04/26/19 1127    Education Details  Access Code: [email or web address]    Person(s) Educated  Patient    Methods  Explanation;Demonstration;Handout    Comprehension  Verbalized understanding;Returned demonstration       PT Short Term Goals - 04/24/19 1120      PT SHORT TERM GOAL #1   Title  be independent in initial HEP    Status  Achieved      PT SHORT TERM GOAL #2   Title  report no sleep interruptions due to LBP    Baseline  it is improving- working on going to bed at the same time    Time  3    Period  Weeks    Status  On-going        PT Long Term Goals - 04/17/19 1445      PT LONG TERM GOAL #1   Title  be independent in advanced HEP    Time  6    Period  Weeks    Status  New    Target Date  05/29/19      PT LONG TERM GOAL #2   Title  reduce FOTO to < or = to 38% limitation    Time  6    Period  Weeks    Status  New    Target Date  05/29/19      PT LONG TERM GOAL #3   Title  verbalize and demonstrate body mechanics modifications for lumbar protection with home tasks    Time  6    Period  Weeks    Target Date   05/29/19      PT LONG TERM GOAL #4   Title  improve core and LE stregnth to perform cooking tasks with 50% less fatigue    Time  6    Status  New    Target Date  05/29/19            Plan - 04/26/19 1123    Clinical Impression Statement  Pt continues to work on strength, endurance and flexibility.  Pt denies any low back pain since last session.  Pt required verbal and tactile cues for abdominal engagement with standing exercises.  Pt demonstrates lumbar fatigue with standing scapular strength exercises.  Pt will continue to benefit from skilled PT to address functional strength deficits and address pain as needed.    PT Frequency  2x / week    PT Duration  6 weeks    PT Treatment/Interventions  ADLs/Self Care Home Management;Cryotherapy;Electrical Stimulation;Moist Heat;Traction;Neuromuscular re-education;Balance training;Therapeutic exercise;Therapeutic activities;Stair training;Patient/family education;Manual techniques;Dry needling;Passive range of motion;Taping    PT Next Visit Plan  core and hip strength, balance exercises    PT Home Exercise Plan  [email or web address]       Patient will benefit from skilled therapeutic intervention in order to improve the following deficits and impairments:  Decreased activity tolerance, Decreased strength, Postural dysfunction, Improper body mechanics, Impaired flexibility, Pain, Decreased endurance, Increased muscle spasms, Difficulty walking, Decreased range of motion  Visit Diagnosis: Acute bilateral low back pain without sciatica  Muscle weakness (generalized)     Problem List Patient Active Problem List   Diagnosis Date Noted  . Osteomyelitis ([place]) 11/29/2017  . 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 Acute kidney failure ([place]) 11/29/2017  . Multiple drug allergies 11/29/2017  . Depression 11/29/2017  . UTI due to extended-spectrum beta lactamase (ESBL) producing Escherichia coli 11/29/2017  . Bacteremia 11/29/2017  . Epidural  abscess 11/26/2017  . Obesity 11/26/2017     Sigurd Sos, PT 04/26/19 11:41 AM  [facility] [address], [place], [place], [address] Phone: [phone]   Fax:  [phone]  Name: Rebecca Porter MRN: [id number] Date of Birth: October 11, 1940

## 2019-04-26 NOTE — Patient Instructions (Signed)
Access Code: [email or web address]  URL: [email or web address]  Date: 04/26/2019  Prepared by: Sigurd Sos   Exercises  Scapular Retraction with Resistance - 10 reps - 2 sets - 2x daily - 7x weekly Seated Scapular Retraction - 10 reps - 1 sets - 5 hold - 5x daily - 7x weekly Shoulder extension with resistance - Neutral - 10 reps - 2 sets - 2x daily - 7x weekly

## 2019-05-01 ENCOUNTER — Other Ambulatory Visit: Payer: Self-pay

## 2019-05-01 ENCOUNTER — Ambulatory Visit: Payer: Medicare Other

## 2019-05-01 DIAGNOSIS — M6281 Muscle weakness (generalized): Secondary | ICD-10-CM | POA: Diagnosis not present

## 2019-05-01 DIAGNOSIS — M545 Low back pain, unspecified: Secondary | ICD-10-CM

## 2019-05-01 NOTE — Therapy (Signed)
[facility] [address], [place], [address] Phone: [phone]   Fax:  [phone]  Physical Therapy Treatment  Patient Details  Name: Rebecca Porter MRN: [id number] Date of Birth: [birth year] Referring Provider (PT): London Pepper, MD   Encounter Date: 05/01/2019  PT End of Session - 05/01/19 1143    Visit Number  5    Date for PT Re-Evaluation  05/29/19    Authorization Type  Medicare    PT Start Time  1059    PT Stop Time  1148    PT Time Calculation (min)  49 min    Activity Tolerance  Patient tolerated treatment well    Behavior During Therapy  [facility] for tasks assessed/performed       Past Medical History:  Diagnosis Date  . Cancer [facility])    breast cancer 2002   . Dyspnea    with exertion   . GERD (gastroesophageal reflux disease)   . History of kidney stones   . Personal history of radiation therapy 2002  . Sepsis ([place])    due to kidney stone blocking the flow per pt. family  10-2017  IV [facility]    Past Surgical History:  Procedure Laterality Date  . BREAST BIOPSY Left    malignant stereo   . BREAST LUMPECTOMY Left 2002  . CHOLECYSTECTOMY     1997  . CYSTOSCOPY     Left stone removal 12-31-17  . CYSTOSCOPY/URETEROSCOPY/HOLMIUM LASER/STENT PLACEMENT Left 01/10/2018   Procedure: CYSTOSCOPY, URETEROSCOPY,RETROGRADE PYLEGRAM,BASKET STONE EXTRACTION, AND  STENT REMOVAL;  Surgeon: Kathie Rhodes, MD;  Location: [facility];  Service: Urology;  Laterality: Left;  . IR LUMBAR DISC ASPIRATION W/IMG GUIDE  11/29/2017   abscess on spine  between L5 and S1   On IV Invanz   . RENAL ARTERY STENT     left November 07 2017    There were no vitals filed for this visit.  Subjective Assessment - 05/01/19 1108    Subjective  I am feeling good.    Currently in Pain?  No/denies                       [facility] Adult PT Treatment/Exercise - 05/01/19 0001      Lumbar Exercises: Stretches   Active  Hamstring Stretch  Left;Right;3 reps;20 seconds      Lumbar Exercises: Aerobic   Nustep  Level 4 x 10 minutes at begining of session, 5 minutes at end of session   PT present to monitor      Lumbar Exercises: Standing   Row  Strengthening;Both;20 reps;Theraband    Theraband Level (Row)  Level 2 (Red)    Shoulder Extension  Strengthening;Both;Theraband    Theraband Level (Shoulder Extension)  Level 2 (Red)      Knee/Hip Exercises: Standing   Hip Abduction  Stengthening;Both;2 sets;10 reps    Abduction Limitations  2#    Hip Extension  Stengthening;Both;2 sets;10 reps;Limitations    Extension Limitations  2#    Rocker Board  3 minutes    Rebounder  weight shifting 3 ways x 1 minute each             PT Education - 05/01/19 1129    Education Details  sleep hygiene    Person(s) Educated  Patient    Methods  Explanation;Handout    Comprehension  Verbalized understanding       PT Short Term Goals - 05/01/19 1108  PT SHORT TERM GOAL #2   Title  report no sleep interruptions due to LBP    Baseline  sleep is not interrupted by pain    Status  Achieved        PT Long Term Goals - 05/01/19 1110      PT LONG TERM GOAL #1   Title  be independent in advanced HEP    Time  6    Period  Weeks    Status  On-going      PT LONG TERM GOAL #3   Title  verbalize and demonstrate body mechanics modifications for lumbar protection with home tasks    Status  Achieved      PT LONG TERM GOAL #4   Title  improve core and LE strength to perform cooking tasks with 50% less fatigue            Plan - 05/01/19 1118    Clinical Impression Statement  Pt has been compliant with HEP for core strength and flexibility.  Pt has been able to sleep without limitation due to LBP.  Pt has not been sleeping well and PT discussed sleep hygiene and ways to improve sleep.  Pt requires minor verbal and tactile cues for technique with exercise today.  Pt will continue to benefit from skilled PT  to address strength, flexibility and endurance to improve function at home and in the community.    PT Frequency  2x / week    PT Duration  6 weeks    PT Treatment/Interventions  ADLs/Self Care Home Management;Cryotherapy;Electrical Stimulation;Moist Heat;Traction;Neuromuscular re-education;Balance training;Therapeutic exercise;Therapeutic activities;Stair training;Patient/family education;Manual techniques;Dry needling;Passive range of motion;Taping    PT Next Visit Plan  core and hip strength, balance exercises    PT Home Exercise Plan  [id number]    Consulted and Agree with Plan of Care  Patient       Patient will benefit from skilled therapeutic intervention in order to improve the following deficits and impairments:  Decreased activity tolerance, Decreased strength, Postural dysfunction, Improper body mechanics, Impaired flexibility, Pain, Decreased endurance, Increased muscle spasms, Difficulty walking, Decreased range of motion  Visit Diagnosis: Acute bilateral low back pain without sciatica  Muscle weakness (generalized)     Problem List Patient Active Problem List   Diagnosis Date Noted  . Osteomyelitis ([place]) 11/29/2017  . 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 Acute kidney failure ([place]) 11/29/2017  . Multiple drug allergies 11/29/2017  . Depression 11/29/2017  . UTI due to extended-spectrum beta lactamase (ESBL) producing Escherichia coli 11/29/2017  . Bacteremia 11/29/2017  . 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 Obesity 11/26/2017     Sigurd Sos, PT 05/01/19 11:50 AM  [facility] [address], [place], [place], [address] Phone: [phone]   Fax:  [phone]  Name: Rebecca Porter MRN: [id number] Date of Birth: 16-Jul-1940

## 2019-05-03 ENCOUNTER — Other Ambulatory Visit: Payer: Self-pay

## 2019-05-03 ENCOUNTER — Ambulatory Visit: Payer: Medicare Other

## 2019-05-03 DIAGNOSIS — M6281 Muscle weakness (generalized): Secondary | ICD-10-CM

## 2019-05-03 DIAGNOSIS — M545 Low back pain, unspecified: Secondary | ICD-10-CM

## 2019-05-03 NOTE — Therapy (Signed)
[facility] [address] [place], [place], [address] Phone: [phone]   Fax:  [phone]  Physical Therapy Treatment  Patient Details  Name: Rebecca Porter MRN: [id number] Date of Birth: Apr 12, 1941 Referring Provider (PT): London Pepper, MD   Encounter Date: 05/03/2019  PT End of Session - 05/03/19 1143    Visit Number  6    Date for PT Re-Evaluation  05/29/19    Authorization Type  Medicare    PT Start Time  1059   8 minutes unattended   PT Stop Time  1150    PT Time Calculation (min)  51 min    Activity Tolerance  Patient tolerated treatment well    Behavior During Therapy  [facility] for tasks assessed/performed       Past Medical History:  Diagnosis Date  . Cancer [facility])    breast cancer 2002   . Dyspnea    with exertion   . GERD (gastroesophageal reflux disease)   . History of kidney stones   . Personal history of radiation therapy 2002  . Sepsis ([place])    due to kidney stone blocking the flow per pt. family  10-2017  IV [facility]    Past Surgical History:  Procedure Laterality Date  . BREAST BIOPSY Left    malignant stereo   . BREAST LUMPECTOMY Left 2002  . CHOLECYSTECTOMY     1997  . CYSTOSCOPY     Left stone removal 12-31-17  . CYSTOSCOPY/URETEROSCOPY/HOLMIUM LASER/STENT PLACEMENT Left 01/10/2018   Procedure: CYSTOSCOPY, URETEROSCOPY,RETROGRADE PYLEGRAM,BASKET STONE EXTRACTION, AND  STENT REMOVAL;  Surgeon: Kathie Rhodes, MD;  Location: [facility];  Service: Urology;  Laterality: Left;  . IR LUMBAR DISC ASPIRATION W/IMG GUIDE  11/29/2017   abscess on spine  between L5 and S1   On IV Invanz   . RENAL ARTERY STENT     left November 07 2017    There were no vitals filed for this visit.  Subjective Assessment - 05/03/19 1113    Subjective  I slept better last night.  My Rt knee is hurting.    Currently in Pain?  No/denies                       [facility] Adult PT Treatment/Exercise - 05/03/19  0001      Lumbar Exercises: Stretches   Active Hamstring Stretch  Left;Right;3 reps;20 seconds      Lumbar Exercises: Aerobic   Nustep  Level 4 x 10 minutes at begining of session, 5 minutes at end of session   PT present to monitor      Lumbar Exercises: Seated   Other Seated Lumbar Exercises  press into foam roll 2x10 reps.  5 seconds      Knee/Hip Exercises: Standing   Hip Abduction  Stengthening;Both;2 sets;10 reps    Abduction Limitations  2#- standing on a black pad    Hip Extension  Stengthening;Both;2 sets;10 reps;Limitations    Extension Limitations  2#- standing on black pad    Rocker Board  3 minutes    Rebounder  weight shifting 3 ways x 1 minute each      Knee/Hip Exercises: Seated   Sit to Sand  2 sets;10 reps   seated on black pad              PT Short Term Goals - 05/01/19 1108      PT SHORT TERM GOAL #2   Title  report  no sleep interruptions due to LBP    Baseline  sleep is not interrupted by pain    Status  Achieved        PT Long Term Goals - 05/03/19 1115      PT LONG TERM GOAL #3   Status  Achieved            Plan - 05/03/19 1121    Clinical Impression Statement  Pt has been compliant with HEP for core strength and flexibility.  Pt has been able to sleep without limitation due to LBP.  Pt has general sleep limitations and PT provided sleep hygiene education.  Pt had improved sleep last night.  Pt requires minor verbal and tactile cues for technique with exercise today.  Pt will continue to benefit from skilled PT to address strength, flexibility and endurance to improve function at home and in the community.    PT Frequency  2x / week    PT Duration  6 weeks    PT Treatment/Interventions  ADLs/Self Care Home Management;Cryotherapy;Electrical Stimulation;Moist Heat;Traction;Neuromuscular re-education;Balance training;Therapeutic exercise;Therapeutic activities;Stair training;Patient/family education;Manual techniques;Dry needling;Passive  range of motion;Taping    PT Next Visit Plan  core and hip strength, balance exercises    PT Home Exercise Plan  [id number]    Consulted and Agree with Plan of Care  Patient       Patient will benefit from skilled therapeutic intervention in order to improve the following deficits and impairments:  Decreased activity tolerance, Decreased strength, Postural dysfunction, Improper body mechanics, Impaired flexibility, Pain, Decreased endurance, Increased muscle spasms, Difficulty walking, Decreased range of motion  Visit Diagnosis: Muscle weakness (generalized)  Acute bilateral low back pain without sciatica     Problem List Patient Active Problem List   Diagnosis Date Noted  . Osteomyelitis ([place]) 11/29/2017  . 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 Acute kidney failure ([place]) 11/29/2017  . Multiple drug allergies 11/29/2017  . Depression 11/29/2017  . UTI due to extended-spectrum beta lactamase (ESBL) producing Escherichia coli 11/29/2017  . Bacteremia 11/29/2017  . 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 Obesity 11/26/2017    Sigurd Sos, PT 05/03/19 11:46 AM  [facility] [address], [place], [place], [address] Phone: [phone]   Fax:  [phone]  Name: Rebecca Porter MRN: [id number] Date of Birth: [birth year]

## 2019-05-08 ENCOUNTER — Ambulatory Visit: Payer: Medicare Other

## 2019-05-08 ENCOUNTER — Other Ambulatory Visit: Payer: Self-pay

## 2019-05-08 DIAGNOSIS — M6281 Muscle weakness (generalized): Secondary | ICD-10-CM | POA: Diagnosis not present

## 2019-05-08 DIAGNOSIS — M545 Low back pain, unspecified: Secondary | ICD-10-CM

## 2019-05-08 NOTE — Therapy (Signed)
[facility] [address] [place], [place], [address] Phone: [phone]   Fax:  [phone]  Physical Therapy Treatment  Patient Details  Name: Rebecca Porter MRN: [id number] Date of Birth: 18-Feb-1941 Referring Provider (PT): London Pepper, MD   Encounter Date: 05/08/2019  PT End of Session - 05/08/19 1135    Visit Number  7    Date for PT Re-Evaluation  05/29/19    Authorization Type  Medicare    PT Start Time  1056    PT Stop Time  1141    PT Time Calculation (min)  45 min    Activity Tolerance  Patient tolerated treatment well    Behavior During Therapy  [facility] for tasks assessed/performed       Past Medical History:  Diagnosis Date  . Cancer [facility])    breast cancer 2002   . Dyspnea    with exertion   . GERD (gastroesophageal reflux disease)   . History of kidney stones   . Personal history of radiation therapy 2002  . Sepsis ([place])    due to kidney stone blocking the flow per pt. family  10-2017  IV [facility]    Past Surgical History:  Procedure Laterality Date  . BREAST BIOPSY Left    malignant stereo   . BREAST LUMPECTOMY Left 2002  . CHOLECYSTECTOMY     1997  . CYSTOSCOPY     Left stone removal 12-31-17  . CYSTOSCOPY/URETEROSCOPY/HOLMIUM LASER/STENT PLACEMENT Left 01/10/2018   Procedure: CYSTOSCOPY, URETEROSCOPY,RETROGRADE PYLEGRAM,BASKET STONE EXTRACTION, AND  STENT REMOVAL;  Surgeon: Kathie Rhodes, MD;  Location: [facility];  Service: Urology;  Laterality: Left;  . IR LUMBAR DISC ASPIRATION W/IMG GUIDE  11/29/2017   abscess on spine  between L5 and S1   On IV Invanz   . RENAL ARTERY STENT     left November 07 2017    There were no vitals filed for this visit.  Subjective Assessment - 05/08/19 1108    Subjective  I am using my ankle weights at home now.    Currently in Pain?  No/denies                       [facility] Adult PT Treatment/Exercise - 05/08/19 0001      Lumbar Exercises:  Stretches   Active Hamstring Stretch  Left;Right;3 reps;20 seconds      Lumbar Exercises: Aerobic   Nustep  Level 4 x 10 minutes at begining of session, 5 minutes at end of session   PT present to monitor      Lumbar Exercises: Seated   Other Seated Lumbar Exercises  press into foam roll 2x10 reps.  5 seconds      Knee/Hip Exercises: Standing   Hip Abduction  Stengthening;Both;2 sets;10 reps    Abduction Limitations  2#- standing on a black pad    Hip Extension  Stengthening;Both;2 sets;10 reps;Limitations    Extension Limitations  2#- standing on black pad    Rocker Board  3 minutes    Rebounder  weight shifting 3 ways x 1 minute each    Walking with Sports Cord  20# forward and reverse                PT Short Term Goals - 05/08/19 1109      PT SHORT TERM GOAL #2   Title  report no sleep interruptions due to LBP    Baseline  sleep is not interrupted  by pain    Status  Achieved        PT Long Term Goals - 05/08/19 1109      PT LONG TERM GOAL #1   Title  be independent in advanced HEP    Time  6    Status  On-going      PT LONG TERM GOAL #4   Title  improve core and LE strength to perform cooking tasks with 50% less fatigue    Time  6    Period  Weeks    Status  On-going            Plan - 05/08/19 1138    Clinical Impression Statement  Pt has been compliant with HEP for core strength and flexibility.  Sleep has been variable due to chronic sleep dysfunction.   Pt was able to tolerate advanced activity in the clinic this week.   Pt requires minor verbal and tactile cues for technique with exercise today.  Pt will continue to benefit from skilled PT to address strength, flexibility and endurance to improve function at home and in the community.    PT Frequency  2x / week    PT Duration  6 weeks    PT Treatment/Interventions  ADLs/Self Care Home Management;Cryotherapy;Electrical Stimulation;Moist Heat;Traction;Neuromuscular re-education;Balance  training;Therapeutic exercise;Therapeutic activities;Stair training;Patient/family education;Manual techniques;Dry needling;Passive range of motion;Taping    PT Next Visit Plan  core and hip strength, balance exercises    PT Home Exercise Plan  [id number]    Consulted and Agree with Plan of Care  Patient       Patient will benefit from skilled therapeutic intervention in order to improve the following deficits and impairments:  Decreased activity tolerance, Decreased strength, Postural dysfunction, Improper body mechanics, Impaired flexibility, Pain, Decreased endurance, Increased muscle spasms, Difficulty walking, Decreased range of motion  Visit Diagnosis: Acute bilateral low back pain without sciatica  Muscle weakness (generalized)     Problem List Patient Active Problem List   Diagnosis Date Noted  . Osteomyelitis ([place]) 11/29/2017  . 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 Acute kidney failure ([place]) 11/29/2017  . Multiple drug allergies 11/29/2017  . Depression 11/29/2017  . UTI due to extended-spectrum beta lactamase (ESBL) producing Escherichia coli 11/29/2017  . Bacteremia 11/29/2017  . 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 Obesity 11/26/2017    Sigurd Sos, PT 05/08/19 11:38 AM  [facility] [address] [place], [place], [address] Phone: [phone]   Fax:  [phone]  Name: Rebecca Porter MRN: [id number] Date of Birth: Jul 22, 1940

## 2019-05-10 ENCOUNTER — Ambulatory Visit: Payer: Medicare Other

## 2019-05-10 ENCOUNTER — Other Ambulatory Visit: Payer: Self-pay

## 2019-05-10 DIAGNOSIS — M6281 Muscle weakness (generalized): Secondary | ICD-10-CM

## 2019-05-10 DIAGNOSIS — M545 Low back pain, unspecified: Secondary | ICD-10-CM

## 2019-05-10 NOTE — Therapy (Signed)
[facility] [address] [place], [place], [address] Phone: [phone]   Fax:  [phone]  Physical Therapy Treatment  Patient Details  Name: Rebecca Porter MRN: [id number] Date of Birth: 1940/11/27 Referring Provider (PT): London Pepper, MD   Encounter Date: 05/10/2019  PT End of Session - 05/10/19 1133    Visit Number  8    Date for PT Re-Evaluation  05/29/19    Authorization Type  Medicare    PT Start Time  1059    PT Stop Time  1145    PT Time Calculation (min)  46 min    Activity Tolerance  Patient tolerated treatment well    Behavior During Therapy  [facility] for tasks assessed/performed       Past Medical History:  Diagnosis Date  . Cancer [facility])    breast cancer 2002   . Dyspnea    with exertion   . GERD (gastroesophageal reflux disease)   . History of kidney stones   . Personal history of radiation therapy 2002  . Sepsis ([place])    due to kidney stone blocking the flow per pt. family  10-2017  IV [facility]    Past Surgical History:  Procedure Laterality Date  . BREAST BIOPSY Left    malignant stereo   . BREAST LUMPECTOMY Left 2002  . CHOLECYSTECTOMY     1997  . CYSTOSCOPY     Left stone removal 12-31-17  . CYSTOSCOPY/URETEROSCOPY/HOLMIUM LASER/STENT PLACEMENT Left 01/10/2018   Procedure: CYSTOSCOPY, URETEROSCOPY,RETROGRADE PYLEGRAM,BASKET STONE EXTRACTION, AND  STENT REMOVAL;  Surgeon: Kathie Rhodes, MD;  Location[facility];  Service: Urology;  Laterality: Left;  . IR LUMBAR DISC ASPIRATION W/IMG GUIDE  11/29/2017   abscess on spine  between L5 and S1   On IV Invanz   . RENAL ARTERY STENT     left November 07 2017    There were no vitals filed for this visit.  Subjective Assessment - 05/10/19 1103    Subjective  My Rt knee is bothering me a little bit today.    Currently in Pain?  No/denies                       [facility] Adult PT Treatment/Exercise - 05/10/19 0001      Lumbar Exercises:  Stretches   Active Hamstring Stretch  Left;Right;3 reps;20 seconds      Lumbar Exercises: Aerobic   Nustep  Level 5 x 10 minutes at begining of session, 5 minutes at end of session   PT present to monitor      Lumbar Exercises: Standing   Other Standing Lumbar Exercises  on black pad: diagonals with red ball 2x10 bil.      Lumbar Exercises: Seated   Other Seated Lumbar Exercises  press into foam roll 2x10 reps.  5 seconds      Knee/Hip Exercises: Standing   Hip Abduction  Stengthening;Both;2 sets;10 reps    Abduction Limitations  2#- standing on a black pad    Hip Extension  Stengthening;Both;2 sets;10 reps;Limitations    Extension Limitations  2#- standing on black pad    Rocker Board  3 minutes    Rebounder  weight shifting 3 ways x 1 minute each    Walking with Sports Cord  20# forward and reverse                PT Short Term Goals - 05/08/19 1109  PT SHORT TERM GOAL #2   Title  report no sleep interruptions due to LBP    Baseline  sleep is not interrupted by pain    Status  Achieved        PT Long Term Goals - 05/10/19 1114      PT LONG TERM GOAL #3   Title  verbalize and demonstrate body mechanics modifications for lumbar protection with home tasks    Status  Achieved            Plan - 05/10/19 1116    Clinical Impression Statement  Pt has been compliant with HEP for core strength and flexibility.  Pt has been able to increase her weights and reps at home. Pt denies any LBP or significant fatigue with functional tasks at home.  Pt requires intermittent verbal and tactile cues for technique with exercise today.  Pt will continue to benefit from skilled PT to address strength, flexibility and endurance to improve function at home and in the community.    PT Frequency  2x / week    PT Duration  6 weeks    PT Treatment/Interventions  ADLs/Self Care Home Management;Cryotherapy;Electrical Stimulation;Moist Heat;Traction;Neuromuscular re-education;Balance  training;Therapeutic exercise;Therapeutic activities;Stair training;Patient/family education;Manual techniques;Dry needling;Passive range of motion;Taping    PT Next Visit Plan  core and hip strength, balance exercises    PT Home Exercise Plan  [id number]    Consulted and Agree with Plan of Care  Patient       Patient will benefit from skilled therapeutic intervention in order to improve the following deficits and impairments:  Decreased activity tolerance, Decreased strength, Postural dysfunction, Improper body mechanics, Impaired flexibility, Pain, Decreased endurance, Increased muscle spasms, Difficulty walking, Decreased range of motion  Visit Diagnosis: Acute bilateral low back pain without sciatica  Muscle weakness (generalized)     Problem List Patient Active Problem List   Diagnosis Date Noted  . Osteomyelitis ([place]) 11/29/2017  . 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 Acute kidney failure ([place]) 11/29/2017  . Multiple drug allergies 11/29/2017  . Depression 11/29/2017  . UTI due to extended-spectrum beta lactamase (ESBL) producing Escherichia coli 11/29/2017  . Bacteremia 11/29/2017  . 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 Obesity 11/26/2017     Sigurd Sos, PT 05/10/19 11:34 AM  [facility] [address], [place], [place], [address] Phone: [phone]   Fax:  [phone]  Name: Rebecca Porter MRN: [id number] Date of Birth: [birth year]

## 2019-05-15 ENCOUNTER — Ambulatory Visit: Payer: Medicare Other | Attending: Family Medicine

## 2019-05-15 ENCOUNTER — Other Ambulatory Visit: Payer: Self-pay

## 2019-05-15 DIAGNOSIS — M545 Low back pain, unspecified: Secondary | ICD-10-CM

## 2019-05-15 DIAGNOSIS — M6281 Muscle weakness (generalized): Secondary | ICD-10-CM | POA: Diagnosis not present

## 2019-05-15 NOTE — Therapy (Signed)
[facility] [address], [place], [place], [address] Phone: [phone]   Fax:  [phone]  Physical Therapy Treatment  Patient Details  Name: Rebecca Porter MRN: [id number] Date of Birth: 10/02/1940 Referring Provider (PT): London Pepper, MD   Encounter Date: 05/15/2019  PT End of Session - 05/15/19 1143    Visit Number  9    Date for PT Re-Evaluation  05/29/19    Authorization Type  Medicare    PT Start Time  1108    PT Stop Time  1148    PT Time Calculation (min)  40 min    Activity Tolerance  Patient tolerated treatment well    Behavior During Therapy  [facility] for tasks assessed/performed       Past Medical History:  Diagnosis Date  . Cancer [facility])    breast cancer 2002   . Dyspnea    with exertion   . GERD (gastroesophageal reflux disease)   . History of kidney stones   . Personal history of radiation therapy 2002  . Sepsis ([place])    due to kidney stone blocking the flow per pt. family  10-2017  IV [facility]    Past Surgical History:  Procedure Laterality Date  . BREAST BIOPSY Left    malignant stereo   . BREAST LUMPECTOMY Left 2002  . CHOLECYSTECTOMY     1997  . CYSTOSCOPY     Left stone removal 12-31-17  . CYSTOSCOPY/URETEROSCOPY/HOLMIUM LASER/STENT PLACEMENT Left 01/10/2018   Procedure: CYSTOSCOPY, URETEROSCOPY,RETROGRADE PYLEGRAM,BASKET STONE EXTRACTION, AND  STENT REMOVAL;  Surgeon: Kathie Rhodes, MD;  [facility]: [facility];  Service: Urology;  Laterality: Left;  . IR LUMBAR DISC ASPIRATION W/IMG GUIDE  11/29/2017   abscess on spine  between L5 and S1   On IV Invanz   . RENAL ARTERY STENT     left November 07 2017    There were no vitals filed for this visit.  Subjective Assessment - 05/15/19 1112    Subjective  I ate too much fruit yesterday and it upset my stomach.  The weather has really been bothering my knee.    Currently in Pain?  Yes    Pain Score  5     Pain Location  Knee    Pain Orientation   Right    Pain Descriptors / Indicators  Aching    Pain Type  Acute pain    Pain Onset  1 to 4 weeks ago    Pain Frequency  Intermittent    Aggravating Factors   sleep at night, weather changes    Pain Relieving Factors  rest, CBD oil                       [facility] Adult PT Treatment/Exercise - 05/15/19 0001      Lumbar Exercises: Aerobic   Nustep  Level 5 x 10 minutes at begining of session, 5 minutes at end of session   PT present to monitor      Lumbar Exercises: Standing   Row  Strengthening;Both;20 reps;Theraband    Theraband Level (Row)  Level 2 (Red)    Row Limitations  standing on black pad    Shoulder Extension  Strengthening;20 reps;Theraband    Theraband Level (Shoulder Extension)  Level 2 (Red)    Shoulder Extension Limitations  standing on black pad      Knee/Hip Exercises: Standing   Hip Abduction  Stengthening;Both;2 sets;10 reps    Abduction  Limitations  3#- standing on a black pad    Hip Extension  Stengthening;Both;2 sets;10 reps;Limitations    Extension Limitations  3#- standing on black pad    Rocker Board  3 minutes    Rebounder  weight shifting 3 ways x 1 minute each    Walking with Sports Cord  25# forward and reverse                PT Short Term Goals - 05/08/19 1109      PT SHORT TERM GOAL #2   Title  report no sleep interruptions due to LBP    Baseline  sleep is not interrupted by pain    Status  Achieved        PT Long Term Goals - 05/10/19 1114      PT LONG TERM GOAL #3   Title  verbalize and demonstrate body mechanics modifications for lumbar protection with home tasks    Status  Achieved            Plan - 05/15/19 1123    Clinical Impression Statement  Pt has been compliant with HEP for core strength and flexibility.  Pt now uses ankle weights at home.  Pt with increased Rt knee pain due to the weather and experienced pain with standing exercises. Pt continues to report no back pain but she has not been doing  as much prolonged standing at home.   Pt requires intermittent verbal and tactile cues for technique with exercise today.  Pt will continue to benefit from skilled PT to address strength, flexibility and endurance to improve function at home and in the community.    PT Treatment/Interventions  ADLs/Self Care Home Management;Cryotherapy;Electrical Stimulation;Moist Heat;Traction;Neuromuscular re-education;Balance training;Therapeutic exercise;Therapeutic activities;Stair training;Patient/family education;Manual techniques;Dry needling;Passive range of motion;Taping    PT Next Visit Plan  core and hip strength, balance exercises.  10th visit progress note    PT Home Exercise Plan  [id number]    Consulted and Agree with Plan of Care  Patient       Patient will benefit from skilled therapeutic intervention in order to improve the following deficits and impairments:  Decreased activity tolerance, Decreased strength, Postural dysfunction, Improper body mechanics, Impaired flexibility, Pain, Decreased endurance, Increased muscle spasms, Difficulty walking, Decreased range of motion  Visit Diagnosis: Muscle weakness (generalized)  Acute bilateral low back pain without sciatica     Problem List Patient Active Problem List   Diagnosis Date Noted  . Osteomyelitis ([place]) 11/29/2017  . 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 Acute kidney failure ([place]) 11/29/2017  . Multiple drug allergies 11/29/2017  . Depression 11/29/2017  . UTI due to extended-spectrum beta lactamase (ESBL) producing Escherichia coli 11/29/2017  . Bacteremia 11/29/2017  . 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 Obesity 11/26/2017    Sigurd Sos, PT 05/15/19 11:45 AM  [facility] [address] [place], [place], [address] Phone: [phone]   Fax:  [phone]  Name: JONES DELAPORTE MRN: [id number] Date of Birth: 08-Aug-1940

## 2019-05-17 ENCOUNTER — Other Ambulatory Visit: Payer: Self-pay

## 2019-05-17 ENCOUNTER — Ambulatory Visit: Payer: Medicare Other

## 2019-05-17 DIAGNOSIS — M6281 Muscle weakness (generalized): Secondary | ICD-10-CM

## 2019-05-17 DIAGNOSIS — M545 Low back pain, unspecified: Secondary | ICD-10-CM

## 2019-05-17 NOTE — Therapy (Signed)
[facility] [address], [place], [place], [address] Phone: [phone]   Fax:  [phone]  Physical Therapy Treatment  Patient Details  Name: Rebecca Porter MRN: [id number] Date of Birth: 11-17-1940 Referring Provider (PT): London Pepper, MD   Encounter Date: 05/17/2019 Progress Note Reporting Period 04/17/19 to 05/17/19  See note below for Objective Data and Assessment of Progress/Goals.      PT End of Session - 05/17/19 1142    Visit Number  10    Date for PT Re-Evaluation  05/29/19    Authorization Type  Medicare    PT Start Time  1052    PT Stop Time  1148   8 minutes unattended   PT Time Calculation (min)  56 min       Past Medical History:  Diagnosis Date  . Cancer [facility])    breast cancer 2002   . Dyspnea    with exertion   . GERD (gastroesophageal reflux disease)   . History of kidney stones   . Personal history of radiation therapy 2002  . Sepsis ([place])    due to kidney stone blocking the flow per pt. family  10-2017  IV [facility]    Past Surgical History:  Procedure Laterality Date  . BREAST BIOPSY Left    malignant stereo   . BREAST LUMPECTOMY Left 2002  . CHOLECYSTECTOMY     1997  . CYSTOSCOPY     Left stone removal 12-31-17  . CYSTOSCOPY/URETEROSCOPY/HOLMIUM LASER/STENT PLACEMENT Left 01/10/2018   Procedure: CYSTOSCOPY, URETEROSCOPY,RETROGRADE PYLEGRAM,BASKET STONE EXTRACTION, AND  STENT REMOVAL;  Surgeon: Kathie Rhodes, MD;  [facility];  Service: Urology;  Laterality: Left;  . IR LUMBAR DISC ASPIRATION W/IMG GUIDE  11/29/2017   abscess on spine  between L5 and S1   On IV Invanz   . RENAL ARTERY STENT     left November 07 2017    There were no vitals filed for this visit.  Subjective Assessment - 05/17/19 1103    Subjective  I am feeling better today.    Currently in Pain?  No/denies    Pain Score  --   upper back pain yesterday 5/10        [facility] PT Assessment - 05/17/19 0001       Assessment   Medical Diagnosis  back pain    Referring Provider (PT)  London Pepper, MD    Onset Date/Surgical Date  03/27/19      Cognition   Overall Cognitive Status  Within Functional Limits for tasks assessed      Observation/Other Assessments   Focus on Therapeutic Outcomes (FOTO)   37% limitation      Strength   Right Hip Flexion  4/5    Left Hip Flexion  4/5    Right Knee Flexion  4+/5    Right Knee Extension  4+/5    Left Knee Flexion  4+/5    Left Knee Extension  4+/5      Transfers   Transfers  Independent with all Transfers                   [facility] Adult PT Treatment/Exercise - 05/17/19 0001      Lumbar Exercises: Aerobic   Nustep  Level 5 x 10 minutes at begining of session, 5 minutes at end of session   PT present to monitor      Lumbar Exercises: Standing   Heel Raises  20 reps  Knee/Hip Exercises: Standing   Hip Abduction  Stengthening;Both;2 sets;10 reps    Abduction Limitations  3#- standing on a black pad    Hip Extension  Stengthening;Both;2 sets;10 reps;Limitations    Extension Limitations  3#- standing on black pad    Rocker Board  3 minutes    Rebounder  weight shifting 3 ways x 1 minute each    Walking with Sports Cord  25# forward and reverse                PT Short Term Goals - 05/08/19 1109      PT SHORT TERM GOAL #2   Title  report no sleep interruptions due to LBP    Baseline  sleep is not interrupted by pain    Status  Achieved        PT Long Term Goals - 05/17/19 1111      PT LONG TERM GOAL #1   Title  be independent in advanced HEP    Time  6    Period  Weeks    Status  On-going      PT LONG TERM GOAL #2   Title  reduce FOTO to < or = to 38% limitation    Baseline  37%    Status  Achieved      PT LONG TERM GOAL #3   Title  verbalize and demonstrate body mechanics modifications for lumbar protection with home tasks    Status  Achieved      PT LONG TERM GOAL #4   Title  improve core and LE  strength to perform cooking tasks with 50% less fatigue    Baseline  50% improvement    Status  Achieved            Plan - 05/17/19 1125    Clinical Impression Statement  Pt reports 50% improved endurance for standing and housework.  Pt with improved bil hip and knee strength.  FOTO is improved to 37% limitation.  Pt is able to tolerate advanced exercise in the clinic and continues with strength and flexibility at home.  Pt requires tactile and verbal cues for technique with exercise to improve core activation and alignment.  Pt will continue to benefit from skilled PT for strength, flexibility and endurance.    PT Frequency  2x / week    PT Duration  6 weeks    PT Treatment/Interventions  ADLs/Self Care Home Management;Cryotherapy;Electrical Stimulation;Moist Heat;Traction;Neuromuscular re-education;Balance training;Therapeutic exercise;Therapeutic activities;Stair training;Patient/family education;Manual techniques;Dry needling;Passive range of motion;Taping    PT Next Visit Plan  core and hip strength, balance exercises.    PT Home Exercise Plan  [id number]    Consulted and Agree with Plan of Care  Patient       Patient will benefit from skilled therapeutic intervention in order to improve the following deficits and impairments:  Decreased activity tolerance, Decreased strength, Postural dysfunction, Improper body mechanics, Impaired flexibility, Pain, Decreased endurance, Increased muscle spasms, Difficulty walking, Decreased range of motion  Visit Diagnosis: Muscle weakness (generalized)  Acute bilateral low back pain without sciatica     Problem List Patient Active Problem List   Diagnosis Date Noted  . Osteomyelitis ([place]) 11/29/2017  . 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 Acute kidney failure ([place]) 11/29/2017  . Multiple drug allergies 11/29/2017  . Depression 11/29/2017  . UTI due to extended-spectrum beta lactamase (ESBL) producing Escherichia coli 11/29/2017   . Bacteremia 11/29/2017  . 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 Obesity 11/26/2017  Sigurd Sos, PT 05/17/19 11:46 AM  [facility] [address] [place], [place], [address] Phone: [phone]   Fax:  [phone]  Name: AUDELIA LESSO MRN: [id number] Date of Birth: 24-Aug-1940

## 2019-05-22 ENCOUNTER — Other Ambulatory Visit: Payer: Self-pay

## 2019-05-22 ENCOUNTER — Ambulatory Visit: Payer: Medicare Other

## 2019-05-22 DIAGNOSIS — M6281 Muscle weakness (generalized): Secondary | ICD-10-CM

## 2019-05-22 DIAGNOSIS — M545 Low back pain, unspecified: Secondary | ICD-10-CM

## 2019-05-22 NOTE — Therapy (Signed)
[facility] [address], [place], [place], [address] Phone: [phone]   Fax:  [phone]  Physical Therapy Treatment  Patient Details  Name: Rebecca Porter MRN: [id number] Date of Birth: 1940/10/21 Referring Provider (PT): London Pepper, MD   Encounter Date: 05/22/2019  PT End of Session - 05/22/19 1158    Visit Number  11    Date for PT Re-Evaluation  05/29/19    Authorization Type  Medicare    PT Start Time  1100   5 minutes at end unattended   PT Stop Time  1155    PT Time Calculation (min)  55 min    Activity Tolerance  Patient tolerated treatment well    Behavior During Therapy  [facility] for tasks assessed/performed       Past Medical History:  Diagnosis Date  . Cancer [facility])    breast cancer 2002   . Dyspnea    with exertion   . GERD (gastroesophageal reflux disease)   . History of kidney stones   . Personal history of radiation therapy 2002  . Sepsis ([place])    due to kidney stone blocking the flow per pt. family  10-2017  IV [facility]    Past Surgical History:  Procedure Laterality Date  . BREAST BIOPSY Left    malignant stereo   . BREAST LUMPECTOMY Left 2002  . CHOLECYSTECTOMY     1997  . CYSTOSCOPY     Left stone removal 12-31-17  . CYSTOSCOPY/URETEROSCOPY/HOLMIUM LASER/STENT PLACEMENT Left 01/10/2018   Procedure: CYSTOSCOPY, URETEROSCOPY,RETROGRADE PYLEGRAM,BASKET STONE EXTRACTION, AND  STENT REMOVAL;  Surgeon: Kathie Rhodes, MD;  [facility];  Service: Urology;  Laterality: Left;  . IR LUMBAR DISC ASPIRATION W/IMG GUIDE  11/29/2017   abscess on spine  between L5 and S1   On IV Invanz   . RENAL ARTERY STENT     left November 07 2017    There were no vitals filed for this visit.  Subjective Assessment - 05/22/19 1200    Subjective  My Rt knee dislocated yesterday.  It was painful and it is feeling better now.    Currently in Pain?  Yes    Pain Location  Knee    Pain Orientation  Right                        [facility] Adult PT Treatment/Exercise - 05/22/19 0001      Lumbar Exercises: Aerobic   Recumbent Bike  Level 4x 5 minutes    end of session-unattended   Nustep  Level 5 x 10 minutes at begining of session   PT present to monitor      Lumbar Exercises: Standing   Heel Raises  20 reps    Heel Raises Limitations  on balance pad      Knee/Hip Exercises: Standing   Hip Abduction  Stengthening;Both;2 sets;10 reps    Abduction Limitations  3#- standing on a black pad    Hip Extension  Stengthening;Both;2 sets;10 reps;Limitations    Extension Limitations  3#- standing on black pad    Rocker Board  3 minutes    Rebounder  weight shifting 3 ways x 1 minute each    Walking with Sports Cord  25# forward and reverse     Other Standing Knee Exercises  tandem stance on balance pad: 2x20 seconds each leg               PT  Short Term Goals - 05/08/19 1109      PT SHORT TERM GOAL #2   Title  report no sleep interruptions due to LBP    Baseline  sleep is not interrupted by pain    Status  Achieved        PT Long Term Goals - 05/17/19 1111      PT LONG TERM GOAL #1   Title  be independent in advanced HEP    Time  6    Period  Weeks    Status  On-going      PT LONG TERM GOAL #2   Title  reduce FOTO to < or = to 38% limitation    Baseline  37%    Status  Achieved      PT LONG TERM GOAL #3   Title  verbalize and demonstrate body mechanics modifications for lumbar protection with home tasks    Status  Achieved      PT LONG TERM GOAL #4   Title  improve core and LE strength to perform cooking tasks with 50% less fatigue    Baseline  50% improvement    Status  Achieved            Plan - 05/22/19 1109    Clinical Impression Statement  Pt reports 50% improved endurance for standing and housework.  Pt has demonstrated improved bil hip and knee strength last week.  Pt is able to tolerate advanced exercise in the clinic including balance and  continues with strength and flexibility at home.  Pt requires tactile and verbal cues for technique with exercise to improve core activation and alignment.  Pt will continue to benefit from skilled PT for strength, flexibility and endurance.    PT Frequency  2x / week    PT Duration  6 weeks    PT Treatment/Interventions  ADLs/Self Care Home Management;Cryotherapy;Electrical Stimulation;Moist Heat;Traction;Neuromuscular re-education;Balance training;Therapeutic exercise;Therapeutic activities;Stair training;Patient/family education;Manual techniques;Dry needling;Passive range of motion;Taping    PT Next Visit Plan  core and hip strength, balance exercises.    PT Home Exercise Plan  [id number]    Consulted and Agree with Plan of Care  Patient       Patient will benefit from skilled therapeutic intervention in order to improve the following deficits and impairments:  Decreased activity tolerance, Decreased strength, Postural dysfunction, Improper body mechanics, Impaired flexibility, Pain, Decreased endurance, Increased muscle spasms, Difficulty walking, Decreased range of motion  Visit Diagnosis: Acute bilateral low back pain without sciatica  Muscle weakness (generalized)     Problem List Patient Active Problem List   Diagnosis Date Noted  . Osteomyelitis ([place]) 11/29/2017  . 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 Acute kidney failure ([place]) 11/29/2017  . Multiple drug allergies 11/29/2017  . Depression 11/29/2017  . UTI due to extended-spectrum beta lactamase (ESBL) producing Escherichia coli 11/29/2017  . Bacteremia 11/29/2017  . 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 Obesity 11/26/2017     Sigurd Sos, PT 05/22/19 12:01 PM  [facility] [address], [place], [place], [address] Phone: [phone]   Fax:  [phone]  Name: Rebecca Porter MRN: [id number] Date of Birth: 02-10-1941

## 2019-05-24 ENCOUNTER — Ambulatory Visit: Payer: Medicare Other

## 2019-05-24 ENCOUNTER — Other Ambulatory Visit: Payer: Self-pay

## 2019-05-24 DIAGNOSIS — M6281 Muscle weakness (generalized): Secondary | ICD-10-CM

## 2019-05-24 DIAGNOSIS — M545 Low back pain, unspecified: Secondary | ICD-10-CM

## 2019-05-24 NOTE — Therapy (Signed)
[facility] [address], [place], [place], [address] Phone: [phone]   Fax:  [phone]  Physical Therapy Treatment  Patient Details  Name: Rebecca Porter MRN: [id number] Date of Birth: [birth year] Referring Provider (PT): London Pepper, MD   Encounter Date: 05/24/2019  PT End of Session - 05/24/19 1145    Visit Number  12    Date for PT Re-Evaluation  05/29/19    Authorization Type  Medicare    PT Start Time  1055    PT Stop Time  1144    PT Time Calculation (min)  49 min    Activity Tolerance  Patient tolerated treatment well    Behavior During Therapy  [facility] for tasks assessed/performed       Past Medical History:  Diagnosis Date  . Cancer [facility])    breast cancer 2002   . Dyspnea    with exertion   . GERD (gastroesophageal reflux disease)   . History of kidney stones   . Personal history of radiation therapy 2002  . Sepsis ([place])    due to kidney stone blocking the flow per pt. family  10-2017  IV [facility]    Past Surgical History:  Procedure Laterality Date  . BREAST BIOPSY Left    malignant stereo   . BREAST LUMPECTOMY Left 2002  . CHOLECYSTECTOMY     1997  . CYSTOSCOPY     Left stone removal 12-31-17  . CYSTOSCOPY/URETEROSCOPY/HOLMIUM LASER/STENT PLACEMENT Left 01/10/2018   Procedure: CYSTOSCOPY, URETEROSCOPY,RETROGRADE PYLEGRAM,BASKET STONE EXTRACTION, AND  STENT REMOVAL;  Surgeon: Kathie Rhodes, MD;  [facility];  Service: Urology;  Laterality: Left;  . IR LUMBAR DISC ASPIRATION W/IMG GUIDE  11/29/2017   abscess on spine  between L5 and S1   On IV Invanz   . RENAL ARTERY STENT     left November 07 2017    There were no vitals filed for this visit.  Subjective Assessment - 05/24/19 1112    Subjective  My Rt knee didn't like the bike last session.    Currently in Pain?  Yes    Pain Score  6     Pain Location  Knee    Pain Orientation  Right    Pain Descriptors / Indicators  Aching    Pain  Type  Acute pain    Pain Onset  1 to 4 weeks ago    Pain Frequency  Intermittent    Aggravating Factors   sleep at night, steps    Pain Relieving Factors  rest, CBD oil                       [facility] Adult PT Treatment/Exercise - 05/24/19 0001      Lumbar Exercises: Aerobic   Nustep  Level 5 x 10 minutes at begining of session   PT present to monitor      Lumbar Exercises: Standing   Heel Raises  20 reps    Heel Raises Limitations  on mini tramp      Knee/Hip Exercises: Standing   SLS  5x 10 seconds Rt and Lt     Rebounder  weight shifting 3 ways x 1 minute each    Walking with Sports Cord  25# forward and reverse x10, sidestepping x 5 each 15#    Other Standing Knee Exercises  tandem stance on balance pad: 2x20 seconds each leg  PT Short Term Goals - 05/08/19 1109      PT SHORT TERM GOAL #2   Title  report no sleep interruptions due to LBP    Baseline  sleep is not interrupted by pain    Status  Achieved        PT Long Term Goals - 05/17/19 1111      PT LONG TERM GOAL #1   Title  be independent in advanced HEP    Time  6    Period  Weeks    Status  On-going      PT LONG TERM GOAL #2   Title  reduce FOTO to < or = to 38% limitation    Baseline  37%    Status  Achieved      PT LONG TERM GOAL #3   Title  verbalize and demonstrate body mechanics modifications for lumbar protection with home tasks    Status  Achieved      PT LONG TERM GOAL #4   Title  improve core and LE strength to perform cooking tasks with 50% less fatigue    Baseline  50% improvement    Status  Achieved            Plan - 05/24/19 1127    Clinical Impression Statement  Pt without LBP at all now.  Pt advanced to higher level balance exercises today and was able to stabilize on balance pad in tandem stance and in single limb stance on level surface.  Pt required close contact assistance with balance exercises.  Pt requires verbal cues for core activation  with standing exercise to improve balance.  Pt will be reassessed next session and new goals set.    PT Frequency  2x / week    PT Duration  6 weeks    PT Treatment/Interventions  ADLs/Self Care Home Management;Cryotherapy;Electrical Stimulation;Moist Heat;Traction;Neuromuscular re-education;Balance training;Therapeutic exercise;Therapeutic activities;Stair training;Patient/family education;Manual techniques;Dry needling;Passive range of motion;Taping    PT Next Visit Plan  core and hip strength, balance exercises.    PT Home Exercise Plan  [id number]    Consulted and Agree with Plan of Care  Patient       Patient will benefit from skilled therapeutic intervention in order to improve the following deficits and impairments:  Decreased activity tolerance, Decreased strength, Postural dysfunction, Improper body mechanics, Impaired flexibility, Pain, Decreased endurance, Increased muscle spasms, Difficulty walking, Decreased range of motion  Visit Diagnosis: Acute bilateral low back pain without sciatica  Muscle weakness (generalized)     Problem List Patient Active Problem List   Diagnosis Date Noted  . Osteomyelitis ([place]) 11/29/2017  . 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 Acute kidney failure ([place]) 11/29/2017  . Multiple drug allergies 11/29/2017  . Depression 11/29/2017  . UTI due to extended-spectrum beta lactamase (ESBL) producing Escherichia coli 11/29/2017  . Bacteremia 11/29/2017  . 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 Obesity 11/26/2017     Sigurd Sos, PT 05/24/19 11:47 AM  [facility] [address] [place], [place], [address] Phone: [phone]   Fax:  [phone]  Name: Rebecca Porter MRN: [id number] Date of Birth: Jun 07, 1940

## 2019-05-29 ENCOUNTER — Ambulatory Visit: Payer: Medicare Other

## 2019-05-31 ENCOUNTER — Ambulatory Visit: Payer: Medicare Other

## 2019-05-31 ENCOUNTER — Other Ambulatory Visit: Payer: Self-pay

## 2019-05-31 DIAGNOSIS — M6281 Muscle weakness (generalized): Secondary | ICD-10-CM | POA: Diagnosis not present

## 2019-05-31 DIAGNOSIS — M545 Low back pain, unspecified: Secondary | ICD-10-CM

## 2019-05-31 NOTE — Therapy (Signed)
[facility] [address] [place], [place], [address] Phone: [phone]   Fax:  [phone]  Physical Therapy Treatment  Patient Details  Name: Rebecca Porter MRN: [id number] Date of Birth: [birth year] Referring Provider (PT): London Pepper, MD   Encounter Date: 05/31/2019  PT End of Session - 05/31/19 1148    Visit Number  13    Date for PT Re-Evaluation  07/12/19    Authorization Type  Medicare    PT Start Time  1059    PT Stop Time  1148    PT Time Calculation (min)  49 min    Activity Tolerance  Patient tolerated treatment well    Behavior During Therapy  [facility] for tasks assessed/performed       Past Medical History:  Diagnosis Date  . Cancer [facility])    breast cancer 2002   . Dyspnea    with exertion   . GERD (gastroesophageal reflux disease)   . History of kidney stones   . Personal history of radiation therapy 2002  . Sepsis ([place])    due to kidney stone blocking the flow per pt. family  10-2017  IV [facility]    Past Surgical History:  Procedure Laterality Date  . BREAST BIOPSY Left    malignant stereo   . BREAST LUMPECTOMY Left 2002  . CHOLECYSTECTOMY     1997  . CYSTOSCOPY     Left stone removal 12-31-17  . CYSTOSCOPY/URETEROSCOPY/HOLMIUM LASER/STENT PLACEMENT Left 01/10/2018   Procedure: CYSTOSCOPY, URETEROSCOPY,RETROGRADE PYLEGRAM,BASKET STONE EXTRACTION, AND  STENT REMOVAL;  Surgeon: Kathie Rhodes, MD;  Location: [facility];  Service: Urology;  Laterality: Left;  . IR LUMBAR DISC ASPIRATION W/IMG GUIDE  11/29/2017   abscess on spine  between L5 and S1   On IV Invanz   . RENAL ARTERY STENT     left November 07 2017    There were no vitals filed for this visit.  Subjective Assessment - 05/31/19 1105    Subjective  I am supposed to get a cortizone injection tomorrow in my Rt knee. My back pain has resolved.  I have been having intermittent spasms in my back but I think it is due to the weather.    Pain  Score  0-No pain         [facility] PT Assessment - 05/31/19 0001      Assessment   Medical Diagnosis  back pain    Referring Provider (PT)  London Pepper, MD    Onset Date/Surgical Date  03/27/19      Cognition   Overall Cognitive Status  Within Functional Limits for tasks assessed      Observation/Other Assessments   Focus on Therapeutic Outcomes (FOTO)   37% limitation      Posture/Postural Control   Posture/Postural Control  Postural limitations    Postural Limitations  Rounded Shoulders;Forward head;Flexed trunk      Strength   Right Hip Flexion  4/5    Left Hip Flexion  4+/5    Right Knee Flexion  4+/5    Right Knee Extension  5/5    Left Knee Flexion  5/5    Left Knee Extension  5/5      Transfers   Transfers  Independent with all Transfers      Ambulation/Gait   Ambulation/Gait  Yes    Ambulation/Gait Assistance  6: Modified independent (Device/Increase time)    Gait Pattern  Step-through pattern      Balance  Balance Assessed  Yes      Standardized Balance Assessment   Standardized Balance Assessment  Berg Balance Test      Berg Balance Test   Sit to Stand  Able to stand without using hands and stabilize independently    Standing Unsupported  Able to stand safely 2 minutes    Sitting with Back Unsupported but Feet Supported on Floor or Stool  Able to sit safely and securely 2 minutes    Stand to Sit  Controls descent by using hands    Transfers  Able to transfer safely, minor use of hands    Standing Unsupported with Eyes Closed  Able to stand 10 seconds safely    Standing Unsupported with Feet Together  Able to place feet together independently and stand 1 minute safely    From Standing, Reach Forward with Outstretched Arm  Can reach forward >12 cm safely (5")    From Standing Position, Pick up Object from Floor  Able to pick up shoe, needs supervision    From Standing Position, Turn to Look Behind Over each Shoulder  Looks behind one side only/other side  shows less weight shift    Turn 360 Degrees  Able to turn 360 degrees safely one side only in 4 seconds or less    Standing Unsupported, Alternately Place Feet on Step/Stool  Able to complete 4 steps without aid or supervision    Standing Unsupported, One Foot in Front  Able to plae foot ahead of the other independently and hold 30 seconds    Standing on One Leg  Able to lift leg independently and hold equal to or more than 3 seconds    Total Score  46    Berg comment:  moderate risk of falling                   [facility] Adult PT Treatment/Exercise - 05/31/19 0001      Lumbar Exercises: Aerobic   Nustep  Level 5 x 10 minutes at begining of session   PT present to monitor      Lumbar Exercises: Standing   Heel Raises  20 reps    Heel Raises Limitations  on mini tramp      Knee/Hip Exercises: Standing   SLS  5x 10 seconds Rt and Lt     Rebounder  weight shifting 3 ways x 1 minute each    Walking with Sports Cord  25# forward and reverse x10, sidestepping x 5 each 15#    Other Standing Knee Exercises  tandem stance on balance pad: 2x20 seconds each leg               PT Short Term Goals - 05/08/19 1109      PT SHORT TERM GOAL #2   Title  report no sleep interruptions due to LBP    Baseline  sleep is not interrupted by pain    Status  Achieved        PT Long Term Goals - 05/31/19 1112      PT LONG TERM GOAL #1   Title  be independent in advanced HEP    Time  6    Period  Weeks    Status  On-going    Target Date  07/12/19      PT LONG TERM GOAL #2   Title  reduce FOTO to < or = to 38% limitation    Baseline  37%    Status  Achieved  PT LONG TERM GOAL #3   Title  verbalize and demonstrate body mechanics modifications for lumbar protection with home tasks    Status  Achieved      PT LONG TERM GOAL #4   Title  improve core and LE strength to perform cooking tasks with 75% less fatigue    Baseline  50% improvement    Time  6    Period  Weeks     Status  Revised    Target Date  07/12/19      PT LONG TERM GOAL #5   Title  improve Berg Balance score to > or = to 52/56 to reduce risk of falling    Time  6    Period  Weeks    Status  New    Target Date  07/12/19            Plan - 05/31/19 1138    Clinical Impression Statement  Pt is making steady progress toward goals.  Pt reports that her LBP has resolved and her main challenges at this time are balance and endurance for functional tasks.  Endurance for standing and walking has improved by 50% since the start of care.  Berg Balance test today was 46/56 indicating a moderate falls risk.  Pt will continue to benefit from skilled PT to address balance, strength and endurance to improve function and safety at home and in the community.    PT Frequency  2x / week    PT Duration  6 weeks    PT Treatment/Interventions  ADLs/Self Care Home Management;Cryotherapy;Electrical Stimulation;Moist Heat;Traction;Neuromuscular re-education;Balance training;Therapeutic exercise;Therapeutic activities;Stair training;Patient/family education;Manual techniques;Dry needling;Passive range of motion;Taping    PT Next Visit Plan  address balance, strength and endurance    PT Home Exercise Plan  [email or web address]    Consulted and Agree with Plan of Care  Patient       Patient will benefit from skilled therapeutic intervention in order to improve the following deficits and impairments:  Decreased activity tolerance, Decreased strength, Postural dysfunction, Improper body mechanics, Impaired flexibility, Pain, Decreased endurance, Increased muscle spasms, Difficulty walking, Decreased range of motion  Visit Diagnosis: Muscle weakness (generalized) - Plan: PT plan of care cert/re-cert  Acute bilateral low back pain without sciatica - Plan: PT plan of care cert/re-cert     Problem List Patient Active Problem List   Diagnosis Date Noted  . Osteomyelitis ([place]) 11/29/2017  . Discitis of lumbosacral region  11/29/2017  . Acute kidney failure ([place]) 11/29/2017  . Multiple drug allergies 11/29/2017  . Depression 11/29/2017  . UTI due to extended-spectrum beta lactamase (ESBL) producing Escherichia coli 11/29/2017  . Bacteremia 11/29/2017  . 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 Obesity 11/26/2017    Sigurd Sos, PT 05/31/19 11:49 AM  [facility] [address] [place], [place], [address] Phone: [phone]   Fax:  [phone]  Name: KYLEIGHA STUMP MRN: [id number] Date of Birth: [birth year]

## 2019-06-05 ENCOUNTER — Ambulatory Visit: Payer: Medicare Other

## 2019-06-05 ENCOUNTER — Other Ambulatory Visit: Payer: Self-pay

## 2019-06-05 DIAGNOSIS — M6281 Muscle weakness (generalized): Secondary | ICD-10-CM | POA: Diagnosis not present

## 2019-06-05 DIAGNOSIS — M545 Low back pain, unspecified: Secondary | ICD-10-CM

## 2019-06-05 NOTE — Therapy (Signed)
[facility] [address], [place], [place], [address] Phone: [phone]   Fax:  [phone]  Physical Therapy Treatment  Patient Details  Name: Rebecca Porter MRN: [id number] Date of Birth: [birth year] Referring Provider (PT): London Pepper, MD   Encounter Date: 06/05/2019  PT End of Session - 06/05/19 1145    Visit Number  14    Date for PT Re-Evaluation  07/12/19    Authorization Type  Medicare    PT Start Time  1057    PT Stop Time  1144    PT Time Calculation (min)  47 min    Activity Tolerance  Patient tolerated treatment well    Behavior During Therapy  [facility] for tasks assessed/performed       Past Medical History:  Diagnosis Date  . Cancer [facility])    breast cancer 2002   . Dyspnea    with exertion   . GERD (gastroesophageal reflux disease)   . History of kidney stones   . Personal history of radiation therapy 2002  . Sepsis ([place])    due to kidney stone blocking the flow per pt. family  10-2017  IV [facility]    Past Surgical History:  Procedure Laterality Date  . BREAST BIOPSY Left    malignant stereo   . BREAST LUMPECTOMY Left 2002  . CHOLECYSTECTOMY     1997  . CYSTOSCOPY     Left stone removal 12-31-17  . CYSTOSCOPY/URETEROSCOPY/HOLMIUM LASER/STENT PLACEMENT Left 01/10/2018   Procedure: CYSTOSCOPY, URETEROSCOPY,RETROGRADE PYLEGRAM,BASKET STONE EXTRACTION, AND  STENT REMOVAL;  Surgeon: Kathie Rhodes, MD;  Location: [facility];  Service: Urology;  Laterality: Left;  . IR LUMBAR DISC ASPIRATION W/IMG GUIDE  11/29/2017   abscess on spine  between L5 and S1   On IV Invanz   . RENAL ARTERY STENT     left November 07 2017    There were no vitals filed for this visit.  Subjective Assessment - 06/05/19 1110    Subjective  My cortizone injection was rescheduled last week due to weather.    Diagnostic tests  MRI 2019: lumbar spondylolisthesis, osteomyelitis, pars defect    Currently in Pain?  No/denies                        [facility] Adult PT Treatment/Exercise - 06/05/19 0001      Lumbar Exercises: Aerobic   Nustep  Level 5 x 10 minutes at begining of session   PT present to monitor      Lumbar Exercises: Standing   Heel Raises  20 reps    Heel Raises Limitations  on mini tramp      Knee/Hip Exercises: Standing   Forward Step Up Limitations  alternating step taps: 2x20 without UE support    Rocker Board  3 minutes    Rebounder  weight shifting 3 ways x 1 minute each    Walking with Sports Cord  30# forward and reverse x10,  25# sidestepping x 7 each    Other Standing Knee Exercises  tandem stance on balance pad: 2x20 seconds each leg      Knee/Hip Exercises: Seated   Sit to Sand  2 sets;5 reps;without UE support               PT Short Term Goals - 05/08/19 1109      PT SHORT TERM GOAL #2   Title  report no sleep interruptions due to LBP  Baseline  sleep is not interrupted by pain    Status  Achieved        PT Long Term Goals - 05/31/19 1112      PT LONG TERM GOAL #1   Title  be independent in advanced HEP    Time  6    Period  Weeks    Status  On-going    Target Date  07/12/19      PT LONG TERM GOAL #2   Title  reduce FOTO to < or = to 38% limitation    Baseline  37%    Status  Achieved      PT LONG TERM GOAL #3   Title  verbalize and demonstrate body mechanics modifications for lumbar protection with home tasks    Status  Achieved      PT LONG TERM GOAL #4   Title  improve core and LE strength to perform cooking tasks with 75% less fatigue    Baseline  50% improvement    Time  6    Period  Weeks    Status  Revised    Target Date  07/12/19      PT LONG TERM GOAL #5   Title  improve Berg Balance score to > or = to 52/56 to reduce risk of falling    Time  6    Period  Weeks    Status  New    Target Date  07/12/19            Plan - 06/05/19 1116    Clinical Impression Statement  Pt is making steady progress toward goals.   Endurance for standing and walking has improved by 50% since the start of care.  Berg Balance test last week was 46/56 indicating a moderate falls risk. Pt was able to participate in higher level balance tasks today with supervision for safety.  Pt will continue to benefit from skilled PT to address balance, strength and endurance to improve function and safety at home and in the community.    PT Frequency  2x / week    PT Duration  6 weeks    PT Treatment/Interventions  ADLs/Self Care Home Management;Cryotherapy;Electrical Stimulation;Moist Heat;Traction;Neuromuscular re-education;Balance training;Therapeutic exercise;Therapeutic activities;Stair training;Patient/family education;Manual techniques;Dry needling;Passive range of motion;Taping    PT Next Visit Plan  balance, endurance, proprioception    PT Home Exercise Plan  [email or web address]    Consulted and Agree with Plan of Care  Patient       Patient will benefit from skilled therapeutic intervention in order to improve the following deficits and impairments:  Decreased activity tolerance, Decreased strength, Postural dysfunction, Improper body mechanics, Impaired flexibility, Pain, Decreased endurance, Increased muscle spasms, Difficulty walking, Decreased range of motion  Visit Diagnosis: Muscle weakness (generalized)  Acute bilateral low back pain without sciatica     Problem List Patient Active Problem List   Diagnosis Date Noted  . Osteomyelitis ([place]) 11/29/2017  . 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 Acute kidney failure ([place]) 11/29/2017  . Multiple drug allergies 11/29/2017  . Depression 11/29/2017  . UTI due to extended-spectrum beta lactamase (ESBL) producing Escherichia coli 11/29/2017  . Bacteremia 11/29/2017  . 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 Obesity 11/26/2017    Sigurd Sos, PT 06/05/19 11:48 AM  [place] [facility] [address], [place], [place],  [address] Phone: [phone]   Fax:  [phone]  Name: Rebecca Porter MRN: [id number] Date of Birth: 1940-11-13

## 2019-06-07 ENCOUNTER — Other Ambulatory Visit: Payer: Self-pay

## 2019-06-07 ENCOUNTER — Ambulatory Visit: Payer: Medicare Other

## 2019-06-07 DIAGNOSIS — M6281 Muscle weakness (generalized): Secondary | ICD-10-CM | POA: Diagnosis not present

## 2019-06-07 DIAGNOSIS — M545 Low back pain, unspecified: Secondary | ICD-10-CM

## 2019-06-07 NOTE — Therapy (Signed)
[facility] [address] [place], [place], [address] Phone: [phone]   Fax:  [phone]  Physical Therapy Treatment  Patient Details  Name: Rebecca Porter MRN: [id number] Date of Birth: Apr 03, 1941 Referring Provider (PT): London Pepper, MD   Encounter Date: 06/07/2019  PT End of Session - 06/07/19 1141    Visit Number  15    Date for PT Re-Evaluation  07/12/19    Authorization Type  Medicare-KX modifier    PT Start Time  1109    PT Stop Time  1155    PT Time Calculation (min)  46 min    Activity Tolerance  Patient tolerated treatment well    Behavior During Therapy  [facility] for tasks assessed/performed       Past Medical History:  Diagnosis Date  . Cancer [facility])    breast cancer 2002   . Dyspnea    with exertion   . GERD (gastroesophageal reflux disease)   . History of kidney stones   . Personal history of radiation therapy 2002  . Sepsis ([place])    due to kidney stone blocking the flow per pt. family  10-2017  IV [facility]    Past Surgical History:  Procedure Laterality Date  . BREAST BIOPSY Left    malignant stereo   . BREAST LUMPECTOMY Left 2002  . CHOLECYSTECTOMY     1997  . CYSTOSCOPY     Left stone removal 12-31-17  . CYSTOSCOPY/URETEROSCOPY/HOLMIUM LASER/STENT PLACEMENT Left 01/10/2018   Procedure: CYSTOSCOPY, URETEROSCOPY,RETROGRADE PYLEGRAM,BASKET STONE EXTRACTION, AND  STENT REMOVAL;  Surgeon: Kathie Rhodes, MD;  [facility];  Service: Urology;  Laterality: Left;  . IR LUMBAR DISC ASPIRATION W/IMG GUIDE  11/29/2017   abscess on spine  between L5 and S1   On IV Invanz   . RENAL ARTERY STENT     left November 07 2017    There were no vitals filed for this visit.  Subjective Assessment - 06/07/19 1111    Subjective  I felt good after last session.    Currently in Pain?  Yes    Pain Score  5     Pain Location  Knee    Pain Orientation  Right    Pain Descriptors / Indicators  Aching    Pain  Type  Acute pain    Pain Onset  1 to 4 weeks ago    Pain Frequency  Intermittent    Aggravating Factors   standing    Pain Relieving Factors  CBD oil, rest                       [facility] Adult PT Treatment/Exercise - 06/07/19 0001      High Level Balance   High Level Balance Comments  sidestepping: 4x25 ft with cueing for neutral hip.  weaving in/out of dark blocks on the floor 4x25 feet      Lumbar Exercises: Aerobic   Nustep  Level 5 x 10 minutes at end of session   PT present to monitor      Knee/Hip Exercises: Standing   Forward Step Up Limitations  alternating step taps: 2x20 without UE support    Rocker Board  3 minutes    Rebounder  weight shifting 3 ways x 1 minute each    Walking with Sports Cord  30# forward and reverse x10,  25# sidestepping x 7 each    Other Standing Knee Exercises  tandem stance on  balance pad: 2x20 seconds each leg               PT Short Term Goals - 05/08/19 1109      PT SHORT TERM GOAL #2   Title  report no sleep interruptions due to LBP    Baseline  sleep is not interrupted by pain    Status  Achieved        PT Long Term Goals - 05/31/19 1112      PT LONG TERM GOAL #1   Title  be independent in advanced HEP    Time  6    Period  Weeks    Status  On-going    Target Date  07/12/19      PT LONG TERM GOAL #2   Title  reduce FOTO to < or = to 38% limitation    Baseline  37%    Status  Achieved      PT LONG TERM GOAL #3   Title  verbalize and demonstrate body mechanics modifications for lumbar protection with home tasks    Status  Achieved      PT LONG TERM GOAL #4   Title  improve core and LE strength to perform cooking tasks with 75% less fatigue    Baseline  50% improvement    Time  6    Period  Weeks    Status  Revised    Target Date  07/12/19      PT LONG TERM GOAL #5   Title  improve Berg Balance score to > or = to 52/56 to reduce risk of falling    Time  6    Period  Weeks    Status  New     Target Date  07/12/19            Plan - 06/07/19 1118    Clinical Impression Statement  Pt is making steady progress toward goals.  Endurance for standing and walking has improved by 50% since the start of care.   Pt was able to participate in higher level balance tasks this with supervision for safety.   Pt is able to perform balance exercises with minimal  to no UE support today and participated in change of direction and tandem line walk without significant deviation. Pt will continue to benefit from skilled PT to address balance, strength and endurance to improve function and safety at home and in the community.    PT Treatment/Interventions  ADLs/Self Care Home Management;Cryotherapy;Electrical Stimulation;Moist Heat;Traction;Neuromuscular re-education;Balance training;Therapeutic exercise;Therapeutic activities;Stair training;Patient/family education;Manual techniques;Dry needling;Passive range of motion;Taping    PT Next Visit Plan  balance, endurance, proprioception    PT Home Exercise Plan  [id number]    Consulted and Agree with Plan of Care  Patient       Patient will benefit from skilled therapeutic intervention in order to improve the following deficits and impairments:  Decreased activity tolerance, Decreased strength, Postural dysfunction, Improper body mechanics, Impaired flexibility, Pain, Decreased endurance, Increased muscle spasms, Difficulty walking, Decreased range of motion  Visit Diagnosis: Acute bilateral low back pain without sciatica  Muscle weakness (generalized)     Problem List Patient Active Problem List   Diagnosis Date Noted  . Osteomyelitis ([place]) 11/29/2017  . 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 Acute kidney failure ([place]) 11/29/2017  . Multiple drug allergies 11/29/2017  . Depression 11/29/2017  . UTI due to extended-spectrum beta lactamase (ESBL) producing Escherichia coli 11/29/2017  . Bacteremia 11/29/2017  . Epidural  abscess 11/26/2017   . Obesity 11/26/2017     Sigurd Sos, PT 06/07/19 11:46 AM  [place] [address] [place], [place], [address] Phone: [phone]   Fax:  [phone]  Name: MEMPHIS MENDEN MRN: [id number] Date of Birth: 16-Feb-1941

## 2019-06-12 ENCOUNTER — Ambulatory Visit: Payer: Medicare Other | Attending: Family Medicine

## 2019-06-12 ENCOUNTER — Other Ambulatory Visit: Payer: Self-pay

## 2019-06-12 DIAGNOSIS — M6281 Muscle weakness (generalized): Secondary | ICD-10-CM | POA: Insufficient documentation

## 2019-06-12 DIAGNOSIS — M545 Low back pain, unspecified: Secondary | ICD-10-CM

## 2019-06-12 NOTE — Therapy (Signed)
[facility] [address], [place], [place], [address] Phone: [phone]   Fax:  [phone]  Physical Therapy Treatment  Patient Details  Name: Rebecca Porter MRN: [id number] Date of Birth: 07/31/1940 Referring Provider (PT): London Pepper, MD   Encounter Date: 06/12/2019  PT End of Session - 06/12/19 1233    Visit Number  16    Date for PT Re-Evaluation  07/12/19    Authorization Type  Medicare-KX modifier    PT Start Time  1144    PT Stop Time  1230    PT Time Calculation (min)  46 min    Activity Tolerance  Patient tolerated treatment well    Behavior During Therapy  [facility] for tasks assessed/performed       Past Medical History:  Diagnosis Date  . Cancer [facility])    breast cancer 2002   . Dyspnea    with exertion   . GERD (gastroesophageal reflux disease)   . History of kidney stones   . Personal history of radiation therapy 2002  . Sepsis ([place])    due to kidney stone blocking the flow per pt. family  10-2017  IV [facility]    Past Surgical History:  Procedure Laterality Date  . BREAST BIOPSY Left    malignant stereo   . BREAST LUMPECTOMY Left 2002  . CHOLECYSTECTOMY     1997  . CYSTOSCOPY     Left stone removal 12-31-17  . CYSTOSCOPY/URETEROSCOPY/HOLMIUM LASER/STENT PLACEMENT Left 01/10/2018   Procedure: CYSTOSCOPY, URETEROSCOPY,RETROGRADE PYLEGRAM,BASKET STONE EXTRACTION, AND  STENT REMOVAL;  Surgeon: Kathie Rhodes, MD;  Location: [facility];  Service: Urology;  Laterality: Left;  . IR LUMBAR DISC ASPIRATION W/IMG GUIDE  11/29/2017   abscess on spine  between L5 and S1   On IV Invanz   . RENAL ARTERY STENT     left November 07 2017    There were no vitals filed for this visit.                    [facility] Adult PT Treatment/Exercise - 06/12/19 0001      High Level Balance   High Level Balance Comments  --      Lumbar Exercises: Aerobic   Nustep  Level 5 x 10 minutes   PT present to monitor       Knee/Hip Exercises: Standing   Forward Step Up Limitations  alternating step taps: 2x20 without UE support    Rocker Board  3 minutes    Rebounder  weight shifting 3 ways x 1 minute each    Walking with Sports Cord  30# forward and reverse x10,  25# sidestepping x 7 each    Other Standing Knee Exercises  tandem stance on mini tramp: 2x20 seconds each leg               PT Short Term Goals - 05/08/19 1109      PT SHORT TERM GOAL #2   Title  report no sleep interruptions due to LBP    Baseline  sleep is not interrupted by pain    Status  Achieved        PT Long Term Goals - 05/31/19 1112      PT LONG TERM GOAL #1   Title  be independent in advanced HEP    Time  6    Period  Weeks    Status  On-going    Target Date  07/12/19  PT LONG TERM GOAL #2   Title  reduce FOTO to < or = to 38% limitation    Baseline  37%    Status  Achieved      PT LONG TERM GOAL #3   Title  verbalize and demonstrate body mechanics modifications for lumbar protection with home tasks    Status  Achieved      PT LONG TERM GOAL #4   Title  improve core and LE strength to perform cooking tasks with 75% less fatigue    Baseline  50% improvement    Time  6    Period  Weeks    Status  Revised    Target Date  07/12/19      PT LONG TERM GOAL #5   Title  improve Berg Balance score to > or = to 52/56 to reduce risk of falling    Time  6    Period  Weeks    Status  New    Target Date  07/12/19            Plan - 06/12/19 1213    Clinical Impression Statement  Pt reports that her balance is overall and does not need to use hands to grabs walls/objects in the house.  Pt is able to independently get legs out of the car without UE support.  Pt was able to perform balance tasks on the mini tramp today and alternating step-taps on 8" step without UE support.  Pt requires close supervision for safety with balance tasks.  Pt will continue to benefit from skilled PT to address balance,  strength and endurance to improve safety at home and in the community.    PT Frequency  2x / week    PT Duration  6 weeks    PT Treatment/Interventions  ADLs/Self Care Home Management;Cryotherapy;Electrical Stimulation;Moist Heat;Traction;Neuromuscular re-education;Balance training;Therapeutic exercise;Therapeutic activities;Stair training;Patient/family education;Manual techniques;Dry needling;Passive range of motion;Taping    PT Next Visit Plan  balance, endurance, proprioception    PT Home Exercise Plan  [id number]    Consulted and Agree with Plan of Care  Patient       Patient will benefit from skilled therapeutic intervention in order to improve the following deficits and impairments:  Decreased activity tolerance, Decreased strength, Postural dysfunction, Improper body mechanics, Impaired flexibility, Pain, Decreased endurance, Increased muscle spasms, Difficulty walking, Decreased range of motion  Visit Diagnosis: Acute bilateral low back pain without sciatica  Muscle weakness (generalized)     Problem List Patient Active Problem List   Diagnosis Date Noted  . Osteomyelitis ([place]) 11/29/2017  . 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 Acute kidney failure ([place]) 11/29/2017  . Multiple drug allergies 11/29/2017  . Depression 11/29/2017  . UTI due to extended-spectrum beta lactamase (ESBL) producing Escherichia coli 11/29/2017  . Bacteremia 11/29/2017  . 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 Obesity 11/26/2017   Sigurd Sos, PT 06/12/19 12:35 PM  [facility] [address] [place], [place], [address] Phone: [phone]   Fax:  [phone]  Name: Rebecca Porter MRN: [id number] Date of Birth: 01-14-1941

## 2019-06-14 ENCOUNTER — Other Ambulatory Visit: Payer: Self-pay

## 2019-06-14 ENCOUNTER — Ambulatory Visit: Payer: Medicare Other

## 2019-06-14 DIAGNOSIS — M6281 Muscle weakness (generalized): Secondary | ICD-10-CM

## 2019-06-14 DIAGNOSIS — M545 Low back pain, unspecified: Secondary | ICD-10-CM

## 2019-06-14 NOTE — Therapy (Signed)
[facility] [address], [place], [place], [address] Phone: [phone]   Fax:  [phone]  Physical Therapy Treatment  Patient Details  Name: Rebecca Porter MRN: [id number] Date of Birth: 02/03/1941 Referring Provider (PT): London Pepper, MD   Encounter Date: 06/14/2019  PT End of Session - 06/14/19 1137    Visit Number  17    Date for PT Re-Evaluation  07/12/19    Authorization Type  Medicare-KX modifier    PT Start Time  1107    PT Stop Time  1149    PT Time Calculation (min)  42 min    Activity Tolerance  Patient tolerated treatment well    Behavior During Therapy  [facility] for tasks assessed/performed       Past Medical History:  Diagnosis Date  . Cancer [facility])    breast cancer 2002   . Dyspnea    with exertion   . GERD (gastroesophageal reflux disease)   . History of kidney stones   . Personal history of radiation therapy 2002  . Sepsis ([place])    due to kidney stone blocking the flow per pt. family  10-2017  IV [facility]    Past Surgical History:  Procedure Laterality Date  . BREAST BIOPSY Left    malignant stereo   . BREAST LUMPECTOMY Left 2002  . CHOLECYSTECTOMY     1997  . CYSTOSCOPY     Left stone removal 12-31-17  . CYSTOSCOPY/URETEROSCOPY/HOLMIUM LASER/STENT PLACEMENT Left 01/10/2018   Procedure: CYSTOSCOPY, URETEROSCOPY,RETROGRADE PYLEGRAM,BASKET STONE EXTRACTION, AND  STENT REMOVAL;  Surgeon: Kathie Rhodes, MD;  Location: [facility];  Service: Urology;  Laterality: Left;  . IR LUMBAR DISC ASPIRATION W/IMG GUIDE  11/29/2017   abscess on spine  between L5 and S1   On IV Invanz   . RENAL ARTERY STENT     left November 07 2017    There were no vitals filed for this visit.  Subjective Assessment - 06/14/19 1108    Subjective  My clock in my den was not right so I am a little late.    Currently in Pain?  Yes    Pain Score  5     Pain Location  Knee                       [facility] Adult PT  Treatment/Exercise - 06/14/19 0001      Lumbar Exercises: Aerobic   Nustep  Level 5 x 10 minutes   PT present to monitor      Knee/Hip Exercises: Standing   Forward Step Up Limitations  alternating step taps: 2x20 without UE support    Rocker Board  3 minutes    Rebounder  weight shifting 3 ways x 1 minute each (holding yellow ball)    Walking with Sports Cord  30# forward and reverse x10,  25# sidestepping x 7 each    Other Standing Knee Exercises  tandem stance on mini tramp: 2x20 seconds each leg               PT Short Term Goals - 05/08/19 1109      PT SHORT TERM GOAL #2   Title  report no sleep interruptions due to LBP    Baseline  sleep is not interrupted by pain    Status  Achieved        PT Long Term Goals - 06/14/19 1112      PT LONG TERM  GOAL #3   Title  verbalize and demonstrate body mechanics modifications for lumbar protection with home tasks    Status  Achieved            Plan - 06/14/19 1120    Clinical Impression Statement  Pt reports that her balance is overall and does not need to use hands to grabs walls/objects in the house.  Pt is able to independently get legs out of the car without UE support.  Pt was able to add weight to mini-tramp with weight shifting. Pt requires close supervision for safety with balance tasks.  Pt will continue to benefit from skilled PT to address balance, strength and endurance to improve safety at home and in the community    Rehab Potential  Good    PT Frequency  2x / week    PT Duration  6 weeks    PT Treatment/Interventions  ADLs/Self Care Home Management;Cryotherapy;Electrical Stimulation;Moist Heat;Traction;Neuromuscular re-education;Balance training;Therapeutic exercise;Therapeutic activities;Stair training;Patient/family education;Manual techniques;Dry needling;Passive range of motion;Taping    PT Next Visit Plan  balance, endurance, proprioception    PT Home Exercise Plan  [id number]    Consulted and Agree  with Plan of Care  Patient       Patient will benefit from skilled therapeutic intervention in order to improve the following deficits and impairments:  Decreased activity tolerance, Decreased strength, Postural dysfunction, Improper body mechanics, Impaired flexibility, Pain, Decreased endurance, Increased muscle spasms, Difficulty walking, Decreased range of motion  Visit Diagnosis: Muscle weakness (generalized)  Acute bilateral low back pain without sciatica     Problem List Patient Active Problem List   Diagnosis Date Noted  . Osteomyelitis ([place]) 11/29/2017  . 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 Acute kidney failure ([place]) 11/29/2017  . Multiple drug allergies 11/29/2017  . Depression 11/29/2017  . UTI due to extended-spectrum beta lactamase (ESBL) producing Escherichia coli 11/29/2017  . Bacteremia 11/29/2017  . 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 Obesity 11/26/2017     Sigurd Sos, PT 06/14/19 11:40 AM  [facility] [address] [place], [place], [address] Phone: [phone]   Fax:  [phone]  Name: Rebecca Porter MRN: [id number] Date of Birth: 1940-06-24

## 2019-06-16 DIAGNOSIS — M1711 Unilateral primary osteoarthritis, right knee: Secondary | ICD-10-CM | POA: Diagnosis not present

## 2019-06-20 DIAGNOSIS — Z0000 Encounter for general adult medical examination without abnormal findings: Secondary | ICD-10-CM | POA: Diagnosis not present

## 2019-06-20 DIAGNOSIS — M549 Dorsalgia, unspecified: Secondary | ICD-10-CM | POA: Diagnosis not present

## 2019-06-20 DIAGNOSIS — R030 Elevated blood-pressure reading, without diagnosis of hypertension: Secondary | ICD-10-CM | POA: Diagnosis not present

## 2019-06-20 DIAGNOSIS — R350 Frequency of micturition: Secondary | ICD-10-CM | POA: Diagnosis not present

## 2019-06-20 DIAGNOSIS — E785 Hyperlipidemia, unspecified: Secondary | ICD-10-CM | POA: Diagnosis not present

## 2019-06-21 ENCOUNTER — Other Ambulatory Visit: Payer: Self-pay

## 2019-06-21 ENCOUNTER — Ambulatory Visit: Payer: Medicare Other

## 2019-06-21 DIAGNOSIS — M6281 Muscle weakness (generalized): Secondary | ICD-10-CM | POA: Diagnosis not present

## 2019-06-21 DIAGNOSIS — M545 Low back pain, unspecified: Secondary | ICD-10-CM

## 2019-06-21 NOTE — Therapy (Addendum)
[facility] [address], [place], [place], [address] Phone: [phone]   Fax:  [phone]  Physical Therapy Treatment  Patient Details  Name: Rebecca Porter MRN: [id number] Date of Birth: 02/26/1941 Referring Provider (PT): London Pepper, MD   Encounter Date: 06/21/2019  PT End of Session - 06/21/19 1138    Visit Number  18    Date for PT Re-Evaluation  07/12/19    Authorization Type  Medicare-KX modifier    PT Start Time  1058    PT Stop Time  1142    PT Time Calculation (min)  44 min    Activity Tolerance  Patient tolerated treatment well    Behavior During Therapy  [facility] for tasks assessed/performed       Past Medical History:  Diagnosis Date  . Cancer [facility])    breast cancer 2002   . Dyspnea    with exertion   . GERD (gastroesophageal reflux disease)   . History of kidney stones   . Personal history of radiation therapy 2002  . Sepsis ([place])    due to kidney stone blocking the flow per pt. family  10-2017  IV [facility]    Past Surgical History:  Procedure Laterality Date  . BREAST BIOPSY Left    malignant stereo   . BREAST LUMPECTOMY Left 2002  . CHOLECYSTECTOMY     1997  . CYSTOSCOPY     Left stone removal 12-31-17  . CYSTOSCOPY/URETEROSCOPY/HOLMIUM LASER/STENT PLACEMENT Left 01/10/2018   Procedure: CYSTOSCOPY, URETEROSCOPY,RETROGRADE PYLEGRAM,BASKET STONE EXTRACTION, AND  STENT REMOVAL;  Surgeon: Kathie Rhodes, MD;  [facility]: [facility];  Service: Urology;  Laterality: Left;  . IR LUMBAR DISC ASPIRATION W/IMG GUIDE  11/29/2017   abscess on spine  between L5 and S1   On IV Invanz   . RENAL ARTERY STENT     left November 07 2017    There were no vitals filed for this visit.  Subjective Assessment - 06/21/19 1109    Subjective  I'm doing  well.  I got the injection into my knee and it didn't help much.    Diagnostic tests  MRI 2019: lumbar spondylolisthesis, osteomyelitis, pars defect    Currently in Pain?   No/denies                       [facility] Adult PT Treatment/Exercise - 06/21/19 0001      Lumbar Exercises: Aerobic   UBE (Upper Arm Bike)  Level 1.5 x 6 minutes (3/3) for endurance gains    Nustep  Level 5 x 10 minutes   PT present to monitor      Knee/Hip Exercises: Standing   Forward Step Up Limitations  alternating step taps: 2x20 without UE support   standing on balance pad   Rocker Board  3 minutes    Rebounder  weight shifting 3 ways x 1 minute each (holding yellow ball)    Walking with Sports Cord  30# forward and reverse x10,  25# sidestepping x 7 each    Other Standing Knee Exercises  tandem stance on mini tramp: 2x20 seconds each leg               PT Short Term Goals - 05/08/19 1109      PT SHORT TERM GOAL #2   Title  report no sleep interruptions due to LBP    Baseline  sleep is not interrupted by pain    Status  Achieved        PT Long Term Goals - 06/14/19 1112      PT LONG TERM GOAL #3   Title  verbalize and demonstrate body mechanics modifications for lumbar protection with home tasks    Status  Achieved            Plan - 06/21/19 1126    Clinical Impression Statement  I am really doing well.  I no longer need to use my hands to touch furniture when I walk.  My stride is longer.  Pt is able to perform tandem stance on the trampoline x 30 seconds without loss of balance or instability.  Pt is able to progress exercises today to with addition of balance pad with step-taps.  Pt requires stand by assistance for safety.  Pt will continue to benefit from skilled PT to address balance and endurance.    PT Treatment/Interventions  ADLs/Self Care Home Management;Cryotherapy;Electrical Stimulation;Moist Heat;Traction;Neuromuscular re-education;Balance training;Therapeutic exercise;Therapeutic activities;Stair training;Patient/family education;Manual techniques;Dry needling;Passive range of motion;Taping    PT Next Visit Plan  1 more session-  Berg Balance test, finalize HEP    PT Home Exercise Plan  [id number]    Consulted and Agree with Plan of Care  Patient       Patient will benefit from skilled therapeutic intervention in order to improve the following deficits and impairments:  Decreased activity tolerance, Decreased strength, Postural dysfunction, Improper body mechanics, Impaired flexibility, Pain, Decreased endurance, Increased muscle spasms, Difficulty walking, Decreased range of motion  Visit Diagnosis: Acute bilateral low back pain without sciatica  Muscle weakness (generalized)     Problem List Patient Active Problem List   Diagnosis Date Noted  . Osteomyelitis ([place]) 11/29/2017  . 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 Acute kidney failure ([place]) 11/29/2017  . Multiple drug allergies 11/29/2017  . Depression 11/29/2017  . UTI due to extended-spectrum beta lactamase (ESBL) producing Escherichia coli 11/29/2017  . Bacteremia 11/29/2017  . 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 Obesity 11/26/2017     Sigurd Sos, PT 06/21/19 11:39 AM PHYSICAL THERAPY DISCHARGE SUMMARY  Visits from Start of Care: 18  Current functional level related to goals / functional outcomes: Pt called to cancel her final PT appointment.  Pt has made excellent gains and will D/C to HEP.     Remaining deficits: See above.     Education / Equipment: HEP Plan: Patient agrees to discharge.  Patient goals were partially met. Patient is being discharged due to being pleased with the current functional level.  ?????        Sigurd Sos, PT 06/26/19 2:23 PM  [facility] [address] [place], [place], [address] Phone: [phone]   Fax:  [phone]  Name: Rebecca Porter MRN: [id number] Date of Birth: [birth year]

## 2019-06-22 DIAGNOSIS — L570 Actinic keratosis: Secondary | ICD-10-CM | POA: Diagnosis not present

## 2019-06-22 DIAGNOSIS — D225 Melanocytic nevi of trunk: Secondary | ICD-10-CM | POA: Diagnosis not present

## 2019-06-22 DIAGNOSIS — L820 Inflamed seborrheic keratosis: Secondary | ICD-10-CM | POA: Diagnosis not present

## 2019-06-22 DIAGNOSIS — L738 Other specified follicular disorders: Secondary | ICD-10-CM | POA: Diagnosis not present

## 2019-06-22 DIAGNOSIS — L720 Epidermal cyst: Secondary | ICD-10-CM | POA: Diagnosis not present

## 2019-06-22 DIAGNOSIS — D171 Benign lipomatous neoplasm of skin and subcutaneous tissue of trunk: Secondary | ICD-10-CM | POA: Diagnosis not present

## 2019-06-22 DIAGNOSIS — L821 Other seborrheic keratosis: Secondary | ICD-10-CM | POA: Diagnosis not present

## 2019-06-24 ENCOUNTER — Ambulatory Visit: Payer: Medicare Other | Attending: Internal Medicine

## 2019-06-24 DIAGNOSIS — Z23 Encounter for immunization: Secondary | ICD-10-CM

## 2019-06-24 NOTE — Progress Notes (Signed)
   Covid-19 Vaccination Clinic  Name:  ARAM DEVINCENT    MRN: [id number] DOB: [birth year]  06/24/2019  Ms. Foshee was observed post Covid-19 immunization for 30 minutes based on pre-vaccination screening without incident. She was provided with Vaccine Information Sheet and instruction to access the V-Safe system.   Ms. Gruener was instructed to call 911 with any severe reactions post vaccine: Marland Kitchen Difficulty breathing  . Swelling of face and throat  . A fast heartbeat  . A bad rash all over body  . Dizziness and weakness   Immunizations Administered    Name Date Dose VIS Date Route   Pfizer COVID-19 Vaccine 06/24/2019  2:20 PM 0.3 mL 03/24/2019 Intramuscular   Manufacturer: [place]   Lot: [id number]   [place]: [id number]

## 2019-06-27 ENCOUNTER — Ambulatory Visit: Payer: Medicare Other

## 2019-07-18 ENCOUNTER — Ambulatory Visit: Payer: Medicare Other | Attending: Internal Medicine

## 2019-07-18 DIAGNOSIS — Z23 Encounter for immunization: Secondary | ICD-10-CM

## 2019-07-18 NOTE — Progress Notes (Signed)
   Covid-19 Vaccination Clinic  Name:  Rebecca Porter    MRN: [id number] DOB: 01/08/1941  07/18/2019  [name] was observed post Covid-19 immunization for 30 minutes based on pre-vaccination screening without incident. She was provided with Vaccine Information Sheet and instruction to access the V-Safe system.   [name] was instructed to call 911 with any severe reactions post vaccine: Marland Kitchen Difficulty breathing  . Swelling of face and throat  . A fast heartbeat  . A bad rash all over body  . Dizziness and weakness   Immunizations Administered    Name Date Dose VIS Date Route   Pfizer COVID-19 Vaccine 07/18/2019  3:20 PM 0.3 mL 03/24/2019 Intramuscular   Manufacturer: [place]   Lot: [id number]   [place]: [id number]

## 2019-07-21 DIAGNOSIS — N390 Urinary tract infection, site not specified: Secondary | ICD-10-CM | POA: Diagnosis not present

## 2019-07-21 DIAGNOSIS — R682 Dry mouth, unspecified: Secondary | ICD-10-CM | POA: Diagnosis not present

## 2019-07-21 DIAGNOSIS — R350 Frequency of micturition: Secondary | ICD-10-CM | POA: Diagnosis not present

## 2019-07-21 DIAGNOSIS — B370 Candidal stomatitis: Secondary | ICD-10-CM | POA: Diagnosis not present

## 2019-09-06 DIAGNOSIS — H353132 Nonexudative age-related macular degeneration, bilateral, intermediate dry stage: Secondary | ICD-10-CM | POA: Diagnosis not present

## 2019-09-06 DIAGNOSIS — H52203 Unspecified astigmatism, bilateral: Secondary | ICD-10-CM | POA: Diagnosis not present

## 2019-09-06 DIAGNOSIS — H524 Presbyopia: Secondary | ICD-10-CM | POA: Diagnosis not present

## 2019-09-06 DIAGNOSIS — H5213 Myopia, bilateral: Secondary | ICD-10-CM | POA: Diagnosis not present

## 2019-09-06 DIAGNOSIS — H35351 Cystoid macular degeneration, right eye: Secondary | ICD-10-CM | POA: Diagnosis not present

## 2019-09-07 DIAGNOSIS — B370 Candidal stomatitis: Secondary | ICD-10-CM | POA: Diagnosis not present

## 2019-09-07 DIAGNOSIS — N390 Urinary tract infection, site not specified: Secondary | ICD-10-CM | POA: Diagnosis not present

## 2019-09-07 DIAGNOSIS — R300 Dysuria: Secondary | ICD-10-CM | POA: Diagnosis not present

## 2019-09-27 ENCOUNTER — Other Ambulatory Visit: Payer: Self-pay

## 2019-09-27 ENCOUNTER — Ambulatory Visit (INDEPENDENT_AMBULATORY_CARE_PROVIDER_SITE_OTHER): Payer: Medicare Other | Admitting: Ophthalmology

## 2019-09-27 ENCOUNTER — Encounter (INDEPENDENT_AMBULATORY_CARE_PROVIDER_SITE_OTHER): Payer: Self-pay | Admitting: Ophthalmology

## 2019-09-27 DIAGNOSIS — H3581 Retinal edema: Secondary | ICD-10-CM | POA: Diagnosis not present

## 2019-09-27 DIAGNOSIS — H47391 Other disorders of optic disc, right eye: Secondary | ICD-10-CM | POA: Diagnosis not present

## 2019-09-27 DIAGNOSIS — H353132 Nonexudative age-related macular degeneration, bilateral, intermediate dry stage: Secondary | ICD-10-CM | POA: Insufficient documentation

## 2019-09-27 NOTE — Assessment & Plan Note (Signed)

## 2019-09-27 NOTE — Progress Notes (Signed)
09/27/2019     CHIEF COMPLAINT Patient presents for Blurred Vision   HISTORY OF PRESENT ILLNESS: Rebecca Porter is a 79 y.o. female who presents to the clinic today for:   HPI    Blurred Vision    In both eyes.  Onset was sudden.  Vision is blurred.  Severity is moderate.  This started 1 month ago.  Occurring constantly.  It is worse throughout the day and when reading.  Context:  near vision.  Since onset it is stable.  Treatments tried include no treatments.  Response to treatment was no improvement.  I, the attending physician,  performed the HPI with the patient and updated documentation appropriately.          Comments    Pt referred by Dr. Wyatt Portela for CME OD.  This region was nasal to the foveal avascular zone and seem to be emanating from the nerve suggesting a possible optic pit Pt noticed about 1 month ago it became very difficult to read small print. Denies FOL and floaters.        Last edited by Hurman Horn, MD on 09/27/2019  3:16 PM. (History)      Referring physician: Debbra Riding, MD [address] [place],  [place] [address]  HISTORICAL INFORMATION:   Selected notes from the MEDICAL RECORD NUMBER       CURRENT MEDICATIONS: No current outpatient medications on file. (Ophthalmic Drugs)   No current facility-administered medications for this visit. (Ophthalmic Drugs)   Current Outpatient Medications (Other)  Medication Sig  . hydrochlorothiazide (MICROZIDE) 12.5 MG capsule Take 1 capsule (12.5 mg total) by mouth daily as needed.  . metoprolol succinate (TOPROL-XL) 25 MG 24 hr tablet Take 1 tablet by mouth once daily  . ondansetron (ZOFRAN-ODT) 4 MG disintegrating tablet Take 4 mg by mouth 3 (three) times daily as needed.  Marland Kitchen oxybutynin (DITROPAN XL) 15 MG 24 hr tablet Take 15 mg by mouth daily.  . traMADol (ULTRAM) 50 MG tablet Take 50 mg by mouth daily as needed.   No current facility-administered medications for this visit. (Other)       REVIEW OF SYSTEMS:    ALLERGIES Allergies  Allergen Reactions  . Ciprofloxacin Hcl Swelling and Rash    Throat swelling  . Penicillins Anaphylaxis and Hives    Has patient had a PCN reaction causing immediate rash, facial/tongue/throat swelling, SOB or lightheadedness with hypotension: Yes Has patient had a PCN reaction causing severe rash involving mucus membranes or skin necrosis: No Has patient had a PCN reaction that required hospitalization: unknown Has patient had a PCN reaction occurring within the last 10 years: No If all of the above answers are "NO", then may proceed with Cephalosporin use.  Santiago Bur [Nitrofurantoin Macrocrystal] Other (See Comments)    Unknown reaction  . Protonix [Pantoprazole Sodium] Other (See Comments)    Unknown reaction    PAST MEDICAL HISTORY Past Medical History:  Diagnosis Date  . Cancer [facility]    breast cancer 2002   . Dyspnea    with exertion   . GERD (gastroesophageal reflux disease)   . History of kidney stones   . Personal history of radiation therapy 2002  . Sepsis ([place])    due to kidney stone blocking the flow per pt. family  10-2017  IV [facility]   Past Surgical History:  Procedure Laterality Date  . BREAST BIOPSY Left    malignant stereo   . BREAST  LUMPECTOMY Left 2002  . CHOLECYSTECTOMY     1997  . CYSTOSCOPY     Left stone removal 12-31-17  . CYSTOSCOPY/URETEROSCOPY/HOLMIUM LASER/STENT PLACEMENT Left 01/10/2018   Procedure: CYSTOSCOPY, URETEROSCOPY,RETROGRADE PYLEGRAM,BASKET STONE EXTRACTION, AND  STENT REMOVAL;  Surgeon: Kathie Rhodes, MD;  Location: [facility];  Service: Urology;  Laterality: Left;  . IR LUMBAR DISC ASPIRATION W/IMG GUIDE  11/29/2017   abscess on spine  between L5 and S1   On IV Invanz   . RENAL ARTERY STENT     left November 07 2017    FAMILY HISTORY Family History  Problem Relation Age of Onset  . Breast cancer Neg Hx     SOCIAL HISTORY Social History   Tobacco Use  . Smoking status: Never  Smoker  . Smokeless tobacco: Never Used  Vaping Use  . Vaping Use: Never used  Substance Use Topics  . Alcohol use: Never  . Drug use: Never         OPHTHALMIC EXAM:  Base Eye Exam    Visual Acuity (Snellen - Linear)      Right Left   Dist cc 20/25 -2 20/25 -2       Tonometry (Tonopen, 1:35 PM)      Right Left   Pressure 11 11       Pupils      Pupils Dark Light Shape React APD   Right PERRL 4 3 Round Brisk None   Left PERRL 4 3 Round Brisk None       Visual Fields (Counting fingers)      Left Right    Full Full       Neuro/Psych    Oriented x3: Yes   Mood/Affect: Normal       Dilation    Both eyes: 1.0% Mydriacyl, 2.5% Phenylephrine @ 1:35 PM        Slit Lamp and Fundus Exam    External Exam      Right Left   External Normal Normal       Slit Lamp Exam      Right Left   Lids/Lashes Normal    Conjunctiva/Sclera White and quiet    Cornea Clear    Anterior Chamber Deep and quiet Deep and quiet   Iris Round and reactive    Lens Centered posterior chamber intraocular lens Centered posterior chamber intraocular lens   Anterior Vitreous Normal        Fundus Exam      Right Left   Posterior Vitreous Central vitreous floaters, Posterior vitreous detachment Normal   Disc Temporal slope to the optic nerve with possibly a small pit in the inferior margin of the cup with a small retinal artery emanating from this region.  This is more clearly seen upon my second clinical examination after confirmation of the absence of the CNVM by FFA.  Normal   C/D Ratio 0.6 0.55   Macula Soft drusen, no cystoid macular edema, Pigmented atrophy, Early age related macular degeneration, no hemorrhage, no macular thickening Early age related macular degeneration, Soft drusen, no exudates, no macular thickening, Retinal pigment epithelial mottling   Vessels Normal Normal   Periphery Normal Normal          IMAGING AND PROCEDURES  Imaging and Procedures for 09/27/19  OCT,  Retina - OU - Both Eyes       Right Eye Quality was good. Scan locations included subfoveal. Central Foveal Thickness: 263. Progression has no prior data. Findings include abnormal foveal  contour, retinal drusen , cystoid macular edema.   Left Eye Quality was good. Scan locations included subfoveal. Central Foveal Thickness: 265. Progression has no prior data. Findings include retinal drusen .   Notes Intraretinal fluid nasal to the foveal avascular zone, in the papillomacular bundle appears to emanate from the temporal peripapillary region OD       Color Fundus Photography Optos - OU - Both Eyes       Right Eye Progression has no prior data. Disc findings include increased cup to disc ratio, thinning of rim. Macula : drusen, edema, retinal pigment epithelium abnormalities. Vessels : normal observations.   Left Eye Progression has no prior data. Disc findings include normal observations. Macula : drusen, retinal pigment epithelium abnormalities. Vessels : normal observations.   Notes OD, there does appear to be on color fundus photography a small optic pit along the inferotemporal margin of the nerve adjacent to the inferotemporal retinal artery.  This appearance however cannot be confirmed clinical exam.  OU with intermediate age-related macular degeneration with large soft drusen and a para macular region.  Retinal vasculature is otherwise normal in each eye.       Fluorescein Angiography Optos (Transit OD)       Right Eye   Progression has no prior data. Early phase findings include window defect, staining. Mid/Late phase findings include staining. Choroidal neovascularization is not present.   Left Eye   Progression has no prior data. Mid/Late phase findings include staining. Choroidal neovascularization is not present.   Notes OD, no evidence of PERI papillary CNVM in the region of intraretinal macular edema nasal to the fovea OD.  Thus this is either a small optic  pit with secondary edema or possibly foveal macular schisis although the latter is not likely   OS no evidence of active CNVM                ASSESSMENT/PLAN:  Intermediate stage nonexudative age-related macular degeneration of both eyes The nature of age--related macular degeneration was discussed with the patient as well as the distinction between dry and wet types. Checking an Amsler Grid daily with advice to return immediately should a distortion develop, was given to the patient. The patient 's smoking status now and in the past was determined and advice based on the AREDS study was provided regarding the consumption of antioxidant supplements. AREDS 2 vitamin formulation was recommended. Consumption of dark leafy vegetables and fresh fruits of various colors was recommended. Treatment modalities for wet macular degeneration particularly the use of intravitreal injections of anti-blood vessel growth factors was discussed with the patient. Avastin, Lucentis, and Eylea are the available options. On occasion, therapy includes the use of photodynamic therapy and thermal laser. Stressed to the patient do not rub eyes. All patient questions were answered.      ICD-10-CM   1. Intermediate stage nonexudative age-related macular degeneration of both eyes  H35.3132 Color Fundus Photography Optos - OU - Both Eyes    Fluorescein Angiography Optos (Transit OD)  2. Retinal edema of right eye  H35.81 OCT, Retina - OU - Both Eyes    Fluorescein Angiography Optos (Transit OD)  3. 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  This appearance however cannot be confirmed clinical exam.  OU with intermediate age-related macular degeneration with large soft drusen  and a para macular region.  Retinal vasculature is otherwise normal in each eye.  2.  I  recommend simple observation the patient will follow up with an OCT in 3 months.  3.  I explained in the absence of definable visual impact, observation is certainly the appropriate therapy. Ophthalmic Meds Ordered this visit:  No orders of the defined types were placed in this encounter.      Return in about 2 months (around 11/27/2019) for DILATE OU, OCT.  There are no Patient Instructions on file for this visit.   Explained the diagnoses, plan, and follow up with the patient and they expressed understanding.  Patient expressed understanding of the importance of proper follow up care.   Clent Demark Meshia Rau M.D. Diseases & Surgery of the Retina and Vitreous Retina & Diabetic [place] 09/27/19     Abbreviations: M myopia (nearsighted); A astigmatism; H hyperopia (farsighted); P presbyopia; Mrx spectacle prescription;  CTL contact lenses; OD right eye; OS left eye; OU both eyes  XT exotropia; ET esotropia; PEK punctate epithelial keratitis; PEE punctate epithelial erosions; DES dry eye syndrome; MGD meibomian gland dysfunction; ATs artificial tears; PFAT's preservative free artificial tears; [place] nuclear sclerotic cataract; PSC posterior subcapsular cataract; ERM epi-retinal membrane; PVD posterior vitreous detachment; RD retinal detachment; DM diabetes mellitus; DR diabetic retinopathy; NPDR non-proliferative diabetic retinopathy; PDR proliferative diabetic retinopathy; CSME clinically significant macular edema; DME diabetic macular edema; dbh dot blot hemorrhages; CWS cotton wool spot; POAG primary open angle glaucoma; C/D cup-to-disc ratio; HVF humphrey visual field; GVF goldmann visual field; OCT optical coherence tomography; IOP intraocular pressure; BRVO Branch retinal vein occlusion; CRVO central retinal vein occlusion; CRAO central retinal artery occlusion; BRAO branch retinal artery occlusion; RT retinal tear; SB scleral buckle; PPV pars plana vitrectomy; VH Vitreous hemorrhage; PRP  panretinal laser photocoagulation; IVK intravitreal kenalog; VMT vitreomacular traction; MH Macular hole;  NVD neovascularization of the disc; NVE neovascularization elsewhere; AREDS age related eye disease study; ARMD age related macular degeneration; POAG primary open angle glaucoma; EBMD epithelial/anterior basement membrane dystrophy; ACIOL anterior chamber intraocular lens; IOL intraocular lens; PCIOL posterior chamber intraocular lens; Phaco/IOL phacoemulsification with intraocular lens placement; [place] photorefractive keratectomy; LASIK laser assisted in situ keratomileusis; HTN hypertension; DM diabetes mellitus; COPD chronic obstructive pulmonary disease

## 2019-09-29 DIAGNOSIS — N390 Urinary tract infection, site not specified: Secondary | ICD-10-CM | POA: Diagnosis not present

## 2019-09-29 DIAGNOSIS — N201 Calculus of ureter: Secondary | ICD-10-CM | POA: Diagnosis not present

## 2019-10-04 ENCOUNTER — Ambulatory Visit
Admission: RE | Admit: 2019-10-04 | Discharge: 2019-10-04 | Disposition: A | Discharge status: Home / Self Care | Payer: Medicare Other | Source: Ambulatory Visit | Attending: Family Medicine | Admitting: Family Medicine

## 2019-10-04 ENCOUNTER — Other Ambulatory Visit: Payer: Self-pay

## 2019-10-04 DIAGNOSIS — N6320 Unspecified lump in the left breast, unspecified quadrant: Secondary | ICD-10-CM

## 2019-10-04 DIAGNOSIS — R928 Other abnormal and inconclusive findings on diagnostic imaging of breast: Secondary | ICD-10-CM

## 2019-10-05 DIAGNOSIS — R310 Gross hematuria: Secondary | ICD-10-CM | POA: Diagnosis not present

## 2019-10-13 DIAGNOSIS — N390 Urinary tract infection, site not specified: Secondary | ICD-10-CM | POA: Diagnosis not present

## 2019-10-25 DIAGNOSIS — N390 Urinary tract infection, site not specified: Secondary | ICD-10-CM | POA: Diagnosis not present

## 2019-10-25 DIAGNOSIS — R3915 Urgency of urination: Secondary | ICD-10-CM | POA: Diagnosis not present

## 2019-10-26 DIAGNOSIS — N390 Urinary tract infection, site not specified: Secondary | ICD-10-CM | POA: Diagnosis not present

## 2019-11-27 ENCOUNTER — Ambulatory Visit (INDEPENDENT_AMBULATORY_CARE_PROVIDER_SITE_OTHER): Payer: Medicare Other | Admitting: Ophthalmology

## 2019-11-27 ENCOUNTER — Other Ambulatory Visit: Payer: Self-pay

## 2019-11-27 ENCOUNTER — Encounter (INDEPENDENT_AMBULATORY_CARE_PROVIDER_SITE_OTHER): Payer: Self-pay | Admitting: Ophthalmology

## 2019-11-27 DIAGNOSIS — H3581 Retinal edema: Secondary | ICD-10-CM | POA: Diagnosis not present

## 2019-11-27 DIAGNOSIS — H47391 Other disorders of optic disc, right eye: Secondary | ICD-10-CM | POA: Diagnosis not present

## 2019-11-27 DIAGNOSIS — H353132 Nonexudative age-related macular degeneration, bilateral, intermediate dry stage: Secondary | ICD-10-CM | POA: Diagnosis not present

## 2019-11-27 NOTE — Assessment & Plan Note (Signed)
No active leakage OD today

## 2019-11-27 NOTE — Assessment & Plan Note (Signed)
Stable condition OD,

## 2019-11-27 NOTE — Assessment & Plan Note (Signed)

## 2019-11-27 NOTE — Progress Notes (Signed)
11/27/2019     CHIEF COMPLAINT Patient presents for Retina Follow Up   HISTORY OF PRESENT ILLNESS: Rebecca Porter is a 79 y.o. female who presents to the clinic today for:   HPI    Retina Follow Up    Patient presents with  Dry AMD.  In both eyes.  Severity is moderate.  Duration of 2 months.  Since onset it is stable.  I, the attending physician,  performed the HPI with the patient and updated documentation appropriately.          Comments    2 Month AMD f\u OU. OCT  Pt states no changes in vision. Pt states she has had a sticky discharge from OU.       Last edited by [name] on 11/27/2019  1:54 PM. (History)      Referring physician: London Pepper, MD [address] [place],   [id number]  HISTORICAL INFORMATION:   Selected notes from the [facility]: No current outpatient medications on file. (Ophthalmic Drugs)   No current facility-administered medications for this visit. (Ophthalmic Drugs)   Current Outpatient Medications (Other)  Medication Sig  . hydrochlorothiazide (MICROZIDE) 12.5 MG capsule Take 1 capsule (12.5 mg total) by mouth daily as needed.  . metoprolol succinate (TOPROL-XL) 25 MG 24 hr tablet Take 1 tablet by mouth once daily  . ondansetron (ZOFRAN-ODT) 4 MG disintegrating tablet Take 4 mg by mouth 3 (three) times daily as needed.  Marland Kitchen oxybutynin (DITROPAN XL) 15 MG 24 hr tablet Take 15 mg by mouth daily.  . traMADol (ULTRAM) 50 MG tablet Take 50 mg by mouth daily as needed.   No current facility-administered medications for this visit. (Other)      REVIEW OF SYSTEMS:    ALLERGIES Allergies  Allergen Reactions  . Ciprofloxacin Hcl Swelling and Rash    Throat swelling  . Penicillins Anaphylaxis and Hives    Has patient had a PCN reaction causing immediate rash, facial/tongue/throat swelling, SOB or lightheadedness with hypotension: Yes Has patient had a PCN reaction  causing severe rash involving mucus membranes or skin necrosis: No Has patient had a PCN reaction that required hospitalization: unknown Has patient had a PCN reaction occurring within the last 10 years: No If all of the above answers are "NO", then may proceed with Cephalosporin use.  Santiago Bur [Nitrofurantoin Macrocrystal] Other (See Comments)    Unknown reaction  . Protonix [Pantoprazole Sodium] Other (See Comments)    Unknown reaction    PAST MEDICAL HISTORY Past Medical History:  Diagnosis Date  . Cancer [facility])    breast cancer 2002   . Dyspnea    with exertion   . GERD (gastroesophageal reflux disease)   . History of kidney stones   . Personal history of radiation therapy 2002  . Sepsis ([place])    due to kidney stone blocking the flow per pt. family  10-2017  IV [facility]   Past Surgical History:  Procedure Laterality Date  . BREAST BIOPSY Left    malignant stereo   . BREAST LUMPECTOMY Left 2002  . CHOLECYSTECTOMY     1997  . CYSTOSCOPY     Left stone removal 12-31-17  . CYSTOSCOPY/URETEROSCOPY/HOLMIUM LASER/STENT PLACEMENT Left 01/10/2018   Procedure: CYSTOSCOPY, URETEROSCOPY,RETROGRADE PYLEGRAM,BASKET STONE EXTRACTION, AND  STENT REMOVAL;  Surgeon: Kathie Rhodes, MD;  Location: [facility];  Service: Urology;  Laterality: Left;  . IR LUMBAR DISC  ASPIRATION W/IMG GUIDE  11/29/2017   abscess on spine  between L5 and S1   On IV Invanz   . RENAL ARTERY STENT     left November 07 2017    FAMILY HISTORY Family History  Problem Relation Age of Onset  . Breast cancer Neg Hx     SOCIAL HISTORY Social History   Tobacco Use  . Smoking status: Never Smoker  . Smokeless tobacco: Never Used  Vaping Use  . Vaping Use: Never used  Substance Use Topics  . Alcohol use: Never  . Drug use: Never         OPHTHALMIC EXAM:  Base Eye Exam    Visual Acuity (Snellen - Linear)      Right Left   Dist cc 20/30 + 20/30 -2   Correction: Glasses       Tonometry (Tonopen, 2:01 PM)       Right Left   Pressure 16 16       Pupils      Pupils Dark Light Shape React APD   Right PERRL 4 3 Round Brisk None   Left PERRL 4 3 Round Brisk None       Visual Fields (Counting fingers)      Left Right    Full Full       Neuro/Psych    Oriented x3: Yes   Mood/Affect: Normal       Dilation    Both eyes: 1.0% Mydriacyl, 2.5% Phenylephrine @ 2:01 PM        Slit Lamp and Fundus Exam    External Exam      Right Left   External Normal Normal       Slit Lamp Exam      Right Left   Lids/Lashes Normal    Conjunctiva/Sclera White and quiet    Cornea Clear    Anterior Chamber Deep and quiet Deep and quiet   Iris Round and reactive    Lens Centered posterior chamber intraocular lens Centered posterior chamber intraocular lens   Anterior Vitreous Normal        Fundus Exam      Right Left   Posterior Vitreous Central vitreous floaters, Posterior vitreous detachment Normal   Disc Temporal slope to the optic nerve with possibly a small pit in the inferior margin of the cup with a small retinal artery emanating from this region.  This is more clearly seen upon my second clinical examination after confirmation of the absence of the CNVM by FFA.  Normal   C/D Ratio 0.6 0.55   Macula Soft drusen, no cystoid macular edema, Pigmented atrophy, Early age related macular degeneration, no hemorrhage, no macular thickening Early age related macular degeneration, Soft drusen, no exudates, no macular thickening, Retinal pigment epithelial mottling   Vessels Normal Normal   Periphery Normal Normal          IMAGING AND PROCEDURES  Imaging and Procedures for 11/27/19  OCT, Retina - OU - Both Eyes       Right Eye Quality was good. Scan locations included subfoveal. Central Foveal Thickness: 260. Progression has been stable.   Left Eye Quality was good. Scan locations included subfoveal. Central Foveal Thickness: 260. Progression has been stable.   Notes Region nasal to the fovea  OD with middle layers of retinal schisis type of appearance, with no outer retinal edema and no intraretinal CME, there is been no progression or change over the last 2 months of eye exam nor  by OCT.  This is not a CNVM and no evidence of a pit                ASSESSMENT/PLAN:  Intermediate stage nonexudative age-related macular degeneration of both eyes The nature of age--related macular degeneration was discussed with the patient as well as the distinction between dry and wet types. Checking an Amsler Grid daily with advice to return immediately should a distortion develop, was given to the patient. The patient 's smoking status now and in the past was determined and advice based on the AREDS study was provided regarding the consumption of antioxidant supplements. AREDS 2 vitamin formulation was recommended. Consumption of dark leafy vegetables and fresh fruits of various colors was recommended. Treatment modalities for wet macular degeneration particularly the use of intravitreal injections of anti-blood vessel growth factors was discussed with the patient. Avastin, Lucentis, and Eylea are the available options. On occasion, therapy includes the use of photodynamic therapy and thermal laser. Stressed to the patient do not rub eyes.  Patient was advised to check Amsler Grid daily and return immediately if changes are noted. Instructions on using the grid were given to the patient. All patient questions were answered.  Retinal edema of right eye Stable condition OD,  Optic pit, right No active leakage OD today      ICD-10-CM   1. Intermediate stage nonexudative age-related macular degeneration of both eyes  H35.3132 OCT, Retina - OU - Both Eyes  2. Retinal edema of right eye  H35.81   3. 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  No signs of ARMD progression in either eye.  2.  Middle areas of retinal edema OD likely represent a prior active optic pit, not active today 3.  Ophthalmic Meds  Ordered this visit:  No orders of the defined types were placed in this encounter.      Return in about 4 months (around 03/28/2020) for DILATE OU, OCT.  There are no Patient Instructions on file for this visit.   Explained the diagnoses, plan, and follow up with the patient and they expressed understanding.  Patient expressed understanding of the importance of proper follow up care.   Clent Demark Ettel Albergo M.D. Diseases & Surgery of the Retina and Vitreous Retina & Diabetic [place] 11/27/19     Abbreviations: M myopia (nearsighted); A astigmatism; H hyperopia (farsighted); P presbyopia; Mrx spectacle prescription;  CTL contact lenses; OD right eye; OS left eye; OU both eyes  XT exotropia; ET esotropia; PEK punctate epithelial keratitis; PEE punctate epithelial erosions; DES dry eye syndrome; MGD meibomian gland dysfunction; ATs artificial tears; PFAT's preservative free artificial tears; [place] nuclear sclerotic cataract; PSC posterior subcapsular cataract; ERM epi-retinal membrane; PVD posterior vitreous detachment; RD retinal detachment; DM diabetes mellitus; DR diabetic retinopathy; NPDR non-proliferative diabetic retinopathy; PDR proliferative diabetic retinopathy; CSME clinically significant macular edema; DME diabetic macular edema; dbh dot blot hemorrhages; CWS cotton wool spot; POAG primary open angle glaucoma; C/D cup-to-disc ratio; HVF humphrey visual field; GVF goldmann visual field; OCT optical coherence tomography; IOP intraocular pressure; BRVO Branch retinal vein occlusion; CRVO central retinal vein occlusion; CRAO central retinal artery occlusion; BRAO branch retinal artery occlusion; RT retinal tear; SB scleral buckle; PPV pars plana vitrectomy; VH Vitreous hemorrhage; PRP panretinal laser photocoagulation; IVK intravitreal kenalog; VMT vitreomacular traction; MH Macular hole;  NVD neovascularization of the disc; NVE neovascularization elsewhere; AREDS age related eye disease study;  ARMD age related macular degeneration; POAG primary open angle  glaucoma; EBMD epithelial/anterior basement membrane dystrophy; ACIOL anterior chamber intraocular lens; IOL intraocular lens; PCIOL posterior chamber intraocular lens; Phaco/IOL phacoemulsification with intraocular lens placement; [place] photorefractive keratectomy; LASIK laser assisted in situ keratomileusis; HTN hypertension; DM diabetes mellitus; COPD chronic obstructive pulmonary disease

## 2019-12-07 DIAGNOSIS — R3915 Urgency of urination: Secondary | ICD-10-CM | POA: Diagnosis not present

## 2019-12-07 DIAGNOSIS — N390 Urinary tract infection, site not specified: Secondary | ICD-10-CM | POA: Diagnosis not present

## 2019-12-21 DIAGNOSIS — R5383 Other fatigue: Secondary | ICD-10-CM | POA: Diagnosis not present

## 2019-12-21 DIAGNOSIS — R030 Elevated blood-pressure reading, without diagnosis of hypertension: Secondary | ICD-10-CM | POA: Diagnosis not present

## 2019-12-21 DIAGNOSIS — R350 Frequency of micturition: Secondary | ICD-10-CM | POA: Diagnosis not present

## 2019-12-21 DIAGNOSIS — E785 Hyperlipidemia, unspecified: Secondary | ICD-10-CM | POA: Diagnosis not present

## 2019-12-27 DIAGNOSIS — M1711 Unilateral primary osteoarthritis, right knee: Secondary | ICD-10-CM | POA: Diagnosis not present

## 2020-02-07 DIAGNOSIS — R3915 Urgency of urination: Secondary | ICD-10-CM | POA: Diagnosis not present

## 2020-02-29 ENCOUNTER — Encounter (INDEPENDENT_AMBULATORY_CARE_PROVIDER_SITE_OTHER): Payer: Medicare Other | Admitting: Ophthalmology

## 2020-03-04 ENCOUNTER — Encounter: Payer: Self-pay | Admitting: Cardiovascular Disease

## 2020-03-04 ENCOUNTER — Ambulatory Visit (INDEPENDENT_AMBULATORY_CARE_PROVIDER_SITE_OTHER): Payer: Medicare Other | Admitting: Cardiovascular Disease

## 2020-03-04 ENCOUNTER — Other Ambulatory Visit: Payer: Self-pay

## 2020-03-04 DIAGNOSIS — R0600 Dyspnea, unspecified: Secondary | ICD-10-CM

## 2020-03-04 DIAGNOSIS — E7800 Pure hypercholesterolemia, unspecified: Secondary | ICD-10-CM

## 2020-03-04 DIAGNOSIS — I447 Left bundle-branch block, unspecified: Secondary | ICD-10-CM | POA: Diagnosis not present

## 2020-03-04 DIAGNOSIS — I5189 Other ill-defined heart diseases: Secondary | ICD-10-CM

## 2020-03-04 DIAGNOSIS — R0609 Other forms of dyspnea: Secondary | ICD-10-CM

## 2020-03-04 DIAGNOSIS — I10 Essential (primary) hypertension: Secondary | ICD-10-CM | POA: Diagnosis not present

## 2020-03-04 NOTE — Patient Instructions (Signed)
Medication Instructions:  No changes *If you need a refill on your cardiac medications before your next appointment, please call your pharmacy*   Lab Work: Not needed If you have labs (blood work) drawn today and your tests are completely normal, you will receive your results only by: Marland Kitchen MyChart Message (if you have MyChart) OR . A paper copy in the mail If you have any lab test that is abnormal or we need to change your treatment, we will call you to review the results.   Testing/Procedures: Not needed   Follow-Up: At [facility], you and your health needs are our priority.  As part of our continuing mission to provide you with exceptional heart care, we have created designated Provider Care Teams.  These Care Teams include your primary Cardiologist (physician) and Advanced Practice Providers (APPs -  Physician Assistants and Nurse Practitioners) who all work together to provide you with the care you need, when you need it.  We recommend signing up for the patient portal called "MyChart".  Sign up information is provided on this After Visit Summary.  MyChart is used to connect with patients for Virtual Visits (Telemedicine).  Patients are able to view lab/test results, encounter notes, upcoming appointments, etc.  Non-urgent messages can be sent to your provider as well.   To learn more about what you can do with MyChart, go to [email or web address].    Your next appointment:   12 month(s)  The format for your next appointment:   In Person  Provider:   Shelva Majestic, MD

## 2020-03-04 NOTE — Progress Notes (Signed)
Cardiology Office Note    Date:  03/07/2020   ID:  Rebecca Porter, DOB 11-30-1975, MRN [id number]  PCP:  London Pepper, MD  Cardiologist:  Shelva Majestic, MD   F/U cardiololgy evaluation   History of Present Illness:  Rebecca Porter is a 79 y.o. female who presents to the office today for a 14 month follow-up cardiology evaluation.  Rebecca Porter has a history of a left kidney stone leading to hospitalization in July 2019 with urosepsis secondary to E. coli sensitive to ertapenem.  She subsequently developed low back pain and was felt to have L4-L5 epidural abscess and discitis leading to her hospitalization at [facility] in August 2019.  She was evaluated by neurology and had a PICC line inserted for 6 weeks of antibiotic therapy.  She has noticed mild shortness of breath and progressive exertional dyspnea but denies any definitive episodes of chest pain or palpitations.  She is in need to undergo urologic surgery and audiology clearance was recommended prior to surgery due to new left bundle branch block.  Of note, the patient states that she was evaluated July and ECG at that time was done.  I was able to obtain the ECG report but not see the tracing and reportedly this was interpreted as sinus tachycardia with anteroseptal infarct, age undetermined, ST abnormality with possible lateral subendocardial injury and was felt to be abnormal without prior ECG available for comparison.  When I saw her for initial cardiology evaluation, she denied any episodes of chest pressure prior MI but admitted to mild progressive shortness of breath with exertion and had noticed some leg swelling.  Preoperative echo Doppler study and nuclear stress testing.  Her nuclear stress test revealed an EF of 46%.  There were no ECG changes.  There was a medium defect of moderate severity in the anteroseptal septal wall and it was felt most likely this was due to left bundle branch block attenuation artifact but anteroseptal  ischemia could not be completely excluded.  An echo Doppler study revealed an ejection fraction at 55% without wall motion abnormalities.  She had mild PA pressure elevation at 41 mm.  She was given clearance to undergo her urologic surgery which she was able to tolerate well without cardiovascular compromise.  She states she has lost 34 pounds over 4 months.  She did not tolerate lisinopril due to cough.  He denies recent chest pain or shortness of breath.  I  saw her in November 2019.  At that time her blood pressure was elevated and her resting pulse was 90.  I added Toprol-XL 25 mg to her medical regimen.    I last saw her in September 2020.  Over the prior year, her blood pressure at home typically was running in the 120s to 130s.  She had noticed some intermittent ankle swelling.  She had undergone laboratory by her primary physician Dr. London Pepper in February 2020.  Her total cholesterol was 222 and LDL cholesterol 130.  She was not started on any lipid-lowering therapy.  She has been on metoprolol 25 mg daily.  During that evaluation, her blood pressure remained elevated and with a history of some intermittent mild ankle swelling I gave her a prescription for HCTZ to take on an as needed basis for swelling and if her blood pressure remained in the 130 or above range.  Over the past year, Rebecca Porter has done well.  However she admits to a 25 pound weight gain since the  Covid pandemic began.  She did receive the Covid vaccine.  She continues to be followed by Dr. Orland Mustard who saw her in September 2021.  She states her blood pressure at home typically is less than 130/80.  She denies any significant swelling.  She presents for evaluation.  Past Medical History:  Diagnosis Date  . Cancer [facility])    breast cancer 2002   . Dyspnea    with exertion   . GERD (gastroesophageal reflux disease)   . History of kidney stones   . Personal history of radiation therapy 2002  . Sepsis ([place])    due to kidney  stone blocking the flow per pt. family  10-2017  IV [facility]    Past Surgical History:  Procedure Laterality Date  . BREAST BIOPSY Left    malignant stereo   . BREAST LUMPECTOMY Left 2002  . CHOLECYSTECTOMY     1997  . CYSTOSCOPY     Left stone removal 12-31-17  . CYSTOSCOPY/URETEROSCOPY/HOLMIUM LASER/STENT PLACEMENT Left 01/10/2018   Procedure: CYSTOSCOPY, URETEROSCOPY,RETROGRADE PYLEGRAM,BASKET STONE EXTRACTION, AND  STENT REMOVAL;  Surgeon: Kathie Rhodes, MD;  Location: [facility];  Service: Urology;  Laterality: Left;  . IR LUMBAR DISC ASPIRATION W/IMG GUIDE  11/29/2017   abscess on spine  between L5 and S1   On IV Invanz   . RENAL ARTERY STENT     left November 07 2017    Current Medications: Outpatient Medications Prior to Visit  Medication Sig Dispense Refill  . Artificial Saliva (BIOTENE MOISTURIZING MOUTH MT) Biotene Moisturizing Mouth mucosal spray  USE 1 SPRAY(S) IN MOUTH ONCE DAILY AS NEEDED FOR MOUTH THROAT    . atorvastatin (LIPITOR) 10 MG tablet atorvastatin 10 mg tablet  TAKE 1 TABLET BY MOUTH ONCE DAILY FOR 90 DAYS    . metoprolol succinate (TOPROL-XL) 25 MG 24 hr tablet Take 1 tablet by mouth once daily 90 tablet 3  . oxybutynin (DITROPAN XL) 15 MG 24 hr tablet Take 15 mg by mouth daily.    Marland Kitchen trimethoprim (TRIMPEX) 100 MG tablet Alternate 100 mg and 50 mg every other day.    . hydrochlorothiazide (MICROZIDE) 12.5 MG capsule Take 1 capsule (12.5 mg total) by mouth daily as needed. 30 capsule 12  . ondansetron (ZOFRAN-ODT) 4 MG disintegrating tablet Take 4 mg by mouth 3 (three) times daily as needed.  0  . traMADol (ULTRAM) 50 MG tablet Take 50 mg by mouth daily as needed.     No facility-administered medications prior to visit.     Allergies:   Bactrim [sulfamethoxazole-trimethoprim], Ciprofloxacin hcl, Penicillins, Macrobid [nitrofurantoin macrocrystal], and Protonix [pantoprazole sodium]   Social History   Socioeconomic History  . Marital status: Widowed    Spouse  name: Not on file  . Number of children: Not on file  . Years of education: Not on file  . Highest education level: Not on file  Occupational History  . Not on file  Tobacco Use  . Smoking status: Never Smoker  . Smokeless tobacco: Never Used  Vaping Use  . Vaping Use: Never used  Substance and Sexual Activity  . Alcohol use: Never  . Drug use: Never  . Sexual activity: Not Currently  Other Topics Concern  . Not on file  Social History Narrative  . Not on file   Social Determinants of Health   Financial Resource Strain:   . Difficulty of Paying Living Expenses: Not on file  Food Insecurity:   . Worried About Charity fundraiser  in the Last Year: Not on file  . Ran Out of Food in the Last Year: Not on file  Transportation Needs:   . Lack of Transportation (Medical): Not on file  . Lack of Transportation (Non-Medical): Not on file  Physical Activity:   . Days of Exercise per Week: Not on file  . Minutes of Exercise per Session: Not on file  Stress:   . Feeling of Stress : Not on file  Social Connections:   . Frequency of Communication with Friends and Family: Not on file  . Frequency of Social Gatherings with Friends and Family: Not on file  . Attends Religious Services: Not on file  . Active Member of Clubs or Organizations: Not on file  . Attends Archivist Meetings: Not on file  . Marital Status: Not on file    Social history is notable that she is widowed for 10 years.  She has 2 children, 4 grandchildren, and 2 great-grandchildren.  She is self-employed and is in [employer].  There is no tobacco history.  She does not exercise.  There is no alcohol use.  Family History:  The patient's family history is not on file.   Family history is notable for mother dying at age 8 with colon cancer.  Her father died at age 55 but has suffered a massive heart attack in 40.    ROS General: Negative; No fevers, chills, or night sweats;  HEENT: Negative; No  changes in vision or hearing, sinus congestion, difficulty swallowing Pulmonary: Negative; No cough, wheezing, shortness of breath, hemoptysis Cardiovascular: Positive for exertional dyspnea.  No chest pain or palpitations; no awareness of prior MI; positive for leg swelling GI: Negative; No nausea, vomiting, diarrhea, or abdominal pain GU: Positive for recent urosepsis and left kidney stone with subsequent stenting Musculoskeletal: Right knee discomfort which has limited her activity. Hematologic/Oncology: Negative; no easy bruising, bleeding Endocrine: Negative; no heat/cold intolerance; no diabetes Neuro: Sciatica symptoms from the spine abscess Skin: Negative; No rashes or skin lesions Psychiatric: Negative; No behavioral problems, depression Sleep: Negative; No snoring, daytime sleepiness, hypersomnolence, bruxism, restless legs, hypnogognic hallucinations, no cataplexy Other comprehensive 14 point system review is negative.   PHYSICAL EXAM:   VS:  BP 140/84   Pulse 64   Ht 5' 6.5" (1.689 m)   Wt 226 lb 6.4 oz (102.7 kg)   BMI 35.99 kg/m     Repeat blood pressure by me was 128/78  Wt Readings from Last 3 Encounters:  03/04/20 226 lb 6.4 oz (102.7 kg)  12/28/18 205 lb (93 kg)  02/11/18 204 lb 6.4 oz (92.7 kg)    General: Alert, oriented, no distress.  Skin: normal turgor, no rashes, warm and dry HEENT: Normocephalic, atraumatic. Pupils equal round and reactive to light; sclera anicteric; extraocular muscles intact;  Nose without nasal septal hypertrophy Mouth/Parynx benign; Mallinpatti scale 3 Neck: No JVD, no carotid bruits; normal carotid upstroke Lungs: clear to ausculatation and percussion; no wheezing or rales Chest wall: without tenderness to palpitation Heart: PMI not displaced, RRR, s1 s2 normal, 1/6 systolic murmur, no diastolic murmur, no rubs, gallops, thrills, or heaves Abdomen: soft, nontender; no hepatosplenomehaly, BS+; abdominal aorta nontender and not  dilated by palpation. Back: no CVA tenderness Pulses 2+ Musculoskeletal: full range of motion, normal strength, no joint deformities Extremities: Resolution of prior edema; no clubbing cyanosis or edema, Homan's sign negative  Neurologic: grossly nonfocal; Cranial nerves grossly wnl Psychologic: Normal mood and affect  Studies/Labs Reviewed:   EKG:  EKG is ordered today.  ECG (independently read by me): NSR at 64; LBBB; QTc 466 msec  September 2020 ECG (independently read by me): Normal sinus rhythm at 60 bpm, left bundle branch block, QTc interval 456 Rebecca  February 11, 2018 ECG (independently read by me): Normal sinus rhythm at 90 bpm.  Left bundle branch block with repolarization changes.  QTc interval 481 Rebecca.  December 28, 2017 ECG (independently read by me): Normal sinus rhythm at 88 bpm.  Left bundle branch block with repolarization changes.  Poor R wave progression V1 through V6.  December 22, 2017 ECG (independently reviewed by me): Normal sinus rhythm.  Left bundle branch block with repolarization changes.   Recent Labs: BMP Latest Ref Rng & Units 12/22/2017 11/27/2017 11/26/2017  Glucose 70 - 99 mg/dL 89 91 100(H)  BUN 8 - 23 mg/dL 22 24(H) 27(H)  Creatinine 0.44 - 1.00 mg/dL 0.85 0.77 0.77  Sodium 135 - 145 mmol/L 143 138 139  Potassium 3.5 - 5.1 mmol/L 3.7 4.1 4.3  Chloride 98 - 111 mmol/L 108 108 108  CO2 22 - 32 mmol/L $RemoveB'26 25 25  'PWVcWVLK$ Calcium 8.9 - 10.3 mg/dL 9.6 9.0 9.1     Hepatic Function Latest Ref Rng & Units 12/30/2017 11/26/2017  Total Protein 6.0 - 8.5 g/dL 6.4 6.0(L)  Albumin 3.5 - 4.8 g/dL 3.5 2.4(L)  AST 0 - 40 IU/L 20 16  ALT 0 - 32 IU/L 15 21  Alk Phosphatase 39 - 117 IU/L 98 145(H)  Total Bilirubin 0.0 - 1.2 mg/dL 0.4 0.6  Bilirubin, Direct 0.00 - 0.40 mg/dL 0.13 -    CBC Latest Ref Rng & Units 12/22/2017 11/27/2017 11/26/2017  WBC 4.0 - 10.5 K/uL 6.7 9.5 11.5(H)  Hemoglobin 12.0 - 15.0 g/dL 11.1(L) 9.5(L) 9.7(L)  Hematocrit 36 - 46 % 34.3(L) 29.4(L)  30.2(L)  Platelets 150 - 400 K/uL 222 259 237   Lab Results  Component Value Date   MCV 93.5 12/22/2017   MCV 93.9 11/27/2017   MCV 94.4 11/26/2017   Lab Results  Component Value Date   TSH 2.470 12/30/2017   No results found for: HGBA1C   BNP No results found for: BNP  ProBNP No results found for: PROBNP   Lipid Panel     Component Value Date/Time   CHOL 186 12/30/2017 1022   TRIG 71 12/30/2017 1022   HDL 65 12/30/2017 1022   CHOLHDL 2.9 12/30/2017 1022   LDLCALC 107 (H) 12/30/2017 1022     RADIOLOGY: No results found.   Additional studies/ records that were reviewed today include:  Reviewed the patient's hospital records and obtained interpretation analysis of a prior ECG.  Recent laboratory was reviewed.  ASSESSMENT:    1. Essential hypertension   2. LBBB (left bundle branch block)   3. Grade I diastolic dysfunction   4. Pure hypercholesterolemia   5. DOE (dyspnea on exertion)      PLAN:  Rebecca Porter is a 79 year old female who developed urosepsis from a left kidney stone which was culture positive for extended spectrum beta-lactamase (ESBL) E. coli requiring PICC line insertion and 6 weeks of antibiotic therapy.  She has multiple allergies and required ertapenem antibiotic therapy.  She denied any prior cardiac history.  Her ECG reported in July 2019 from [place] raise the possibility of age-indeterminate anteroseptal MI with possible lateral subendocardial injury.  Her ECG when I initially evaluated her revealed sinus rhythm with poor R wave progression  and left bundle branch block with repolarization changes.  She had experienced exertional dyspnea and increased shortness of breath as well as leg edema and subsequently underwent a nuclear perfusion study and echo Doppler assessment.  Her EF was normal on her echo study without significant valvular pathology and there was evidence for mild grade 1 diastolic dysfunction.  There was mild TR and mildly  increased PA pressure.  Her nuclear perfusion study was low risk which showed mild anteroseptal defect most likely due to her underlying left bundle branch block.  Presently, she is doing well and is asymptomatic.  She is now on metoprolol succinate 25 mg once a day.  She has a prescription for her previous HCTZ but has not required use.  She also has mild hyperlipidemia and is on atorvastatin 10 mg.  She is followed by Dr. Orland Mustard at [facility].  Laboratory from December 21, 2019 was reviewed which showed a total cholesterol 183, triglycerides 73, HDL was excellent at 74 and LDL was 95.  Vitamin D was 42.  Renal function was stable with a creatinine of 0.97.  LFTs were normal.  Hemoglobin and hematocrit were stable.  Clinically she is doing well.  She is tolerating low-dose atorvastatin and if LDL increases further titration to 20 mg can be considered.  She admits to a 25 pound weight gain since Covid.  I discussed the importance of weight loss and increased exercise.  BMI is increased at 36.  She will follow up with Dr. Orland Mustard.  I will see her in 1 year for reevaluation or sooner as needed.    Medication Adjustments/Labs and Tests Ordered: Current medicines are reviewed at length with the patient today.  Concerns regarding medicines are outlined above.  Medication changes, Labs and Tests ordered today are listed in the Patient Instructions below. Patient Instructions  Medication Instructions:  No changes *If you need a refill on your cardiac medications before your next appointment, please call your pharmacy*   Lab Work: Not needed If you have labs (blood work) drawn today and your tests are completely normal, you will receive your results only by: Marland Kitchen MyChart Message (if you have MyChart) OR . A paper copy in the mail If you have any lab test that is abnormal or we need to change your treatment, we will call you to review the results.   Testing/Procedures: Not needed   Follow-Up: At  [facility], you and your health needs are our priority.  As part of our continuing mission to provide you with exceptional heart care, we have created designated Provider Care Teams.  These Care Teams include your primary Cardiologist (physician) and Advanced Practice Providers (APPs -  Physician Assistants and Nurse Practitioners) who all work together to provide you with the care you need, when you need it.  We recommend signing up for the patient portal called "MyChart".  Sign up information is provided on this After Visit Summary.  MyChart is used to connect with patients for Virtual Visits (Telemedicine).  Patients are able to view lab/test results, encounter notes, upcoming appointments, etc.  Non-urgent messages can be sent to your provider as well.   To learn more about what you can do with MyChart, go to [email or web address].    Your next appointment:   12 month(s)  The format for your next appointment:   In Person  Provider:   Shelva Majestic, MD       Signed, Shelva Majestic, MD  03/07/2020 10:43 AM  [facility] [address] [place], [place], [place]  [address] Phone: [phone]

## 2020-03-07 ENCOUNTER — Encounter: Payer: Self-pay | Admitting: Cardiovascular Disease

## 2020-03-14 ENCOUNTER — Other Ambulatory Visit: Payer: Self-pay | Admitting: Cardiovascular Disease

## 2020-03-18 DIAGNOSIS — Z124 Encounter for screening for malignant neoplasm of cervix: Secondary | ICD-10-CM | POA: Diagnosis not present

## 2020-03-18 DIAGNOSIS — Z6837 Body mass index (BMI) 37.0-37.9, adult: Secondary | ICD-10-CM | POA: Diagnosis not present

## 2020-03-28 ENCOUNTER — Encounter (INDEPENDENT_AMBULATORY_CARE_PROVIDER_SITE_OTHER): Payer: Medicare Other | Admitting: Ophthalmology

## 2020-04-01 ENCOUNTER — Encounter (INDEPENDENT_AMBULATORY_CARE_PROVIDER_SITE_OTHER): Payer: Medicare Other | Admitting: Ophthalmology

## 2020-04-19 DIAGNOSIS — R1310 Dysphagia, unspecified: Secondary | ICD-10-CM | POA: Diagnosis not present

## 2020-04-19 DIAGNOSIS — R1013 Epigastric pain: Secondary | ICD-10-CM | POA: Diagnosis not present

## 2020-04-19 DIAGNOSIS — R194 Change in bowel habit: Secondary | ICD-10-CM | POA: Diagnosis not present

## 2020-04-19 DIAGNOSIS — Z800 Family history of malignant neoplasm of digestive organs: Secondary | ICD-10-CM | POA: Diagnosis not present

## 2020-05-09 DIAGNOSIS — Z01812 Encounter for preprocedural laboratory examination: Secondary | ICD-10-CM | POA: Diagnosis not present

## 2020-05-14 DIAGNOSIS — R1314 Dysphagia, pharyngoesophageal phase: Secondary | ICD-10-CM | POA: Diagnosis not present

## 2020-05-14 DIAGNOSIS — R1013 Epigastric pain: Secondary | ICD-10-CM | POA: Diagnosis not present

## 2020-05-14 DIAGNOSIS — K3189 Other diseases of stomach and duodenum: Secondary | ICD-10-CM | POA: Diagnosis not present

## 2020-05-14 DIAGNOSIS — Z800 Family history of malignant neoplasm of digestive organs: Secondary | ICD-10-CM | POA: Diagnosis not present

## 2020-05-14 DIAGNOSIS — K319 Disease of stomach and duodenum, unspecified: Secondary | ICD-10-CM | POA: Diagnosis not present

## 2020-05-17 DIAGNOSIS — K319 Disease of stomach and duodenum, unspecified: Secondary | ICD-10-CM | POA: Diagnosis not present

## 2020-05-27 DIAGNOSIS — Z800 Family history of malignant neoplasm of digestive organs: Secondary | ICD-10-CM | POA: Diagnosis not present

## 2020-05-27 DIAGNOSIS — K219 Gastro-esophageal reflux disease without esophagitis: Secondary | ICD-10-CM | POA: Diagnosis not present

## 2020-05-27 DIAGNOSIS — R1319 Other dysphagia: Secondary | ICD-10-CM | POA: Diagnosis not present

## 2020-06-24 DIAGNOSIS — D225 Melanocytic nevi of trunk: Secondary | ICD-10-CM | POA: Diagnosis not present

## 2020-06-24 DIAGNOSIS — D2271 Melanocytic nevi of right lower limb, including hip: Secondary | ICD-10-CM | POA: Diagnosis not present

## 2020-06-24 DIAGNOSIS — L820 Inflamed seborrheic keratosis: Secondary | ICD-10-CM | POA: Diagnosis not present

## 2020-06-24 DIAGNOSIS — L738 Other specified follicular disorders: Secondary | ICD-10-CM | POA: Diagnosis not present

## 2020-06-24 DIAGNOSIS — L720 Epidermal cyst: Secondary | ICD-10-CM | POA: Diagnosis not present

## 2020-06-24 DIAGNOSIS — L821 Other seborrheic keratosis: Secondary | ICD-10-CM | POA: Diagnosis not present

## 2020-06-24 DIAGNOSIS — D2272 Melanocytic nevi of left lower limb, including hip: Secondary | ICD-10-CM | POA: Diagnosis not present

## 2020-08-30 ENCOUNTER — Other Ambulatory Visit: Payer: Self-pay | Admitting: Obstetrics and Gynecology

## 2020-08-30 DIAGNOSIS — R030 Elevated blood-pressure reading, without diagnosis of hypertension: Secondary | ICD-10-CM | POA: Diagnosis not present

## 2020-08-30 DIAGNOSIS — L309 Dermatitis, unspecified: Secondary | ICD-10-CM | POA: Diagnosis not present

## 2020-08-30 DIAGNOSIS — E785 Hyperlipidemia, unspecified: Secondary | ICD-10-CM | POA: Diagnosis not present

## 2020-08-30 DIAGNOSIS — B351 Tinea unguium: Secondary | ICD-10-CM | POA: Diagnosis not present

## 2020-08-30 DIAGNOSIS — R410 Disorientation, unspecified: Secondary | ICD-10-CM | POA: Diagnosis not present

## 2020-08-30 DIAGNOSIS — E559 Vitamin D deficiency, unspecified: Secondary | ICD-10-CM | POA: Diagnosis not present

## 2020-08-30 DIAGNOSIS — Z0000 Encounter for general adult medical examination without abnormal findings: Secondary | ICD-10-CM | POA: Diagnosis not present

## 2020-08-30 DIAGNOSIS — R5383 Other fatigue: Secondary | ICD-10-CM | POA: Diagnosis not present

## 2020-09-02 DIAGNOSIS — N6320 Unspecified lump in the left breast, unspecified quadrant: Secondary | ICD-10-CM | POA: Diagnosis not present

## 2020-09-05 ENCOUNTER — Other Ambulatory Visit: Payer: Self-pay | Admitting: Obstetrics and Gynecology

## 2020-09-05 DIAGNOSIS — N63 Unspecified lump in unspecified breast: Secondary | ICD-10-CM

## 2020-09-17 ENCOUNTER — Encounter: Payer: Self-pay | Admitting: Neurology

## 2020-09-19 DIAGNOSIS — M170 Bilateral primary osteoarthritis of knee: Secondary | ICD-10-CM | POA: Diagnosis not present

## 2020-09-19 DIAGNOSIS — M1712 Unilateral primary osteoarthritis, left knee: Secondary | ICD-10-CM | POA: Diagnosis not present

## 2020-09-19 DIAGNOSIS — M1711 Unilateral primary osteoarthritis, right knee: Secondary | ICD-10-CM | POA: Diagnosis not present

## 2020-10-16 ENCOUNTER — Other Ambulatory Visit: Payer: Medicare Other

## 2020-10-31 DIAGNOSIS — M1711 Unilateral primary osteoarthritis, right knee: Secondary | ICD-10-CM | POA: Diagnosis not present

## 2020-11-01 ENCOUNTER — Ambulatory Visit
Admission: RE | Admit: 2020-11-01 | Discharge: 2020-11-01 | Disposition: A | Discharge status: Home / Self Care | Payer: Medicare Other | Source: Ambulatory Visit | Attending: Obstetrics and Gynecology | Admitting: Obstetrics and Gynecology

## 2020-11-01 ENCOUNTER — Other Ambulatory Visit: Payer: Self-pay

## 2020-11-01 DIAGNOSIS — R922 Inconclusive mammogram: Secondary | ICD-10-CM | POA: Diagnosis not present

## 2020-11-01 DIAGNOSIS — Z853 Personal history of malignant neoplasm of breast: Secondary | ICD-10-CM | POA: Diagnosis not present

## 2020-11-01 DIAGNOSIS — N63 Unspecified lump in unspecified breast: Secondary | ICD-10-CM

## 2020-11-07 DIAGNOSIS — M1711 Unilateral primary osteoarthritis, right knee: Secondary | ICD-10-CM | POA: Diagnosis not present

## 2020-11-14 DIAGNOSIS — M1711 Unilateral primary osteoarthritis, right knee: Secondary | ICD-10-CM | POA: Diagnosis not present

## 2020-12-05 NOTE — Progress Notes (Signed)
ri  NEUROLOGY CONSULTATION NOTE  ROZALEE Porter MRN: [id number] DOB: [birth year]  Referring provider: London Pepper, MD Primary care provider: London Pepper, MD  Reason for consult:  TIA  Assessment/Plan:   Transient ischemic attack Hypertension Hyperlipidemia  Start ASA '81mg'$  daily Continue atorvastatin.  Check lipid panel.  LDL goal less than 70. CT head Carotid ultrasound Echocardiogram 2 week cardiac event monitor Normotensive blood pressure Hgb A1c goal less than 7 Follow up 6 months.    Subjective:  Rebecca Porter is a 80 year old right-handed female who presents for TIA.  History supplemented by referring provider's note.  In April, she was talking on the phone when she suddenly had word-finding difficulty and couldn't get the words out.  No associated slurred speech, facial droop, visual disturbance, numbness or weakness.  Symptoms gradually improved and resolved after 10-15 minutes.  Since then, she may sometimes have vertigo if she tilts her head back.  Sometimes she feels a little more unsteady.    Labs from May include LDL 82, glucose 101  She takes atorvastatin '10mg'$  and Toprol XL  PAST MEDICAL HISTORY: Past Medical History:  Diagnosis Date   Cancer [facility])    breast cancer 2002    Dyspnea    with exertion    GERD (gastroesophageal reflux disease)    History of kidney stones    Personal history of radiation therapy 80   Sepsis ([place])    due to kidney stone blocking the flow per pt. family  79-2019  IV INVANZ    PAST SURGICAL HISTORY: Past Surgical History:  Procedure Laterality Date   BREAST BIOPSY Left    malignant stereo    BREAST LUMPECTOMY Left 2002   CHOLECYSTECTOMY     1997   CYSTOSCOPY     Left stone removal 12-31-17   CYSTOSCOPY/URETEROSCOPY/HOLMIUM LASER/STENT PLACEMENT Left 01/10/2018   Procedure: CYSTOSCOPY, URETEROSCOPY,RETROGRADE PYLEGRAM,BASKET STONE EXTRACTION, AND  STENT REMOVAL;  Surgeon: Kathie Rhodes, MD;  Location[facility];   Service: Urology;  Laterality: Left;   IR LUMBAR DISC ASPIRATION W/IMG GUIDE  11/29/2017   abscess on spine  between L5 and S1   On IV Invanz    RENAL ARTERY STENT     left November 07 2017    MEDICATIONS: Current Outpatient Medications on File Prior to Visit  Medication Sig Dispense Refill   Artificial Saliva (BIOTENE MOISTURIZING MOUTH MT) Biotene Moisturizing Mouth mucosal spray  USE 1 SPRAY(S) IN MOUTH ONCE DAILY AS NEEDED FOR MOUTH THROAT     atorvastatin (LIPITOR) 10 MG tablet atorvastatin 10 mg tablet  TAKE 1 TABLET BY MOUTH ONCE DAILY FOR 90 DAYS     metoprolol succinate (TOPROL-XL) 25 MG 24 hr tablet Take 1 tablet by mouth once daily 90 tablet 3   oxybutynin (DITROPAN XL) 15 MG 24 hr tablet Take 15 mg by mouth daily.     trimethoprim (TRIMPEX) 100 MG tablet Alternate 100 mg and 50 mg every other day.     No current facility-administered medications on file prior to visit.    ALLERGIES: Allergies  Allergen Reactions   Bactrim [Sulfamethoxazole-Trimethoprim] Anaphylaxis and Rash   Ciprofloxacin Hcl Swelling and Rash    Throat swelling   Penicillins Anaphylaxis and Hives    Has patient had a PCN reaction causing immediate rash, facial/tongue/throat swelling, SOB or lightheadedness with hypotension: Yes Has patient had a PCN reaction causing severe rash involving mucus membranes or skin necrosis: No Has patient had a PCN reaction that required hospitalization:  unknown Has patient had a PCN reaction occurring within the last 10 years: No If all of the above answers are "NO", then may proceed with Cephalosporin use.   Macrobid [Nitrofurantoin Macrocrystal] Other (See Comments)    Unknown reaction   Protonix [Pantoprazole Sodium] Other (See Comments)    Unknown reaction    FAMILY HISTORY: Family History  Problem Relation Age of Onset   Breast cancer Neg Hx     Objective:  Blood pressure 120/80, pulse 79, resp. rate 18, height 5' 6.5" (1.689 m), weight 223 lb (101.2 kg),  SpO2 99 %. General: No acute distress.  Patient appears well-groomed.   Head:  Normocephalic/atraumatic Eyes:  fundi examined but not visualized Neck: supple, no paraspinal tenderness, full range of motion Back: No paraspinal tenderness Heart: regular rate and rhythm Lungs: Clear to auscultation bilaterally. Vascular: No carotid bruits. Neurological Exam: Mental status: alert and oriented to person, place, and time, recent and remote memory intact, fund of knowledge intact, attention and concentration intact, speech fluent and not dysarthric, language intact. Cranial nerves: CN I: not tested CN II: pupils equal, round and reactive to light, visual fields intact CN III, IV, VI:  full range of motion, no nystagmus, no ptosis CN V: facial sensation intact. CN VII: upper and lower face symmetric CN VIII: hearing intact CN IX, X: gag intact, uvula midline CN XI: sternocleidomastoid and trapezius muscles intact CN XII: tongue midline Bulk & Tone: normal, no fasciculations. Motor:  muscle strength 5/5 throughout Sensation:  Pinprick, temperature and vibratory sensation intact. Deep Tendon Reflexes:  2+ throughout,  toes downgoing.   Finger to nose testing:  Without dysmetria.   Heel to shin:  Without dysmetria.   Gait:  Normal station and stride.  Romberg negative.    Thank you for allowing me to take part in the care of this patient.  Metta Clines, DO  CC: London Pepper, MD

## 2020-12-06 ENCOUNTER — Other Ambulatory Visit (INDEPENDENT_AMBULATORY_CARE_PROVIDER_SITE_OTHER): Payer: Medicare Other

## 2020-12-06 ENCOUNTER — Encounter: Payer: Self-pay | Admitting: Neurology

## 2020-12-06 ENCOUNTER — Ambulatory Visit (INDEPENDENT_AMBULATORY_CARE_PROVIDER_SITE_OTHER): Payer: Medicare Other | Admitting: Neurology

## 2020-12-06 ENCOUNTER — Other Ambulatory Visit: Payer: Self-pay

## 2020-12-06 VITALS — BP 120/80 | HR 79 | Resp 18 | Ht 66.5 in | Wt 223.0 lb

## 2020-12-06 DIAGNOSIS — G459 Transient cerebral ischemic attack, unspecified: Secondary | ICD-10-CM | POA: Diagnosis not present

## 2020-12-06 DIAGNOSIS — I10 Essential (primary) hypertension: Secondary | ICD-10-CM | POA: Diagnosis not present

## 2020-12-06 DIAGNOSIS — E785 Hyperlipidemia, unspecified: Secondary | ICD-10-CM | POA: Diagnosis not present

## 2020-12-06 LAB — LIPID PANEL
Cholesterol: 159 mg/dL (ref 0–200)
HDL: 65.9 mg/dL (ref >39.00)
LDL Cholesterol: 77 mg/dL (ref 0–99)
NonHDL: 92.65
Total CHOL/HDL Ratio: 2
Triglycerides: 76 mg/dL (ref 0.0–149.0)
VLDL: 15.2 mg/dL (ref 0.0–40.0)

## 2020-12-06 NOTE — Patient Instructions (Signed)
Take aspirin '81mg'$  daily Check lipid panel Check CT head Check carotid ultrasound Check echocardiogram 6.  2 week cardiac event  7.  Follow up in 6 months

## 2020-12-09 ENCOUNTER — Encounter: Payer: Self-pay | Admitting: *Deleted

## 2020-12-09 ENCOUNTER — Other Ambulatory Visit: Payer: Self-pay | Admitting: Neurology

## 2020-12-09 ENCOUNTER — Ambulatory Visit: Payer: Medicare Other

## 2020-12-09 DIAGNOSIS — G459 Transient cerebral ischemic attack, unspecified: Secondary | ICD-10-CM

## 2020-12-09 DIAGNOSIS — I4891 Unspecified atrial fibrillation: Secondary | ICD-10-CM

## 2020-12-09 DIAGNOSIS — I447 Left bundle-branch block, unspecified: Secondary | ICD-10-CM

## 2020-12-09 NOTE — Progress Notes (Unsigned)
Patient enrolled for [employer] to mail a 14 day ZIO AT Telemetry monitor to her home. Letter with instructions mailed to patient.

## 2020-12-18 ENCOUNTER — Ambulatory Visit (HOSPITAL_COMMUNITY)
Admission: RE | Admit: 2020-12-18 | Discharge: 2020-12-18 | Disposition: A | Discharge status: Home / Self Care | Payer: Medicare Other | Source: Ambulatory Visit | Attending: Internal Medicine | Admitting: Internal Medicine

## 2020-12-18 ENCOUNTER — Other Ambulatory Visit: Payer: Self-pay

## 2020-12-18 DIAGNOSIS — G459 Transient cerebral ischemic attack, unspecified: Secondary | ICD-10-CM | POA: Insufficient documentation

## 2020-12-20 ENCOUNTER — Telehealth: Payer: Self-pay | Admitting: Neurology

## 2020-12-20 NOTE — Telephone Encounter (Signed)
Pt given U/S carotid results. Pt want to know if her Labs results have come back.  Advised that Dr.Jaffe will give her call once it has been received.

## 2020-12-20 NOTE — Progress Notes (Signed)
Pt advised of her U/S carotid.

## 2020-12-20 NOTE — Telephone Encounter (Signed)
Pt is returning mahinas call

## 2020-12-23 MED ORDER — ATORVASTATIN CALCIUM 20 MG PO TABS: 20.0000 mg | ORAL_TABLET | Freq: Every day | ORAL | 1 refills | Status: DC

## 2020-12-23 NOTE — Telephone Encounter (Signed)
Per DR.Jaffe Yes, she can increase to '20mg'$  daily.  OK to send a prescription.  Pt advised New dosage sent to the pharmacy.

## 2020-12-23 NOTE — Telephone Encounter (Signed)
Per Dr.Jaffe, Yes, labs are fine.   Pt stated she was advised to increase the Lipitor to 20 mg if her LDL level was above 70 by Dr.Jaffe.   Please advised.

## 2020-12-31 ENCOUNTER — Other Ambulatory Visit: Payer: Self-pay

## 2020-12-31 ENCOUNTER — Ambulatory Visit (HOSPITAL_COMMUNITY): Payer: Medicare Other | Attending: Cardiology

## 2020-12-31 DIAGNOSIS — G459 Transient cerebral ischemic attack, unspecified: Secondary | ICD-10-CM | POA: Diagnosis not present

## 2020-12-31 LAB — ECHOCARDIOGRAM COMPLETE
Area-P 1/2: 3.46 cm2
P 1/2 time: 558 ms
S' Lateral: 3.3 cm

## 2021-01-24 ENCOUNTER — Ambulatory Visit
Admission: RE | Admit: 2021-01-24 | Discharge: 2021-01-24 | Disposition: A | Discharge status: Home / Self Care | Payer: Medicare Other | Source: Ambulatory Visit | Attending: Neurology | Admitting: Neurology

## 2021-01-24 ENCOUNTER — Other Ambulatory Visit: Payer: Self-pay

## 2021-01-24 DIAGNOSIS — G459 Transient cerebral ischemic attack, unspecified: Secondary | ICD-10-CM | POA: Diagnosis not present

## 2021-01-29 ENCOUNTER — Telehealth: Payer: Self-pay | Admitting: Neurology

## 2021-01-29 NOTE — Telephone Encounter (Signed)
Pt called in and left a message wanting to get her CT results

## 2021-01-29 NOTE — Progress Notes (Signed)
Pt advised of her CT results.

## 2021-01-29 NOTE — Telephone Encounter (Signed)
Please see results notes

## 2021-02-11 DIAGNOSIS — N3941 Urge incontinence: Secondary | ICD-10-CM | POA: Diagnosis not present

## 2021-02-11 DIAGNOSIS — N3944 Nocturnal enuresis: Secondary | ICD-10-CM | POA: Diagnosis not present

## 2021-02-11 DIAGNOSIS — R350 Frequency of micturition: Secondary | ICD-10-CM | POA: Diagnosis not present

## 2021-02-21 DIAGNOSIS — M1711 Unilateral primary osteoarthritis, right knee: Secondary | ICD-10-CM | POA: Diagnosis not present

## 2021-03-05 DIAGNOSIS — E785 Hyperlipidemia, unspecified: Secondary | ICD-10-CM | POA: Diagnosis not present

## 2021-03-05 DIAGNOSIS — L609 Nail disorder, unspecified: Secondary | ICD-10-CM | POA: Diagnosis not present

## 2021-03-05 DIAGNOSIS — D649 Anemia, unspecified: Secondary | ICD-10-CM | POA: Diagnosis not present

## 2021-03-05 DIAGNOSIS — Z8673 Personal history of transient ischemic attack (TIA), and cerebral infarction without residual deficits: Secondary | ICD-10-CM | POA: Diagnosis not present

## 2021-03-05 DIAGNOSIS — R030 Elevated blood-pressure reading, without diagnosis of hypertension: Secondary | ICD-10-CM | POA: Diagnosis not present

## 2021-03-05 DIAGNOSIS — M549 Dorsalgia, unspecified: Secondary | ICD-10-CM | POA: Diagnosis not present

## 2021-03-11 ENCOUNTER — Other Ambulatory Visit: Payer: Self-pay | Admitting: Cardiovascular Disease

## 2021-03-13 ENCOUNTER — Ambulatory Visit: Payer: Self-pay | Admitting: Podiatry

## 2021-03-17 ENCOUNTER — Telehealth: Payer: Self-pay

## 2021-03-17 ENCOUNTER — Ambulatory Visit (INDEPENDENT_AMBULATORY_CARE_PROVIDER_SITE_OTHER): Payer: Medicare Other | Admitting: Podiatry

## 2021-03-17 ENCOUNTER — Other Ambulatory Visit: Payer: Self-pay

## 2021-03-17 DIAGNOSIS — L608 Other nail disorders: Secondary | ICD-10-CM | POA: Diagnosis not present

## 2021-03-17 DIAGNOSIS — Z800 Family history of malignant neoplasm of digestive organs: Secondary | ICD-10-CM | POA: Insufficient documentation

## 2021-03-17 DIAGNOSIS — R350 Frequency of micturition: Secondary | ICD-10-CM | POA: Insufficient documentation

## 2021-03-17 DIAGNOSIS — R1310 Dysphagia, unspecified: Secondary | ICD-10-CM | POA: Insufficient documentation

## 2021-03-17 DIAGNOSIS — B351 Tinea unguium: Secondary | ICD-10-CM | POA: Diagnosis not present

## 2021-03-17 DIAGNOSIS — E785 Hyperlipidemia, unspecified: Secondary | ICD-10-CM | POA: Insufficient documentation

## 2021-03-17 DIAGNOSIS — L84 Corns and callosities: Secondary | ICD-10-CM

## 2021-03-17 DIAGNOSIS — K219 Gastro-esophageal reflux disease without esophagitis: Secondary | ICD-10-CM | POA: Insufficient documentation

## 2021-03-17 DIAGNOSIS — R030 Elevated blood-pressure reading, without diagnosis of hypertension: Secondary | ICD-10-CM | POA: Insufficient documentation

## 2021-03-17 DIAGNOSIS — E559 Vitamin D deficiency, unspecified: Secondary | ICD-10-CM | POA: Insufficient documentation

## 2021-03-17 MED ORDER — ATORVASTATIN CALCIUM 20 MG PO TABS: 20.0000 mg | ORAL_TABLET | Freq: Every day | ORAL | 1 refills | Status: DC

## 2021-03-17 NOTE — Telephone Encounter (Signed)
Message left by patient, Please send in a 90 day supply of the Lipitor to the pharmacy.   Telephone call to pt, Advised the new script sent in September was a  30 day supply with 1 refill to see how she does on the new dose.  Per Pt she doing great. She was advised by her pcp her LDL level has decreased  Please advise if it is ok to  send a 90 day supply.

## 2021-03-20 NOTE — Progress Notes (Signed)
Subjective:   Patient ID: Rebecca Porter, female   DOB: 80 y.o.   MRN: [id number]   HPI 80 year old female presents the office with concerns of her nails becoming thickened discolored which is been ongoing since 2019.  She has been on oral Lamisil without any significant improvement.  She denies any swelling or redness of the toenail sites.  Also she has a callus on the right foot submetatarsal 1.  No open lesions.  No drainage or pus that she reports.  No other concerns.   Review of Systems  All other systems reviewed and are negative.  Past Medical History:  Diagnosis Date   Cancer [facility])    breast cancer 2002    Dyspnea    with exertion    GERD (gastroesophageal reflux disease)    History of kidney stones    Personal history of radiation therapy 2002   Sepsis ([place])    due to kidney stone blocking the flow per pt. family  10-2017  IV INVANZ    Past Surgical History:  Procedure Laterality Date   BREAST BIOPSY Left    malignant stereo    BREAST LUMPECTOMY Left 2002   CHOLECYSTECTOMY     1997   CYSTOSCOPY     Left stone removal 12-31-17   CYSTOSCOPY/URETEROSCOPY/HOLMIUM LASER/STENT PLACEMENT Left 01/10/2018   Procedure: CYSTOSCOPY, URETEROSCOPY,RETROGRADE PYLEGRAM,BASKET STONE EXTRACTION, AND  STENT REMOVAL;  Surgeon: Kathie Rhodes, MD;  Location: [facility];  Service: Urology;  Laterality: Left;   IR LUMBAR DISC ASPIRATION W/IMG GUIDE  11/29/2017   abscess on spine  between L5 and S1   On IV Invanz    RENAL ARTERY STENT     left November 07 2017     Current Outpatient Medications:    naproxen (NAPROSYN) 250 MG tablet, Take by mouth 2 (two) times daily with a meal., Disp: , Rfl:    traMADol (ULTRAM) 50 MG tablet, Take by mouth every 6 (six) hours as needed., Disp: , Rfl:    Artificial Saliva (BIOTENE MOISTURIZING MOUTH MT), Biotene Moisturizing Mouth mucosal spray  USE 1 SPRAY(S) IN MOUTH ONCE DAILY AS NEEDED FOR MOUTH THROAT, Disp: , Rfl:    atorvastatin (LIPITOR) 20 MG tablet, Take  1 tablet (20 mg total) by mouth daily., Disp: 90 tablet, Rfl: 1   metoprolol succinate (TOPROL-XL) 25 MG 24 hr tablet, Take 1 tablet by mouth once daily, Disp: 90 tablet, Rfl: 3   oxybutynin (DITROPAN XL) 15 MG 24 hr tablet, Take 15 mg by mouth daily., Disp: , Rfl:    trimethoprim (TRIMPEX) 100 MG tablet, Alternate 100 mg and 50 mg every other day., Disp: , Rfl:   Allergies  Allergen Reactions   Bactrim [Sulfamethoxazole-Trimethoprim] Anaphylaxis and Rash   Ciprofloxacin Hcl Swelling and Rash    Throat swelling   Penicillins Anaphylaxis, Hives and Other (See Comments)    Has patient had a PCN reaction causing immediate rash, facial/tongue/throat swelling, SOB or lightheadedness with hypotension: Yes Has patient had a PCN reaction causing severe rash involving mucus membranes or skin necrosis: No Has patient had a PCN reaction that required hospitalization: unknown Has patient had a PCN reaction occurring within the last 10 years: No If all of the above answers are "NO", then may proceed with Cephalosporin use.   Ciprofloxacin Other (See Comments)   Macrobid [Nitrofurantoin Macrocrystal] Other (See Comments)    Unknown reaction   Nitrofurantoin Other (See Comments)   Protonix [Pantoprazole Sodium] Other (See Comments)    Unknown reaction  Objective:  Physical Exam  General: AAO x3, NAD  Dermatological: Nails are hypertrophic, dystrophic with yellow-brown discoloration.  Nails are elongated as well.  Hyperkeratotic lesion submetatarsal 1 on the right foot without any underlying ulceration drainage or signs of infection.  No open lesions bilaterally.  Vascular: Dorsalis Pedis artery and Posterior Tibial artery pedal pulses are 2/4 bilateral with immedate capillary fill time. There is no pain with calf compression, swelling, warmth, erythema.   Neruologic: Grossly intact via light touch bilateral.   Musculoskeletal: No gross boney pedal deformities bilateral. No pain,  crepitus, or limitation noted with foot and ankle range of motion bilateral. Muscular strength 5/5 in all groups tested bilateral.  Gait: Unassisted, Nonantalgic.       Assessment:   Onychomycosis, hyperkeratotic lesion     Plan:  -Treatment options discussed including all alternatives, risks, and complications -Etiology of symptoms were discussed -Given that she has been on oral medication without any improvement I discussed potential other medications however given the fact that the nails may not be beneficial.  Today I sharply debrided the nails without any complications or bleeding and sent for culture, pathology to [facility] labs.  -Debrided hyperkeratotic lesion without any complications or bleeding.  Recommend moisturizer and offloading.  Trula Slade DPM

## 2021-03-27 ENCOUNTER — Ambulatory Visit (INDEPENDENT_AMBULATORY_CARE_PROVIDER_SITE_OTHER): Payer: Medicare Other | Admitting: Cardiovascular Disease

## 2021-03-27 ENCOUNTER — Other Ambulatory Visit: Payer: Self-pay

## 2021-03-27 ENCOUNTER — Encounter: Payer: Self-pay | Admitting: Cardiovascular Disease

## 2021-03-27 DIAGNOSIS — I10 Essential (primary) hypertension: Secondary | ICD-10-CM

## 2021-03-27 DIAGNOSIS — R600 Localized edema: Secondary | ICD-10-CM | POA: Diagnosis not present

## 2021-03-27 DIAGNOSIS — I071 Rheumatic tricuspid insufficiency: Secondary | ICD-10-CM | POA: Diagnosis not present

## 2021-03-27 DIAGNOSIS — G459 Transient cerebral ischemic attack, unspecified: Secondary | ICD-10-CM

## 2021-03-27 DIAGNOSIS — I340 Nonrheumatic mitral (valve) insufficiency: Secondary | ICD-10-CM

## 2021-03-27 DIAGNOSIS — I5189 Other ill-defined heart diseases: Secondary | ICD-10-CM | POA: Diagnosis not present

## 2021-03-27 MED ORDER — HYDROCHLOROTHIAZIDE 25 MG PO TABS: 12.5000 mg | ORAL_TABLET | ORAL | 3 refills | Status: DC | PRN

## 2021-03-27 MED ORDER — ATORVASTATIN CALCIUM 20 MG PO TABS: 40.0000 mg | ORAL_TABLET | Freq: Every day | ORAL | Status: DC

## 2021-03-27 NOTE — Patient Instructions (Addendum)
Medication Instructions:   -Hydrochlorothiazide (hydrodiuril) 12.5mg  as needed for swelling.  *If you need a refill on your cardiac medications before your next appointment, please call your pharmacy*   Follow-Up: At [facility], you and your health needs are our priority.  As part of our continuing mission to provide you with exceptional heart care, we have created designated Provider Care Teams.  These Care Teams include your primary Cardiologist (physician) and Advanced Practice Providers (APPs -  Physician Assistants and Nurse Practitioners) who all work together to provide you with the care you need, when you need it.  We recommend signing up for the patient portal called "MyChart".  Sign up information is provided on this After Visit Summary.  MyChart is used to connect with patients for Virtual Visits (Telemedicine).  Patients are able to view lab/test results, encounter notes, upcoming appointments, etc.  Non-urgent messages can be sent to your provider as well.   To learn more about what you can do with MyChart, go to [email or web address].    Your next appointment:   12 month(s)  The format for your next appointment:   In Person  Provider:   Shelva Majestic, MD { Plan

## 2021-03-27 NOTE — Progress Notes (Signed)
Cardiology Office Note    Date:  04/03/2021   ID:  Rebecca Porter 1940/05/07, MRN [id number]  PCP:  London Pepper, MD  Cardiologist:  Shelva Majestic, MD   F/U cardiololgy evaluation   History of Present Illness:  Rebecca Porter is a 80 y.o. female who presents to the office today for a 13 month follow-up cardiology evaluation.  Rebecca Porter has a history of a left kidney stone leading to hospitalization in July 2019 with urosepsis secondary to E. coli sensitive to ertapenem.  She subsequently developed low back pain and was felt to have L4-L5 epidural abscess and discitis leading to her hospitalization at [facility] in August 2019.  She was evaluated by neurology and had a PICC line inserted for 6 weeks of antibiotic therapy.  She has noticed mild shortness of breath and progressive exertional dyspnea but denies any definitive episodes of chest pain or palpitations.  She is in need to undergo urologic surgery and audiology clearance was recommended prior to surgery due to new left bundle branch block.  Of note, the patient states that she was evaluated July and ECG at that time was done.  I was able to obtain the ECG report but not see the tracing and reportedly this was interpreted as sinus tachycardia with anteroseptal infarct, age undetermined, ST abnormality with possible lateral subendocardial injury and was felt to be abnormal without prior ECG available for comparison.  When I saw her for initial cardiology evaluation, she denied any episodes of chest pressure prior MI but admitted to mild progressive shortness of breath with exertion and had noticed some leg swelling.  Preoperative echo Doppler study and nuclear stress testing.  Her nuclear stress test revealed an EF of 46%.  There were no ECG changes.  There was a medium defect of moderate severity in the anteroseptal septal wall and it was felt most likely this was due to left bundle branch block attenuation artifact but anteroseptal  ischemia could not be completely excluded.  An echo Doppler study revealed an ejection fraction at 55% without wall motion abnormalities.  She had mild PA pressure elevation at 41 mm.  She was given clearance to undergo her urologic surgery which she was able to tolerate well without cardiovascular compromise.  She states she has lost 34 pounds over 4 months.  She did not tolerate lisinopril due to cough.  He denies recent chest pain or shortness of breath.  I saw her in November 2019.  At that time her blood pressure was elevated and her resting pulse was 90.  I added Toprol-XL 25 mg to her medical regimen.    I saw her in September 2020.  Over the prior year, her blood pressure at home typically was running in the 120s to 130s.  She had noticed some intermittent ankle swelling.  She had undergone laboratory by her primary physician Dr. London Pepper in February 2020.  Her total cholesterol was 222 and LDL cholesterol 130.  She was not started on any lipid-lowering therapy.  She has been on metoprolol 25 mg daily.  During that evaluation, her blood pressure remained elevated and with a history of some intermittent mild ankle swelling I gave her a prescription for HCTZ to take on an as needed basis for swelling and if her blood pressure remained in the 130 or above range.  I last saw her on March 04, 2020 and over the prior year she had continued to do well..  However she admits  to a 25 pound weight gain since the Covid pandemic began.  She did receive the Covid vaccine.  She continues to be followed by Dr. Orland Mustard who saw her in September 2021. Her blood pressure at home typically is less than 130/80.  She denied any significant swelling.    Since I last saw her, she was felt to have a TIA in April 2022 when she suddenly had word finding difficulty and could not get the words out.  There was no associated slurred speech or facial droop, numbness or weakness or visual disturbance.  Symptoms resolved after  approximately 10 to 15 minutes.  She has been evaluated by Dr. Tomi Likens of neurology.  Subsequently, she underwent carotid Doppler studies on September 7 which showed mild 1 to 39% plaque in both carotids and normal vertebral flow.  A 2D echo Doppler study on December 31, 2020 showed EF at 50 to 55% with grade 1 diastolic dysfunction.  There was mild MR and moderate TR.  Presently, she denies any chest pain or recurrent neurologic symptoms.  She does experience intermittent lower extremity edema bilaterally but she states she recently ate at the [employer] and her food was very salty.  She has been on atorvastatin 20 mg daily and LDL cholesterol in August 2022 was 77.  She is on metoprolol succinate 25 mg daily.  She presents for evaluation.  She presents for evaluation.   Past Medical History:  Diagnosis Date   Cancer [facility])    breast cancer 2002    Dyspnea    with exertion    GERD (gastroesophageal reflux disease)    History of kidney stones    Personal history of radiation therapy 2002   Sepsis ([place])    due to kidney stone blocking the flow per pt. family  10-2017  IV INVANZ    Past Surgical History:  Procedure Laterality Date   BREAST BIOPSY Left    malignant stereo    BREAST LUMPECTOMY Left 2002   CHOLECYSTECTOMY     1997   CYSTOSCOPY     Left stone removal 12-31-17   CYSTOSCOPY/URETEROSCOPY/HOLMIUM LASER/STENT PLACEMENT Left 01/10/2018   Procedure: CYSTOSCOPY, URETEROSCOPY,RETROGRADE PYLEGRAM,BASKET STONE EXTRACTION, AND  STENT REMOVAL;  Surgeon: Kathie Rhodes, MD;  Location: [facility];  Service: Urology;  Laterality: Left;   IR LUMBAR DISC ASPIRATION W/IMG GUIDE  11/29/2017   abscess on spine  between L5 and S1   On IV Invanz    RENAL ARTERY STENT     left November 07 2017    Current Medications: Outpatient Medications Prior to Visit  Medication Sig Dispense Refill   Artificial Saliva (BIOTENE MOISTURIZING MOUTH MT) Biotene Moisturizing Mouth mucosal spray  USE 1 SPRAY(S) IN MOUTH  ONCE DAILY AS NEEDED FOR MOUTH THROAT     metoprolol succinate (TOPROL-XL) 25 MG 24 hr tablet Take 1 tablet by mouth once daily 90 tablet 3   naproxen (NAPROSYN) 250 MG tablet Take by mouth 2 (two) times daily with a meal.     oxybutynin (DITROPAN XL) 15 MG 24 hr tablet Take 15 mg by mouth daily.     trimethoprim (TRIMPEX) 100 MG tablet Alternate 100 mg and 50 mg every other day.     atorvastatin (LIPITOR) 20 MG tablet Take 1 tablet (20 mg total) by mouth daily. (Patient taking differently: Take 40 mg by mouth daily.) 90 tablet 1   traMADol (ULTRAM) 50 MG tablet Take by mouth every 6 (six) hours as needed. (Patient not taking: Reported  on 03/27/2021)     No facility-administered medications prior to visit.     Allergies:   Ciprofloxacin hcl, Penicillins, Ciprofloxacin, Macrobid [nitrofurantoin macrocrystal], Nitrofurantoin, and Protonix [pantoprazole sodium]   Social History   Socioeconomic History   Marital status: Widowed    Spouse name: Not on file   Number of children: Not on file   Years of education: Not on file   Highest education level: Not on file  Occupational History   Not on file  Tobacco Use   Smoking status: Never   Smokeless tobacco: Never  Vaping Use   Vaping Use: Never used  Substance and Sexual Activity   Alcohol use: Never   Drug use: Never   Sexual activity: Not Currently  Other Topics Concern   Not on file  Social History Narrative   Right handed   No caffeine   One story home   Social Determinants of Health   Financial Resource Strain: Not on file  Food Insecurity: Not on file  Transportation Needs: Not on file  Physical Activity: Not on file  Stress: Not on file  Social Connections: Not on file    Social history is notable that she is widowed for >10 years.  She has 2 children, 4 grandchildren, and 2 great-grandchildren.  She is self-employed and is in [employer].  There is no tobacco history.  She does not exercise.  There is no  alcohol use.  Family History:  The patient's family history is not on file.   Family history is notable for mother dying at age 65 with colon cancer.  Her father died at age 78 but has suffered a massive heart attack in 76.    ROS General: Negative; No fevers, chills, or night sweats;  HEENT: Negative; No changes in vision or hearing, sinus congestion, difficulty swallowing Pulmonary: Negative; No cough, wheezing, shortness of breath, hemoptysis Cardiovascular: Positive for exertional dyspnea.  No chest pain or palpitations; no awareness of prior MI; positive for leg swelling GI: Negative; No nausea, vomiting, diarrhea, or abdominal pain GU: Positive for recent urosepsis and left kidney stone with subsequent stenting Musculoskeletal: Right knee discomfort which has limited her activity. Hematologic/Oncology: Negative; no easy bruising, bleeding Endocrine: Negative; no heat/cold intolerance; no diabetes Neuro: Sciatica symptoms from the spine abscess; probable TIA April 2022 Skin: Negative; No rashes or skin lesions Psychiatric: Negative; No behavioral problems, depression Sleep: Negative; No snoring, daytime sleepiness, hypersomnolence, bruxism, restless legs, hypnogognic hallucinations, no cataplexy Other comprehensive 14 point system review is negative.   PHYSICAL EXAM:   VS:  BP (!) 174/90    Pulse 65    Ht 5' 6.5" (1.689 m)    SpO2 96%    BMI 35.45 kg/m     Repeat blood pressure by me 132/72  Wt Readings from Last 3 Encounters:  12/06/20 223 lb (101.2 kg)  03/04/20 226 lb 6.4 oz (102.7 kg)  12/28/18 205 lb (93 kg)    General: Alert, oriented, no distress.  Skin: normal turgor, no rashes, warm and dry HEENT: Normocephalic, atraumatic. Pupils equal round and reactive to light; sclera anicteric; extraocular muscles intact;  Nose without nasal septal hypertrophy Mouth/Parynx benign; Mallinpatti scale 3 Neck: No JVD, no carotid bruits; normal carotid upstroke Lungs: clear to  ausculatation and percussion; no wheezing or rales Chest wall: without tenderness to palpitation Heart: PMI not displaced, RRR, s1 s2 normal, 1/6 systolic murmur, no diastolic murmur, no rubs, gallops, thrills, or heaves Abdomen: soft, nontender; no hepatosplenomehaly,  BS+; abdominal aorta nontender and not dilated by palpation. Back: no CVA tenderness Pulses 2+ Musculoskeletal: full range of motion, normal strength, no joint deformities Extremities: 1-2+ bilateral lower extremity edema no clubbing cyanosis. Homan's sign negative  Neurologic: grossly nonfocal; Cranial nerves grossly wnl Psychologic: Normal mood and affect   Studies/Labs Reviewed:   March 27, 2021 ECG (independently read by me): Sinus rhythm at 65; PVC  March 04, 2021 ECG (independently read by me): NSR at 64; LBBB; QTc 466 msec  September 2020 ECG (independently read by me): Normal sinus rhythm at 60 bpm, left bundle branch block, QTc interval 456 Rebecca  February 11, 2018 ECG (independently read by me): Normal sinus rhythm at 90 bpm.  Left bundle branch block with repolarization changes.  QTc interval 481 Rebecca.  December 28, 2017 ECG (independently read by me): Normal sinus rhythm at 88 bpm.  Left bundle branch block with repolarization changes.  Poor R wave progression V1 through V6.  December 22, 2017 ECG (independently reviewed by me): Normal sinus rhythm.  Left bundle branch block with repolarization changes.   Recent Labs: BMP Latest Ref Rng & Units 12/22/2017 11/27/2017 11/26/2017  Glucose 70 - 99 mg/dL 89 91 100(H)  BUN 8 - 23 mg/dL 22 24(H) 27(H)  Creatinine 0.44 - 1.00 mg/dL 0.85 0.77 0.77  Sodium 135 - 145 mmol/L 143 138 139  Potassium 3.5 - 5.1 mmol/L 3.7 4.1 4.3  Chloride 98 - 111 mmol/L 108 108 108  CO2 22 - 32 mmol/L 26 25 25   Calcium 8.9 - 10.3 mg/dL 9.6 9.0 9.1     Hepatic Function Latest Ref Rng & Units 12/30/2017 11/26/2017  Total Protein 6.0 - 8.5 g/dL 6.4 6.0(L)  Albumin 3.5 - 4.8 g/dL 3.5  2.4(L)  AST 0 - 40 IU/L 20 16  ALT 0 - 32 IU/L 15 21  Alk Phosphatase 39 - 117 IU/L 98 145(H)  Total Bilirubin 0.0 - 1.2 mg/dL 0.4 0.6  Bilirubin, Direct 0.00 - 0.40 mg/dL 0.13 -    CBC Latest Ref Rng & Units 12/22/2017 11/27/2017 11/26/2017  WBC 4.0 - 10.5 K/uL 6.7 9.5 11.5(H)  Hemoglobin 12.0 - 15.0 g/dL 11.1(L) 9.5(L) 9.7(L)  Hematocrit 36.0 - 46.0 % 34.3(L) 29.4(L) 30.2(L)  Platelets 150 - 400 K/uL 222 259 237   Lab Results  Component Value Date   MCV 93.5 12/22/2017   MCV 93.9 11/27/2017   MCV 94.4 11/26/2017   Lab Results  Component Value Date   TSH 2.470 12/30/2017   No results found for: HGBA1C   BNP No results found for: BNP  ProBNP No results found for: PROBNP   Lipid Panel     Component Value Date/Time   CHOL 159 12/06/2020 1540   CHOL 186 12/30/2017 1022   TRIG 76.0 12/06/2020 1540   HDL 65.90 12/06/2020 1540   HDL 65 12/30/2017 1022   CHOLHDL 2 12/06/2020 1540   VLDL 15.2 12/06/2020 1540   LDLCALC 77 12/06/2020 1540   LDLCALC 107 (H) 12/30/2017 1022     RADIOLOGY: No results found.   Additional studies/ records that were reviewed today include:  Reviewed the patient's hospital records and obtained interpretation analysis of a prior ECG.  Recent laboratory was reviewed.  ASSESSMENT:    1. Essential hypertension   2. TIA (transient ischemic attack)   3. Grade I diastolic dysfunction   4. Bilateral lower extremity edema   5. Mild nonrheumatic mitral valve regurgitation   6. Moderate tricuspid regurgitation  PLAN:  Rebecca Porter is a 80 year-old female who developed urosepsis from a left kidney stone which was culture positive for extended spectrum beta-lactamase (ESBL) E. coli requiring PICC line insertion and 6 weeks of antibiotic therapy.  She has multiple allergies and required ertapenem antibiotic therapy.  She denied any prior cardiac history.  Her ECG reported in July 2019 from [place] raise the possibility of age-indeterminate  anteroseptal MI with possible lateral subendocardial injury.  Her ECG when I initially evaluated her revealed sinus rhythm with poor R wave progression and left bundle branch block with repolarization changes.  She had experienced exertional dyspnea and increased shortness of breath as well as leg edema and subsequently underwent a nuclear perfusion study and echo Doppler assessment.  Her EF was normal on her echo study without significant valvular pathology and there was evidence for mild grade 1 diastolic dysfunction.  There was mild TR and mildly increased PA pressure.  Her nuclear perfusion study was low risk which showed mild anteroseptal defect most likely due to her underlying left bundle branch block.  Rebecca. Olaes continues to feel well.  She denies any chest pain or exertional dyspnea.  Since her last evaluation with me it appears she may have suffered a small TIA when she had sudden transient difficulty in word finding difficulty and could not get the words out.  There was no associated slurred speech or facial droop, numbness or weakness or visual disturbance.  Symptoms resolved in 10 to 15 minutes.  Upon presentation today her blood pressure was elevated initially at 174/90 but on repeat by me was 132/72.  She states yesterday she ate at the [employer] and her food was very salty.  This has been associated now also with 1-2+ lower extremity edema bilaterally.  I am recommending that she initiate HCTZ 12.5 mg and have also recommended support stockings with 20 to 30 mm pressure support.  I reviewed her most recent echo from September 2022 which showed EF of 50 to 55% with grade 1 diastolic dysfunction, mild MR and moderate TR.  Carotid Doppler studies did not reveal significant stenoses with mild plaque in the 1 and 39% range and normal antegrade vertebral flow.  Most recent LDL cholesterol in August was 77.  She has been on atorvastatin 20 mg.  I have recommended more aggressive intervention and agree  with recent titration to 40 mg with target LDL less than 70.  I advised her on sodium restriction.  BMI is consistent with moderate obesity, weight loss and exercise was recommended.  I will see her in in 1 year for follow-up evaluation or sooner as needed.   Medication Adjustments/Labs and Tests Ordered: Current medicines are reviewed at length with the patient today.  Concerns regarding medicines are outlined above.  Medication changes, Labs and Tests ordered today are listed in the Patient Instructions below. Patient Instructions  Medication Instructions:   -Hydrochlorothiazide (hydrodiuril) 12.44m as needed for swelling.  *If you need a refill on your cardiac medications before your next appointment, please call your pharmacy*   Follow-Up: At [facility] you and your health needs are our priority.  As part of our continuing mission to provide you with exceptional heart care, we have created designated Provider Care Teams.  These Care Teams include your primary Cardiologist (physician) and Advanced Practice Providers (APPs -  Physician Assistants and Nurse Practitioners) who all work together to provide you with the care you need, when you need it.  We recommend  signing up for the patient portal called "MyChart".  Sign up information is provided on this After Visit Summary.  MyChart is used to connect with patients for Virtual Visits (Telemedicine).  Patients are able to view lab/test results, encounter notes, upcoming appointments, etc.  Non-urgent messages can be sent to your provider as well.   To learn more about what you can do with MyChart, go to [email or web address].    Your next appointment:   12 month(s)  The format for your next appointment:   In Person  Provider:   Shelva Majestic, MD {    Signed, Shelva Majestic, MD  04/03/2021 10:57 AM    [facility] [address], [place], [place], [place]  [address] Phone: [phone]

## 2021-03-28 DIAGNOSIS — N3000 Acute cystitis without hematuria: Secondary | ICD-10-CM | POA: Diagnosis not present

## 2021-03-28 DIAGNOSIS — N3941 Urge incontinence: Secondary | ICD-10-CM | POA: Diagnosis not present

## 2021-03-28 DIAGNOSIS — N3944 Nocturnal enuresis: Secondary | ICD-10-CM | POA: Diagnosis not present

## 2021-04-02 DIAGNOSIS — Z124 Encounter for screening for malignant neoplasm of cervix: Secondary | ICD-10-CM | POA: Diagnosis not present

## 2021-04-02 DIAGNOSIS — Z6838 Body mass index (BMI) 38.0-38.9, adult: Secondary | ICD-10-CM | POA: Diagnosis not present

## 2021-04-03 ENCOUNTER — Encounter: Payer: Self-pay | Admitting: Cardiovascular Disease

## 2021-04-10 DIAGNOSIS — M1711 Unilateral primary osteoarthritis, right knee: Secondary | ICD-10-CM | POA: Diagnosis not present

## 2021-04-15 ENCOUNTER — Telehealth: Payer: Self-pay

## 2021-04-15 NOTE — Telephone Encounter (Signed)
° °  Pre-operative Risk Assessment    Patient Name: Rebecca Porter  DOB: 1940-12-03 MRN: [id number]      Request for Surgical Clearance    Procedure:  Right total knee arthroplasty   Date of Surgery:  Clearance 08/25/21                                 Surgeon: Dr.Frank Aluisio Surgeon's Group or Practice Name:  [facility] Phone number:  [phone] Fax number:  [phone]   Type of Clearance Requested:   - Medical    Type of Anesthesia:   Choice   Additional requests/questions:  Please fax a copy of clearance to the surgeon's office.  Evalee Mutton   04/15/2021, 4:21 PM

## 2021-04-16 NOTE — Telephone Encounter (Signed)
° °  Name: Rebecca Porter  DOB: April 11, 1941  MRN: [id number]   Primary Cardiologist: Shelva Majestic, MD  Chart reviewed as part of pre-operative protocol coverage. Patient was contacted 04/16/2021 in reference to pre-operative risk assessment for pending surgery as outlined below.  Rebecca Porter was last seen on 03/27/2021 by Dr. Claiborne Billings.  Since that day, Rebecca Porter has done well without exertional chest pain or worsening dyspnea.  Therefore, based on ACC/AHA guidelines, the patient would be at acceptable risk for the planned procedure without further cardiovascular testing.   The patient was advised that if she develops new symptoms prior to surgery to contact our office to arrange for a follow-up visit, and she verbalized understanding.  I will route this recommendation to the requesting party via Epic fax function and remove from pre-op pool. Please call with questions.  Almyra Deforest, [place] 04/16/2021, 10:19 AM

## 2021-04-23 DIAGNOSIS — N3941 Urge incontinence: Secondary | ICD-10-CM | POA: Diagnosis not present

## 2021-05-01 DIAGNOSIS — N3941 Urge incontinence: Secondary | ICD-10-CM | POA: Diagnosis not present

## 2021-05-01 DIAGNOSIS — N3944 Nocturnal enuresis: Secondary | ICD-10-CM | POA: Diagnosis not present

## 2021-05-12 ENCOUNTER — Other Ambulatory Visit: Payer: Self-pay | Admitting: Obstetrics and Gynecology

## 2021-05-12 DIAGNOSIS — N63 Unspecified lump in unspecified breast: Secondary | ICD-10-CM | POA: Diagnosis not present

## 2021-05-12 DIAGNOSIS — N6320 Unspecified lump in the left breast, unspecified quadrant: Secondary | ICD-10-CM

## 2021-05-16 DIAGNOSIS — Z87448 Personal history of other diseases of urinary system: Secondary | ICD-10-CM | POA: Diagnosis not present

## 2021-05-16 DIAGNOSIS — R310 Gross hematuria: Secondary | ICD-10-CM | POA: Diagnosis not present

## 2021-05-16 DIAGNOSIS — K7689 Other specified diseases of liver: Secondary | ICD-10-CM | POA: Diagnosis not present

## 2021-05-16 DIAGNOSIS — K5730 Diverticulosis of large intestine without perforation or abscess without bleeding: Secondary | ICD-10-CM | POA: Diagnosis not present

## 2021-05-16 DIAGNOSIS — R8271 Bacteriuria: Secondary | ICD-10-CM | POA: Diagnosis not present

## 2021-05-16 DIAGNOSIS — K449 Diaphragmatic hernia without obstruction or gangrene: Secondary | ICD-10-CM | POA: Diagnosis not present

## 2021-05-29 ENCOUNTER — Ambulatory Visit
Admission: RE | Admit: 2021-05-29 | Discharge: 2021-05-29 | Disposition: A | Discharge status: Home / Self Care | Payer: Medicare Other | Source: Ambulatory Visit | Attending: Obstetrics and Gynecology | Admitting: Obstetrics and Gynecology

## 2021-05-29 ENCOUNTER — Other Ambulatory Visit: Payer: Self-pay | Admitting: Obstetrics and Gynecology

## 2021-05-29 DIAGNOSIS — N6320 Unspecified lump in the left breast, unspecified quadrant: Secondary | ICD-10-CM

## 2021-05-29 DIAGNOSIS — R922 Inconclusive mammogram: Secondary | ICD-10-CM | POA: Diagnosis not present

## 2021-06-03 DIAGNOSIS — N3941 Urge incontinence: Secondary | ICD-10-CM | POA: Diagnosis not present

## 2021-06-12 DIAGNOSIS — N3941 Urge incontinence: Secondary | ICD-10-CM | POA: Diagnosis not present

## 2021-06-17 DIAGNOSIS — Z13820 Encounter for screening for osteoporosis: Secondary | ICD-10-CM | POA: Diagnosis not present

## 2021-06-17 DIAGNOSIS — N958 Other specified menopausal and perimenopausal disorders: Secondary | ICD-10-CM | POA: Diagnosis not present

## 2021-06-23 DIAGNOSIS — L720 Epidermal cyst: Secondary | ICD-10-CM | POA: Diagnosis not present

## 2021-06-23 DIAGNOSIS — L565 Disseminated superficial actinic porokeratosis (DSAP): Secondary | ICD-10-CM | POA: Diagnosis not present

## 2021-06-23 DIAGNOSIS — D225 Melanocytic nevi of trunk: Secondary | ICD-10-CM | POA: Diagnosis not present

## 2021-06-23 DIAGNOSIS — L821 Other seborrheic keratosis: Secondary | ICD-10-CM | POA: Diagnosis not present

## 2021-06-23 DIAGNOSIS — L820 Inflamed seborrheic keratosis: Secondary | ICD-10-CM | POA: Diagnosis not present

## 2021-06-23 DIAGNOSIS — L738 Other specified follicular disorders: Secondary | ICD-10-CM | POA: Diagnosis not present

## 2021-06-23 DIAGNOSIS — D171 Benign lipomatous neoplasm of skin and subcutaneous tissue of trunk: Secondary | ICD-10-CM | POA: Diagnosis not present

## 2021-06-23 DIAGNOSIS — D692 Other nonthrombocytopenic purpura: Secondary | ICD-10-CM | POA: Diagnosis not present

## 2021-06-23 DIAGNOSIS — L430 Hypertrophic lichen planus: Secondary | ICD-10-CM | POA: Diagnosis not present

## 2021-06-23 DIAGNOSIS — D485 Neoplasm of uncertain behavior of skin: Secondary | ICD-10-CM | POA: Diagnosis not present

## 2021-06-23 NOTE — Progress Notes (Deleted)
? ?NEUROLOGY FOLLOW UP OFFICE NOTE ? ?[name] ?[id number] ? ?Assessment/Plan:  ? ?Transient ischemic attack ?Hypertension ?Hyperlipidemia ?  ?Secondary stroke prevention as managed by PCP: ?ASA '81mg'$  daily ?Atorvastatin.  LDL goal less than 70 ?Normotensive blood pressure ?Hgb A1c goal less than 7 ?*** ?ddd ?  ?  ?  ?Subjective:  ?Rebecca Porter is a 81 year old right-handed female who follows up for TIA.  CT head personally reviewed. ? ?UPDATE: ?Current medications:  ASA '81mg'$  daily, atorvastatin *** ? ?TIA workup: ?CT head on 01/24/2021 showed chronic small vessel ischemic changes but no acute hemorrhage or stroke.  LDL on 12/06/2020 was 77.  Carotid ultrasound on 12/18/2020 showed no hemodynamically significant ICA stenosis and showed antegrade flow of both VAs.  Echocardiogram on 12/31/2020 showed EF 50-55% with no cardiac source of embolus.   ? ?HISTORY:  ?In April 2022, she was talking on the phone when she suddenly had word-finding difficulty and couldn't get the words out.  No associated slurred speech, facial droop, visual disturbance, numbness or weakness.  Symptoms gradually improved and resolved after 10-15 minutes.  Since then, she may sometimes have vertigo if she tilts her head back.  Sometimes she feels a little more unsteady.   ?  ?Labs from May include LDL 82, glucose 101 ?  ?She takes atorvastatin '10mg'$  and Toprol XL ? ?PAST MEDICAL HISTORY: ?Past Medical History:  ?Diagnosis Date  ? Cancer [facility])   ? breast cancer 2002   ? Dyspnea   ? with exertion   ? GERD (gastroesophageal reflux disease)   ? History of kidney stones   ? Personal history of radiation therapy 2002  ? Sepsis ([place])   ? due to kidney stone blocking the flow per pt. family  10-2017  IV INVANZ  ? ? ?MEDICATIONS: ?Current Outpatient Medications on File Prior to Visit  ?Medication Sig Dispense Refill  ? Artificial Saliva (BIOTENE MOISTURIZING MOUTH MT) Biotene Moisturizing Mouth mucosal spray ? USE 1 SPRAY(S) IN MOUTH ONCE DAILY AS  NEEDED FOR MOUTH THROAT    ? atorvastatin (LIPITOR) 20 MG tablet Take 2 tablets (40 mg total) by mouth daily.    ? hydrochlorothiazide (HYDRODIURIL) 25 MG tablet Take 0.5 tablets (12.5 mg total) by mouth as needed (for swelling). 30 tablet 3  ? metoprolol succinate (TOPROL-XL) 25 MG 24 hr tablet Take 1 tablet by mouth once daily 90 tablet 3  ? naproxen (NAPROSYN) 250 MG tablet Take by mouth 2 (two) times daily with a meal.    ? oxybutynin (DITROPAN XL) 15 MG 24 hr tablet Take 15 mg by mouth daily.    ? traMADol (ULTRAM) 50 MG tablet Take by mouth every 6 (six) hours as needed. (Patient not taking: Reported on 03/27/2021)    ? trimethoprim (TRIMPEX) 100 MG tablet Alternate 100 mg and 50 mg every other day.    ? ?No current facility-administered medications on file prior to visit.  ? ? ?ALLERGIES: ?Allergies  ?Allergen Reactions  ? Ciprofloxacin Hcl Swelling and Rash  ?  Throat swelling  ? Penicillins Anaphylaxis, Hives and Other (See Comments)  ?  Has patient had a PCN reaction causing immediate rash, facial/tongue/throat swelling, SOB or lightheadedness with hypotension: Yes ?Has patient had a PCN reaction causing severe rash involving mucus membranes or skin necrosis: No ?Has patient had a PCN reaction that required hospitalization: unknown ?Has patient had a PCN reaction occurring within the last 10 years: No ?If all of the above answers are "NO",  then may proceed with Cephalosporin use.  ? Ciprofloxacin Other (See Comments)  ? Macrobid [Nitrofurantoin Macrocrystal] Other (See Comments)  ?  Unknown reaction  ? Nitrofurantoin Other (See Comments)  ? Protonix [Pantoprazole Sodium] Other (See Comments)  ?  Unknown reaction  ? ? ?FAMILY HISTORY: ?Family History  ?Problem Relation Age of Onset  ? Breast cancer Neg Hx   ? ? ?  ?Objective:  ?*** ?General: No acute distress.  Patient appears ***-groomed.   ?Head:  Normocephalic/atraumatic ?Eyes:  Fundi examined but not visualized ?Neck: supple, no paraspinal tenderness,  full range of motion ?Heart:  Regular rate and rhythm ?Lungs:  Clear to auscultation bilaterally ?Back: No paraspinal tenderness ?Neurological Exam: alert and oriented to person, place, and time.  Speech fluent and not dysarthric, language intact.  CN II-XII intact. Bulk and tone normal, muscle strength 5/5 throughout.  Sensation to light touch intact.  Deep tendon reflexes 2+ throughout, toes downgoing.  Finger to nose testing intact.  Gait normal, Romberg negative. ? ? ?Metta Clines, DO ? ?CC: *** ? ? ? ? ? ? ?

## 2021-06-24 ENCOUNTER — Ambulatory Visit: Payer: Medicare Other | Admitting: Neurology

## 2021-07-02 DIAGNOSIS — R32 Unspecified urinary incontinence: Secondary | ICD-10-CM | POA: Diagnosis not present

## 2021-07-02 DIAGNOSIS — R3915 Urgency of urination: Secondary | ICD-10-CM | POA: Diagnosis not present

## 2021-07-02 DIAGNOSIS — N3941 Urge incontinence: Secondary | ICD-10-CM | POA: Diagnosis not present

## 2021-07-11 DIAGNOSIS — N3941 Urge incontinence: Secondary | ICD-10-CM | POA: Diagnosis not present

## 2021-07-14 DIAGNOSIS — M25561 Pain in right knee: Secondary | ICD-10-CM | POA: Diagnosis not present

## 2021-07-23 DIAGNOSIS — B370 Candidal stomatitis: Secondary | ICD-10-CM | POA: Diagnosis not present

## 2021-07-23 DIAGNOSIS — Z5181 Encounter for therapeutic drug level monitoring: Secondary | ICD-10-CM | POA: Diagnosis not present

## 2021-07-23 DIAGNOSIS — Z01818 Encounter for other preprocedural examination: Secondary | ICD-10-CM | POA: Diagnosis not present

## 2021-07-23 DIAGNOSIS — M25561 Pain in right knee: Secondary | ICD-10-CM | POA: Diagnosis not present

## 2021-07-23 DIAGNOSIS — R030 Elevated blood-pressure reading, without diagnosis of hypertension: Secondary | ICD-10-CM | POA: Diagnosis not present

## 2021-07-23 DIAGNOSIS — E785 Hyperlipidemia, unspecified: Secondary | ICD-10-CM | POA: Diagnosis not present

## 2021-07-23 DIAGNOSIS — D649 Anemia, unspecified: Secondary | ICD-10-CM | POA: Diagnosis not present

## 2021-07-31 NOTE — H&P (Signed)
TOTAL KNEE ADMISSION H&P ? ?Patient is being admitted for right total knee arthroplasty. ? ?Subjective: ? ?Chief Complaint: Right knee pain. ? ?HPI: Rebecca Porter, 81 y.o. female has a history of pain and functional disability in the right knee due to arthritis and has failed non-surgical conservative treatments for greater than 12 weeks to include NSAID's and/or analgesics, corticosteriod injections, and activity modification. Onset of symptoms was gradual, starting  several  years ago with gradually worsening course since that time. The patient noted no past surgery on the right knee.  Patient currently rates pain in the right knee at 7 out of 10 with activity. Patient has night pain, worsening of pain with activity and weight bearing, and crepitus. Patient has evidence of  bone-on-bone arthritis in the lateral compartment with valgus deformity and bone-on-bone in the patellofemoral compartment  by imaging studies. There is no active infection. ? ?Patient Active Problem List  ? Diagnosis Date Noted  ? Dysphagia 03/17/2021  ? Elevated blood-pressure reading, without diagnosis of hypertension 03/17/2021  ? Family history of malignant neoplasm of digestive organs 03/17/2021  ? Gastroesophageal reflux disease without esophagitis 03/17/2021  ? Hyperlipidemia 03/17/2021  ? Increased frequency of urination 03/17/2021  ? Vitamin D deficiency 03/17/2021  ? 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 Intermediate stage nonexudative age-related macular degeneration of both eyes 09/27/2019  ? Retinal edema of right eye 09/27/2019  ? Osteomyelitis ([place]) 11/29/2017  ? Discitis of lumbosacral region 11/29/2017  ? Acute kidney failure ([place]) 11/29/2017  ? Multiple drug allergies 11/29/2017  ? Depression 11/29/2017  ? UTI due to extended-spectrum beta lactamase (ESBL) producing Escherichia coli 11/29/2017  ? Bacteremia 11/29/2017  ? 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 Obesity 11/26/2017  ? Back pain 11/24/2017  ? Degeneration of lumbar  intervertebral disc 11/24/2017  ? Loss of appetite 11/12/2017  ? Thrombocytopenia ([place]) 11/07/2017  ? Abnormal LFTs 11/06/2017  ? Nephrolithiasis 11/06/2017  ? 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 UTI (urinary tract infection) 11/06/2017  ? Primary osteoarthritis of right knee 07/16/2017  ? ? ?Past Medical History:  ?Diagnosis Date  ? Cancer [facility])   ? breast cancer 2002   ? Dyspnea   ? with exertion   ? GERD (gastroesophageal reflux disease)   ? History of kidney stones   ? Personal history of radiation therapy 2002  ? Sepsis ([place])   ? due to kidney stone blocking the flow per pt. family  10-2017  IV INVANZ  ? ? ?Past Surgical History:  ?Procedure Laterality Date  ? BREAST BIOPSY Left   ? malignant stereo   ? BREAST LUMPECTOMY Left 2002  ? CHOLECYSTECTOMY    ? 1997  ? CYSTOSCOPY    ? Left stone removal 12-31-17  ? CYSTOSCOPY/URETEROSCOPY/HOLMIUM LASER/STENT PLACEMENT Left 01/10/2018  ? Procedure: CYSTOSCOPY, URETEROSCOPY,RETROGRADE PYLEGRAM,BASKET STONE EXTRACTION, AND  STENT REMOVAL;  Surgeon: Kathie Rhodes, MD;  Location: [facility];  Service: Urology;  Laterality: Left;  ? IR LUMBAR [place] W/IMG GUIDE  11/29/2017  ? abscess on spine  between L5 and S1   On IV Invanz   ? RENAL ARTERY STENT    ? left November 07 2017  ? ? ?Prior to Admission medications   ?Medication Sig Start Date End Date Taking? Authorizing Provider  ?Artificial Saliva (BIOTENE MOISTURIZING MOUTH MT) Biotene Moisturizing Mouth mucosal spray ? USE 1 SPRAY(S) IN MOUTH ONCE DAILY AS NEEDED FOR MOUTH THROAT    [provider]  ?atorvastatin (LIPITOR) 20 MG tablet Take 2 tablets (  40 mg total) by mouth daily. 03/27/21   Troy Sine, MD  ?hydrochlorothiazide (HYDRODIURIL) 25 MG tablet Take 0.5 tablets (12.5 mg total) by mouth as needed (for swelling). 03/27/21   Troy Sine, MD  ?metoprolol succinate (TOPROL-XL) 25 MG 24 hr tablet Take 1 tablet by mouth once daily 03/11/21   Troy Sine, MD  ?naproxen (NAPROSYN) 250 MG  tablet Take by mouth 2 (two) times daily with a meal.    [provider]  ?oxybutynin (DITROPAN XL) 15 MG 24 hr tablet Take 15 mg by mouth daily. 12/07/18   [provider]  ?traMADol (ULTRAM) 50 MG tablet Take by mouth every 6 (six) hours as needed. ?Patient not taking: Reported on 03/27/2021    [provider]  ?trimethoprim (TRIMPEX) 100 MG tablet Alternate 100 mg and 50 mg every other day.    [provider]  ? ? ?Allergies  ?Allergen Reactions  ? Ciprofloxacin Hcl Swelling and Rash  ?  Throat swelling  ? Penicillins Anaphylaxis, Hives and Other (See Comments)  ?  Has patient had a PCN reaction causing immediate rash, facial/tongue/throat swelling, SOB or lightheadedness with hypotension: Yes ?Has patient had a PCN reaction causing severe rash involving mucus membranes or skin necrosis: No ?Has patient had a PCN reaction that required hospitalization: unknown ?Has patient had a PCN reaction occurring within the last 10 years: No ?If all of the above answers are "NO", then may proceed with Cephalosporin use.  ? Ciprofloxacin Other (See Comments)  ? Macrobid [Nitrofurantoin Macrocrystal] Other (See Comments)  ?  Unknown reaction  ? Nitrofurantoin Other (See Comments)  ? Protonix [Pantoprazole Sodium] Other (See Comments)  ?  Unknown reaction  ? ? ?Social History  ? ?Socioeconomic History  ? Marital status: Widowed  ?  Spouse name: Not on file  ? Number of children: Not on file  ? Years of education: Not on file  ? Highest education level: Not on file  ?Occupational History  ? Not on file  ?Tobacco Use  ? Smoking status: Never  ? Smokeless tobacco: Never  ?Vaping Use  ? Vaping Use: Never used  ?Substance and Sexual Activity  ? Alcohol use: Never  ? Drug use: Never  ? Sexual activity: Not Currently  ?Other Topics Concern  ? Not on file  ?Social History Narrative  ? Right handed  ? No caffeine  ? One story home  ? ?Social Determinants of Health  ? ?Financial Resource Strain: Not on  file  ?Food Insecurity: Not on file  ?Transportation Needs: Not on file  ?Physical Activity: Not on file  ?Stress: Not on file  ?Social Connections: Not on file  ?Intimate Partner Violence: Not on file  ? ? ?Tobacco Use: Low Risk   ? Smoking Tobacco Use: Never  ? Smokeless Tobacco Use: Never  ? Passive Exposure: Not on file  ? ?Social History  ? ?Substance and Sexual Activity  ?Alcohol Use Never  ? ? ?Family History  ?Problem Relation Age of Onset  ? Breast cancer Neg Hx   ? ? ?Review of Systems  ?Constitutional:  Negative for chills and fever.  ?HENT: Negative.    ?Eyes: Negative.   ?Respiratory:  Negative for cough and shortness of breath.   ?Cardiovascular:  Negative for chest pain and palpitations.  ?Gastrointestinal:  Negative for abdominal pain, constipation, diarrhea, nausea and vomiting.  ?Genitourinary:  Negative for dysuria, frequency and urgency.  ?Musculoskeletal:  Positive for joint pain.  ?Skin:  Negative  for rash.  ? ?Objective: ? ?Physical Exam: ?Well nourished and well developed.  ?General: Alert and oriented x3, cooperative and pleasant, no acute distress.  ?Head: normocephalic, atraumatic, neck supple.  ?Eyes: EOMI.  ?Respiratory: breath sounds clear in all fields, no wheezing, rales, or rhonchi. ?Cardiovascular: Regular rate and rhythm, no murmurs, gallops or rubs.  ?Abdomen: non-tender to palpation and soft, normoactive bowel sounds. ?Musculoskeletal: ? ?Right Knee Exam:  ? Valgus deformity.  ? No effusion present. No swelling present.  ? The range of motion is: 0 to 125 degrees.  ? Moderate crepitus on range of motion of the knee.  ? No medial joint line tenderness.  ? Positive lateral joint line tenderness.  ? The knee is stable. ? ?Calves soft and nontender. Motor function intact in LE. Strength 5/5 LE bilaterally. ?Neuro: Distal pulses 2+. Sensation to light touch intact in LE. ? ?Vital signs in last 24 hours: ?BP: ()/()  ?Arterial Line BP: ()/()  ? ?Imaging Review ?Plain radiographs  demonstrate moderate degenerative joint disease of the right knee. The overall alignment is mild valgus. The bone quality appears to be adequate for age and reported activity level. ? ?Assessment/Plan: ? ?End stage arthr

## 2021-08-06 DIAGNOSIS — N3941 Urge incontinence: Secondary | ICD-10-CM | POA: Diagnosis not present

## 2021-08-11 NOTE — Progress Notes (Addendum)
Anesthesia Review: ? ?PCP: DR Blane Ohara- Clearance 07/28/21 on chart  ?Cardiologist : dr Corky Downs [place] dated 04/15/21 by Desiree Hane in epic LOV with DR Claiborne Billings 03/27/21  ?Neurology- [place] DR Tomi Likens- 12/06/20  ?Chest x-ray : ?EKG : 03/27/21  ?Vasc- 12/20/20  ?Monitor- 08/09/20  ?Echo : 12/31/20  ?Stress test: ?Cardiac Cath :  ?Activity level: can do a flight of stairs without difficulty  ?Sleep Study/ CPAP : none  ?Fasting Blood Sugar :      / Checks Blood Sugar -- times a day:   ?Blood Thinner/ Instructions /Last Dose: ?ASA / Instructions/ Last Dose :  ?PT was approximately 30 minutes late for preop appt.  Medical hx and preop instructions completed.   ?

## 2021-08-11 NOTE — Progress Notes (Signed)
DUE TO COVID-19 ONLY  2  VISITOR IS ALLOWED TO COME WITH YOU AND STAY IN THE WAITING ROOM ONLY DURING PRE OP AND PROCEDURE DAY OF SURGERY.   4  VISITOR  MAY VISIT WITH YOU AFTER SURGERY IN YOUR PRIVATE ROOM DURING VISITING HOURS ONLY! ?YOU MAY HAVE ONE PERSON SPEND THE NITE WITH YOU IN YOUR ROOM AFTER SURGERY.   ? ? Your procedure is scheduled on:  ?              08/25/2021  ? Report to [facility] Main  Entrance ? ? Report to admitting at     0645             AM ?DO NOT BRING INSURANCE CARD, PICTURE ID OR WALLET DAY OF SURGERY.  ?  ? ? Call this number if you have problems the morning of surgery [phone]  ? ? REMEMBER: NO  SOLID FOODS , CANDY, GUM OR MINTS AFTER MIDNITE THE NITE BEFORE SURGERY .       Marland Kitchen CLEAR LIQUIDS UNTIL      0615am           DAY OF SURGERY.      PLEASE FINISH ENSURE DRINK PER SURGEON ORDER  WHICH NEEDS TO BE COMPLETED AT     0615am     MORNING OF SURGERY.   ? ? ? ? ?CLEAR LIQUID DIET ? ? ?Foods Allowed      ?WATER ?BLACK COFFEE ( SUGAR OK, NO MILK, CREAM OR CREAMER) REGULAR AND DECAF  ?TEA ( SUGAR OK NO MILK, CREAM, OR CREAMER) REGULAR AND DECAF  ?PLAIN JELLO ( NO RED)  ?FRUIT ICES ( NO RED, NO FRUIT PULP)  ?POPSICLES ( NO RED)  ?JUICE- APPLE, WHITE GRAPE AND WHITE CRANBERRY  ?SPORT DRINK LIKE GATORADE ( NO RED)  ?CLEAR BROTH ( VEGETABLE , CHICKEN OR BEEF)                                                               ? ?    ? ?BRUSH YOUR TEETH MORNING OF SURGERY AND RINSE YOUR MOUTH OUT, NO CHEWING GUM CANDY OR MINTS. ?  ? ? Take these medicines the morning of surgery with A SIP OF WATER:  ?Toprol, ditropan  ? ? ?DO NOT TAKE ANY DIABETIC MEDICATIONS DAY OF YOUR SURGERY ?                  ?            You may not have any metal on your body including hair pins and  ?            piercings  Do not wear jewelry, make-up, lotions, powders or perfumes, deodorant ?            Do not wear nail polish on your fingernails.   ?           IF YOU ARE A FEMALE AND WANT TO SHAVE UNDER ARMS OR LEGS  PRIOR TO SURGERY YOU MUST DO SO AT LEAST 48 HOURS PRIOR TO SURGERY.  ?            Men may shave face and neck. ? ? Do not bring valuables to the hospital. [facility] NOT ?  RESPONSIBLE   FOR VALUABLES. ? Contacts, dentures or bridgework may not be worn into surgery. ? Leave suitcase in the car. After surgery it may be brought to your room. ? ?  ? Patients discharged the day of surgery will not be allowed to drive home. IF YOU ARE HAVING SURGERY AND GOING HOME THE SAME DAY, YOU MUST HAVE AN ADULT TO DRIVE YOU HOME AND BE WITH YOU FOR 24 HOURS. YOU MAY GO HOME BY TAXI OR UBER OR ORTHERWISE, BUT AN ADULT MUST ACCOMPANY YOU HOME AND STAY WITH YOU FOR 24 HOURS. ?  ? ?            Please read over the following fact sheets you were given: ?_____________________________________________________________________ ? ?[facility] - Preparing for Surgery ?Before surgery, you can play an important role.  Because skin is not sterile, your skin needs to be as free of germs as possible.  You can reduce the number of germs on your skin by washing with CHG (chlorahexidine gluconate) soap before surgery.  CHG is an antiseptic cleaner which kills germs and bonds with the skin to continue killing germs even after washing. ?Please DO NOT use if you have an allergy to CHG or antibacterial soaps.  If your skin becomes reddened/irritated stop using the CHG and inform your nurse when you arrive at Short Stay. ?Do not shave (including legs and underarms) for at least 48 hours prior to the first CHG shower.  You may shave your face/neck. ?Please follow these instructions carefully: ? 1.  Shower with CHG Soap the night before surgery and the  morning of Surgery. ? 2.  If you choose to wash your hair, wash your hair first as usual with your  normal  shampoo. ? 3.  After you shampoo, rinse your hair and body thoroughly to remove the  shampoo.                           4.  Use CHG as you would any other liquid soap.  You can apply chg  directly  to the skin and wash  ?                     Gently with a scrungie or clean washcloth. ? 5.  Apply the CHG Soap to your body ONLY FROM THE NECK DOWN.   Do not use on face/ open      ?                     Wound or open sores. Avoid contact with eyes, ears mouth and genitals (private parts).  ?                     Production manager,  Genitals (private parts) with your normal soap. ?            6.  Wash thoroughly, paying special attention to the area where your surgery  will be performed. ? 7.  Thoroughly rinse your body with warm water from the neck down. ? 8.  DO NOT shower/wash with your normal soap after using and rinsing off  the CHG Soap. ?               9.  Pat yourself dry with a clean towel. ?           10.  Wear clean pajamas. ?  11.  Place clean sheets on your bed the night of your first shower and do not  sleep with pets. ?Day of Surgery : ?Do not apply any lotions/deodorants the morning of surgery.  Please wear clean clothes to the hospital/surgery center. ? ?FAILURE TO FOLLOW THESE INSTRUCTIONS MAY RESULT IN THE CANCELLATION OF YOUR SURGERY ?PATIENT SIGNATURE_________________________________ ? ?NURSE SIGNATURE__________________________________ ? ?________________________________________________________________________  ? ? ?           ?

## 2021-08-11 NOTE — Progress Notes (Signed)
DUE TO COVID-19 ONLY ONE VISITOR IS ALLOWED TO COME WITH YOU AND STAY IN THE WAITING ROOM ONLY DURING PRE OP AND PROCEDURE DAY OF SURGERY.  2 VISITOR  MAY VISIT WITH YOU AFTER SURGERY IN YOUR PRIVATE ROOM DURING VISITING HOURS ONLY! ?YOU MAY HAVE ONE PERSON SPEND THE NITE WITH YOU IN YOUR ROOM AFTER SURGERY.   ? ?  ? ? Your procedure is scheduled on:  ?        08/25/2021  ? Report to [facility] Main  Entrance ? ? Report to admitting at    0645am             AM ?DO NOT BRING INSURANCE CARD, PICTURE ID OR WALLET DAY OF SURGERY.  ?  ? ? Call this number if you have problems the morning of surgery [phone]  ? ? REMEMBER: NO  SOLID FOODS , CANDY, GUM OR MINTS AFTER MIDNITE THE NITE BEFORE SURGERY .       Marland Kitchen CLEAR LIQUIDS UNTIL   0615am             DAY OF SURGERY.      PLEASE FINISH ENSURE DRINK PER SURGEON ORDER  WHICH NEEDS TO BE COMPLETED AT   0615 am       MORNING OF SURGERY.   ? ? ? ? ?CLEAR LIQUID DIET ? ? ?Foods Allowed      ?WATER ?BLACK COFFEE ( SUGAR OK, NO MILK, CREAM OR CREAMER) REGULAR AND DECAF  ?TEA ( SUGAR OK NO MILK, CREAM, OR CREAMER) REGULAR AND DECAF  ?PLAIN JELLO ( NO RED)  ?FRUIT ICES ( NO RED, NO FRUIT PULP)  ?POPSICLES ( NO RED)  ?JUICE- APPLE, WHITE GRAPE AND WHITE CRANBERRY  ?SPORT DRINK LIKE GATORADE ( NO RED)  ?CLEAR BROTH ( VEGETABLE , CHICKEN OR BEEF)                                                               ? ?    ? ?BRUSH YOUR TEETH MORNING OF SURGERY AND RINSE YOUR MOUTH OUT, NO CHEWING GUM CANDY OR MINTS. ?  ? ? Take these medicines the morning of surgery with A SIP OF WATER:  toprol, ditropan  ? ? ?DO NOT TAKE ANY DIABETIC MEDICATIONS DAY OF YOUR SURGERY ?                  ?            You may not have any metal on your body including hair pins and  ?            piercings  Do not wear jewelry, make-up, lotions, powders or perfumes, deodorant ?            Do not wear nail polish on your fingernails.   ?           IF YOU ARE A FEMALE AND WANT TO SHAVE UNDER ARMS OR LEGS  PRIOR TO SURGERY YOU MUST DO SO AT LEAST 48 HOURS PRIOR TO SURGERY.  ?            Men may shave face and neck. ? ? Do not bring valuables to the hospital. [facility] NOT ?  RESPONSIBLE   FOR VALUABLES. ? Contacts, dentures or bridgework may not be worn into surgery. ? Leave suitcase in the car. After surgery it may be brought to your room. ? ?  ? Patients discharged the day of surgery will not be allowed to drive home. IF YOU ARE HAVING SURGERY AND GOING HOME THE SAME DAY, YOU MUST HAVE AN ADULT TO DRIVE YOU HOME AND BE WITH YOU FOR 24 HOURS. YOU MAY GO HOME BY TAXI OR UBER OR ORTHERWISE, BUT AN ADULT MUST ACCOMPANY YOU HOME AND STAY WITH YOU FOR 24 HOURS. ?  ? ?            Please read over the following fact sheets you were given: ?_____________________________________________________________________ ? ?[facility] - Preparing for Surgery ?Before surgery, you can play an important role.  Because skin is not sterile, your skin needs to be as free of germs as possible.  You can reduce the number of germs on your skin by washing with CHG (chlorahexidine gluconate) soap before surgery.  CHG is an antiseptic cleaner which kills germs and bonds with the skin to continue killing germs even after washing. ?Please DO NOT use if you have an allergy to CHG or antibacterial soaps.  If your skin becomes reddened/irritated stop using the CHG and inform your nurse when you arrive at Short Stay. ?Do not shave (including legs and underarms) for at least 48 hours prior to the first CHG shower.  You may shave your face/neck. ?Please follow these instructions carefully: ? 1.  Shower with CHG Soap the night before surgery and the  morning of Surgery. ? 2.  If you choose to wash your hair, wash your hair first as usual with your  normal  shampoo. ? 3.  After you shampoo, rinse your hair and body thoroughly to remove the  shampoo.                           4.  Use CHG as you would any other liquid soap.  You can apply chg  directly  to the skin and wash  ?                     Gently with a scrungie or clean washcloth. ? 5.  Apply the CHG Soap to your body ONLY FROM THE NECK DOWN.   Do not use on face/ open      ?                     Wound or open sores. Avoid contact with eyes, ears mouth and genitals (private parts).  ?                     Production manager,  Genitals (private parts) with your normal soap. ?            6.  Wash thoroughly, paying special attention to the area where your surgery  will be performed. ? 7.  Thoroughly rinse your body with warm water from the neck down. ? 8.  DO NOT shower/wash with your normal soap after using and rinsing off  the CHG Soap. ?               9.  Pat yourself dry with a clean towel. ?           10.  Wear clean pajamas. ?  11.  Place clean sheets on your bed the night of your first shower and do not  sleep with pets. ?Day of Surgery : ?Do not apply any lotions/deodorants the morning of surgery.  Please wear clean clothes to the hospital/surgery center. ? ?FAILURE TO FOLLOW THESE INSTRUCTIONS MAY RESULT IN THE CANCELLATION OF YOUR SURGERY ?PATIENT SIGNATURE_________________________________ ? ?NURSE SIGNATURE__________________________________ ? ?________________________________________________________________________  ? ? ?           ?

## 2021-08-12 ENCOUNTER — Encounter (HOSPITAL_COMMUNITY)
Admission: RE | Admit: 2021-08-12 | Discharge: 2021-08-12 | Disposition: A | Discharge status: Home / Self Care | Payer: Medicare Other | Source: Ambulatory Visit | Attending: Orthopedic Surgery | Admitting: Orthopedic Surgery

## 2021-08-12 ENCOUNTER — Other Ambulatory Visit: Payer: Self-pay

## 2021-08-12 ENCOUNTER — Encounter (HOSPITAL_COMMUNITY): Payer: Self-pay

## 2021-08-12 VITALS — BP 135/78 | HR 73 | Temp 98.0°F | Resp 16 | Ht 66.5 in | Wt 217.0 lb

## 2021-08-12 DIAGNOSIS — Z01818 Encounter for other preprocedural examination: Secondary | ICD-10-CM

## 2021-08-12 DIAGNOSIS — M1711 Unilateral primary osteoarthritis, right knee: Secondary | ICD-10-CM | POA: Insufficient documentation

## 2021-08-12 DIAGNOSIS — Z01812 Encounter for preprocedural laboratory examination: Secondary | ICD-10-CM | POA: Insufficient documentation

## 2021-08-12 HISTORY — DX: Chronic kidney disease, unspecified: N18.9

## 2021-08-12 HISTORY — DX: Essential (primary) hypertension: I10

## 2021-08-12 HISTORY — DX: Transient cerebral ischemic attack, unspecified: G45.9

## 2021-08-12 HISTORY — DX: Unspecified osteoarthritis, unspecified site: M19.90

## 2021-08-12 LAB — CBC
HCT: 37.7 % (ref 36.0–46.0)
Hemoglobin: 12.2 g/dL (ref 12.0–15.0)
MCH: 30.5 pg (ref 26.0–34.0)
MCHC: 32.4 g/dL (ref 30.0–36.0)
MCV: 94.3 fL (ref 80.0–100.0)
Platelets: 223 10*3/uL (ref 150–400)
RBC: 4 MIL/uL (ref 3.87–5.11)
RDW: 14.8 % (ref 11.5–15.5)
WBC: 6.4 10*3/uL (ref 4.0–10.5)
nRBC: 0 % (ref 0.0–0.2)

## 2021-08-12 LAB — PROTIME-INR
INR: 1 (ref 0.8–1.2)
Prothrombin Time: 13.1 seconds (ref 11.4–15.2)

## 2021-08-12 LAB — COMPREHENSIVE METABOLIC PANEL
ALT: 12 U/L (ref 0–44)
AST: 17 U/L (ref 15–41)
Albumin: 3.6 g/dL (ref 3.5–5.0)
Alkaline Phosphatase: 87 U/L (ref 38–126)
Anion gap: 5 (ref 5–15)
BUN: 20 mg/dL (ref 8–23)
CO2: 27 mmol/L (ref 22–32)
Calcium: 9.6 mg/dL (ref 8.9–10.3)
Chloride: 108 mmol/L (ref 98–111)
Creatinine, Ser: 1.15 mg/dL — ABNORMAL HIGH (ref 0.44–1.00)
GFR, Estimated: 48 mL/min — ABNORMAL LOW (ref >60)
Glucose, Bld: 108 mg/dL — ABNORMAL HIGH (ref 70–99)
Potassium: 3.9 mmol/L (ref 3.5–5.1)
Sodium: 140 mmol/L (ref 135–145)
Total Bilirubin: 0.9 mg/dL (ref 0.3–1.2)
Total Protein: 7.2 g/dL (ref 6.5–8.1)

## 2021-08-12 LAB — SURGICAL PCR SCREEN
MRSA, PCR: NEGATIVE
Staphylococcus aureus: NEGATIVE

## 2021-08-14 DIAGNOSIS — R8271 Bacteriuria: Secondary | ICD-10-CM | POA: Diagnosis not present

## 2021-08-14 DIAGNOSIS — N3000 Acute cystitis without hematuria: Secondary | ICD-10-CM | POA: Diagnosis not present

## 2021-08-14 NOTE — Anesthesia Preprocedure Evaluation (Addendum)
Anesthesia Evaluation  ?Patient identified by MRN, date of birth, ID band ?Patient awake ? ? ? ?Reviewed: ?Allergy & Precautions, NPO status , Patient's Chart, lab work & pertinent test results ? ?Airway ?Mallampati: II ? ?TM Distance: >3 FB ?Neck ROM: Full ? ? ? Dental ? ?(+) Dental Advisory Given ?  ?Pulmonary ?neg pulmonary ROS,  ?  ?breath sounds clear to auscultation ? ? ? ? ? ? Cardiovascular ?hypertension, Pt. on medications ? ?Rhythm:Regular Rate:Normal ? ? ?  ?Neuro/Psych ?TIA  ? GI/Hepatic ?Neg liver ROS, GERD  ,  ?Endo/Other  ?negative endocrine ROS ? Renal/GU ?Renal disease  ? ?  ?Musculoskeletal ? ?(+) Arthritis ,  ? Abdominal ?  ?Peds ? Hematology ?negative hematology ROS ?(+)   ?Anesthesia Other Findings ? ? Reproductive/Obstetrics ? ?  ? ? ? ? ? ? ? ? ? ? ? ? ? ?  ?  ? ? ? ? ? ? ? ?Lab Results  ?Component Value Date  ? WBC 6.4 08/12/2021  ? HGB 12.2 08/12/2021  ? HCT 37.7 08/12/2021  ? MCV 94.3 08/12/2021  ? PLT 223 08/12/2021  ? ?Lab Results  ?Component Value Date  ? CREATININE 1.15 (H) 08/12/2021  ? BUN 20 08/12/2021  ? NA 140 08/12/2021  ? K 3.9 08/12/2021  ? CL 108 08/12/2021  ? CO2 27 08/12/2021  ? ? ?Anesthesia Physical ?Anesthesia Plan ? ?ASA: 3 ? ?Anesthesia Plan: Spinal  ? ?Post-op Pain Management: Regional block* and Ofirmev IV (intra-op)*  ? ?Induction:  ? ?PONV Risk Score and Plan: 2 and Propofol infusion and Ondansetron ? ?Airway Management Planned:  ? ?Additional Equipment:  ? ?Intra-op Plan:  ? ?Post-operative Plan:  ? ?Informed Consent: I have reviewed the patients History and Physical, chart, labs and discussed the procedure including the risks, benefits and alternatives for the proposed anesthesia with the patient or authorized representative who has indicated his/her understanding and acceptance.  ? ? ? ? ? ?Plan Discussed with:  ? ?Anesthesia Plan Comments: (Per cardiology preoperative evaluation, "Chart reviewed as part of pre-operative protocol  coverage. Patient was contacted 04/16/2021 in reference to pre-operative risk assessment for pending surgery as outlined below.  Rebecca Porter was last seen on 03/27/2021 by Dr. Claiborne Billings.  Since that day, Rebecca Porter has done well without exertional chest pain or worsening dyspnea. ?? ?Therefore, based on ACC/AHA guidelines, the patient would be at acceptable risk for the planned procedure without further cardiovascular testing.")  ? ? ? ? ? ?Anesthesia Quick Evaluation ? ?

## 2021-08-25 ENCOUNTER — Other Ambulatory Visit: Payer: Self-pay

## 2021-08-25 ENCOUNTER — Encounter (HOSPITAL_COMMUNITY): Payer: Self-pay | Admitting: Orthopedic Surgery

## 2021-08-25 ENCOUNTER — Ambulatory Visit (HOSPITAL_BASED_OUTPATIENT_CLINIC_OR_DEPARTMENT_OTHER): Payer: Medicare Other | Admitting: Registered Nurse

## 2021-08-25 ENCOUNTER — Observation Stay (HOSPITAL_COMMUNITY)
Admission: RE | Admit: 2021-08-25 | Discharge: 2021-08-26 | Disposition: A | Discharge status: Home / Self Care | Payer: Medicare Other | Attending: Orthopedic Surgery | Admitting: Orthopedic Surgery

## 2021-08-25 ENCOUNTER — Ambulatory Visit (HOSPITAL_COMMUNITY): Payer: Medicare Other | Admitting: Physician Assistant

## 2021-08-25 ENCOUNTER — Encounter (HOSPITAL_COMMUNITY)
Admission: RE | Disposition: A | Discharge status: Home / Self Care | Payer: Self-pay | Source: Home / Self Care | Attending: Orthopedic Surgery

## 2021-08-25 DIAGNOSIS — I129 Hypertensive chronic kidney disease with stage 1 through stage 4 chronic kidney disease, or unspecified chronic kidney disease: Secondary | ICD-10-CM | POA: Diagnosis not present

## 2021-08-25 DIAGNOSIS — Z853 Personal history of malignant neoplasm of breast: Secondary | ICD-10-CM | POA: Insufficient documentation

## 2021-08-25 DIAGNOSIS — M1711 Unilateral primary osteoarthritis, right knee: Secondary | ICD-10-CM | POA: Diagnosis not present

## 2021-08-25 DIAGNOSIS — Z8673 Personal history of transient ischemic attack (TIA), and cerebral infarction without residual deficits: Secondary | ICD-10-CM | POA: Diagnosis not present

## 2021-08-25 DIAGNOSIS — N289 Disorder of kidney and ureter, unspecified: Secondary | ICD-10-CM

## 2021-08-25 DIAGNOSIS — Z79899 Other long term (current) drug therapy: Secondary | ICD-10-CM | POA: Insufficient documentation

## 2021-08-25 DIAGNOSIS — M1712 Unilateral primary osteoarthritis, left knee: Secondary | ICD-10-CM

## 2021-08-25 DIAGNOSIS — N189 Chronic kidney disease, unspecified: Secondary | ICD-10-CM | POA: Diagnosis not present

## 2021-08-25 DIAGNOSIS — G8918 Other acute postprocedural pain: Secondary | ICD-10-CM | POA: Diagnosis not present

## 2021-08-25 DIAGNOSIS — I10 Essential (primary) hypertension: Secondary | ICD-10-CM | POA: Diagnosis not present

## 2021-08-25 DIAGNOSIS — M179 Osteoarthritis of knee, unspecified: Secondary | ICD-10-CM | POA: Diagnosis present

## 2021-08-25 HISTORY — PX: TOTAL KNEE ARTHROPLASTY: SHX125

## 2021-08-25 SURGERY — ARTHROPLASTY, KNEE, TOTAL
Anesthesia: Spinal | Site: Knee | Laterality: Right

## 2021-08-25 MED ORDER — PHENYLEPHRINE HCL-NACL 20-0.9 MG/250ML-% IV SOLN
INTRAVENOUS | Status: DC | PRN
  Administered 2021-08-25: 25 ug/min via INTRAVENOUS

## 2021-08-25 MED ORDER — POVIDONE-IODINE 10 % EX SWAB
2.0000 "application " | Freq: Once | CUTANEOUS | Status: AC
  Administered 2021-08-25: 2 via TOPICAL

## 2021-08-25 MED ORDER — PHENOL 1.4 % MT LIQD: 1.0000 | OROMUCOSAL | Status: DC | PRN

## 2021-08-25 MED ORDER — SODIUM CHLORIDE (PF) 0.9 % IJ SOLN
INTRAMUSCULAR | Status: AC
  Filled 2021-08-25: qty 50

## 2021-08-25 MED ORDER — DEXAMETHASONE SODIUM PHOSPHATE 10 MG/ML IJ SOLN
10.0000 mg | Freq: Once | INTRAMUSCULAR | Status: AC
  Administered 2021-08-26: 10 mg via INTRAVENOUS
  Filled 2021-08-25: qty 1

## 2021-08-25 MED ORDER — MENTHOL 3 MG MT LOZG: 1.0000 | LOZENGE | OROMUCOSAL | Status: DC | PRN

## 2021-08-25 MED ORDER — DEXAMETHASONE SODIUM PHOSPHATE 10 MG/ML IJ SOLN
INTRAMUSCULAR | Status: AC
  Filled 2021-08-25: qty 1

## 2021-08-25 MED ORDER — FENTANYL CITRATE PF 50 MCG/ML IJ SOSY
50.0000 ug | PREFILLED_SYRINGE | INTRAMUSCULAR | Status: DC
  Administered 2021-08-25: 50 ug via INTRAVENOUS
  Filled 2021-08-25: qty 2

## 2021-08-25 MED ORDER — TRAMADOL HCL 50 MG PO TABS
50.0000 mg | ORAL_TABLET | Freq: Four times a day (QID) | ORAL | Status: DC | PRN
  Administered 2021-08-25 (×2): 100 mg via ORAL
  Administered 2021-08-26: 50 mg via ORAL
  Administered 2021-08-26: 100 mg via ORAL
  Filled 2021-08-25 (×3): qty 2
  Filled 2021-08-25 (×2): qty 1

## 2021-08-25 MED ORDER — METOCLOPRAMIDE HCL 5 MG/ML IJ SOLN: 5.0000 mg | Freq: Three times a day (TID) | INTRAMUSCULAR | Status: DC | PRN

## 2021-08-25 MED ORDER — METOPROLOL SUCCINATE ER 25 MG PO TB24
25.0000 mg | ORAL_TABLET | Freq: Every day | ORAL | Status: DC
  Administered 2021-08-25: 25 mg via ORAL
  Filled 2021-08-25: qty 1

## 2021-08-25 MED ORDER — LACTATED RINGERS IV SOLN: INTRAVENOUS | Status: DC

## 2021-08-25 MED ORDER — VANCOMYCIN HCL 1500 MG/300ML IV SOLN: 1500.0000 mg | INTRAVENOUS | Status: DC

## 2021-08-25 MED ORDER — BUPIVACAINE LIPOSOME 1.3 % IJ SUSP
INTRAMUSCULAR | Status: DC | PRN
  Administered 2021-08-25: 20 mL

## 2021-08-25 MED ORDER — SODIUM CHLORIDE (PF) 0.9 % IJ SOLN
INTRAMUSCULAR | Status: DC | PRN
  Administered 2021-08-25: 60 mL

## 2021-08-25 MED ORDER — METHOCARBAMOL 1000 MG/10ML IJ SOLN: 500.0000 mg | Freq: Four times a day (QID) | INTRAVENOUS | Status: DC | PRN

## 2021-08-25 MED ORDER — DEXAMETHASONE SODIUM PHOSPHATE 10 MG/ML IJ SOLN
8.0000 mg | Freq: Once | INTRAMUSCULAR | Status: AC
  Administered 2021-08-25: 8 mg via INTRAVENOUS

## 2021-08-25 MED ORDER — BUPIVACAINE LIPOSOME 1.3 % IJ SUSP
INTRAMUSCULAR | Status: AC
  Filled 2021-08-25: qty 20

## 2021-08-25 MED ORDER — VANCOMYCIN HCL IN DEXTROSE 1-5 GM/200ML-% IV SOLN
1000.0000 mg | INTRAVENOUS | Status: AC
  Administered 2021-08-25: 1000 mg via INTRAVENOUS
  Filled 2021-08-25: qty 200

## 2021-08-25 MED ORDER — DOCUSATE SODIUM 100 MG PO CAPS
100.0000 mg | ORAL_CAPSULE | Freq: Two times a day (BID) | ORAL | Status: DC
  Administered 2021-08-25 – 2021-08-26 (×2): 100 mg via ORAL
  Filled 2021-08-25 (×2): qty 1

## 2021-08-25 MED ORDER — ACETAMINOPHEN 500 MG PO TABS
1000.0000 mg | ORAL_TABLET | Freq: Four times a day (QID) | ORAL | Status: DC
  Administered 2021-08-26: 1000 mg via ORAL
  Filled 2021-08-25 (×3): qty 2

## 2021-08-25 MED ORDER — PROPOFOL 500 MG/50ML IV EMUL
INTRAVENOUS | Status: DC | PRN
  Administered 2021-08-25: 40 ug/kg/min via INTRAVENOUS
  Administered 2021-08-25: 55 ug/kg/min via INTRAVENOUS

## 2021-08-25 MED ORDER — MORPHINE SULFATE (PF) 2 MG/ML IV SOLN: 1.0000 mg | INTRAVENOUS | Status: DC | PRN

## 2021-08-25 MED ORDER — VANCOMYCIN HCL IN DEXTROSE 1-5 GM/200ML-% IV SOLN
1000.0000 mg | Freq: Two times a day (BID) | INTRAVENOUS | Status: AC
  Administered 2021-08-25: 1000 mg via INTRAVENOUS
  Filled 2021-08-25: qty 200

## 2021-08-25 MED ORDER — PHENYLEPHRINE HCL (PRESSORS) 10 MG/ML IV SOLN
INTRAVENOUS | Status: AC
  Filled 2021-08-25: qty 1

## 2021-08-25 MED ORDER — ATORVASTATIN CALCIUM 40 MG PO TABS
40.0000 mg | ORAL_TABLET | Freq: Every day | ORAL | Status: DC
  Administered 2021-08-26: 40 mg via ORAL
  Filled 2021-08-25: qty 1

## 2021-08-25 MED ORDER — STERILE WATER FOR IRRIGATION IR SOLN
Status: DC | PRN
  Administered 2021-08-25: 2000 mL

## 2021-08-25 MED ORDER — DIPHENHYDRAMINE HCL 12.5 MG/5ML PO ELIX: 12.5000 mg | ORAL_SOLUTION | ORAL | Status: DC | PRN

## 2021-08-25 MED ORDER — ONDANSETRON HCL 4 MG PO TABS: 4.0000 mg | ORAL_TABLET | Freq: Four times a day (QID) | ORAL | Status: DC | PRN

## 2021-08-25 MED ORDER — BUPIVACAINE LIPOSOME 1.3 % IJ SUSP: 20.0000 mL | Freq: Once | INTRAMUSCULAR | Status: AC

## 2021-08-25 MED ORDER — BISACODYL 10 MG RE SUPP: 10.0000 mg | Freq: Every day | RECTAL | Status: DC | PRN

## 2021-08-25 MED ORDER — METHOCARBAMOL 500 MG PO TABS
500.0000 mg | ORAL_TABLET | Freq: Four times a day (QID) | ORAL | Status: DC | PRN
  Administered 2021-08-26: 500 mg via ORAL
  Filled 2021-08-25: qty 1

## 2021-08-25 MED ORDER — ROPIVACAINE HCL 5 MG/ML IJ SOLN
INTRAMUSCULAR | Status: DC | PRN
  Administered 2021-08-25: 20 mL via PERINEURAL

## 2021-08-25 MED ORDER — ONDANSETRON HCL 4 MG/2ML IJ SOLN
INTRAMUSCULAR | Status: DC | PRN
  Administered 2021-08-25: 4 mg via INTRAVENOUS

## 2021-08-25 MED ORDER — ONDANSETRON HCL 4 MG/2ML IJ SOLN: 4.0000 mg | Freq: Four times a day (QID) | INTRAMUSCULAR | Status: DC | PRN

## 2021-08-25 MED ORDER — RIVAROXABAN 10 MG PO TABS
10.0000 mg | ORAL_TABLET | Freq: Every day | ORAL | Status: DC
  Administered 2021-08-26: 10 mg via ORAL
  Filled 2021-08-25: qty 1

## 2021-08-25 MED ORDER — SODIUM CHLORIDE 0.9 % IR SOLN
Status: DC | PRN
Start: 2021-08-25 — End: 2021-08-25
  Administered 2021-08-25 (×2): 1000 mL

## 2021-08-25 MED ORDER — SODIUM CHLORIDE (PF) 0.9 % IJ SOLN
INTRAMUSCULAR | Status: AC
  Filled 2021-08-25: qty 10

## 2021-08-25 MED ORDER — TRANEXAMIC ACID-NACL 1000-0.7 MG/100ML-% IV SOLN
1000.0000 mg | INTRAVENOUS | Status: AC
  Administered 2021-08-25: 1000 mg via INTRAVENOUS
  Filled 2021-08-25: qty 100

## 2021-08-25 MED ORDER — METOCLOPRAMIDE HCL 5 MG PO TABS: 5.0000 mg | ORAL_TABLET | Freq: Three times a day (TID) | ORAL | Status: DC | PRN

## 2021-08-25 MED ORDER — OXYCODONE HCL 5 MG PO TABS
5.0000 mg | ORAL_TABLET | ORAL | Status: DC | PRN
  Administered 2021-08-26: 5 mg via ORAL
  Filled 2021-08-25: qty 1

## 2021-08-25 MED ORDER — FLEET ENEMA 7-19 GM/118ML RE ENEM: 1.0000 | ENEMA | Freq: Once | RECTAL | Status: DC | PRN

## 2021-08-25 MED ORDER — POLYETHYLENE GLYCOL 3350 17 G PO PACK: 17.0000 g | PACK | Freq: Every day | ORAL | Status: DC | PRN

## 2021-08-25 MED ORDER — ONDANSETRON HCL 4 MG/2ML IJ SOLN
INTRAMUSCULAR | Status: AC
  Filled 2021-08-25: qty 2

## 2021-08-25 MED ORDER — ACETAMINOPHEN 10 MG/ML IV SOLN
1000.0000 mg | Freq: Once | INTRAVENOUS | Status: AC
  Administered 2021-08-25: 1000 mg via INTRAVENOUS
  Filled 2021-08-25: qty 100

## 2021-08-25 MED ORDER — SODIUM CHLORIDE 0.9 % IV SOLN: INTRAVENOUS | Status: DC

## 2021-08-25 MED ORDER — PROPOFOL 1000 MG/100ML IV EMUL
INTRAVENOUS | Status: AC
  Filled 2021-08-25: qty 100

## 2021-08-25 MED ORDER — OXYBUTYNIN CHLORIDE ER 5 MG PO TB24
15.0000 mg | ORAL_TABLET | Freq: Every day | ORAL | Status: DC
  Administered 2021-08-25: 15 mg via ORAL
  Filled 2021-08-25: qty 3

## 2021-08-25 MED ORDER — BUPIVACAINE IN DEXTROSE 0.75-8.25 % IT SOLN
INTRATHECAL | Status: DC | PRN
  Administered 2021-08-25: 1.6 mL via INTRATHECAL

## 2021-08-25 SURGICAL SUPPLY — 58 items
ATTUNE PS FEM RT SZ 5 CEM KNEE (Femur) ×1 IMPLANT
ATTUNE PSRP INSR SZ 5 10M KNEE (Insert) ×1 IMPLANT
BAG COUNTER SPONGE SURGICOUNT (BAG) ×1 IMPLANT
BAG ZIPLOCK 12X15 (MISCELLANEOUS) ×2 IMPLANT
BASE TIBIA ATTUNE KNEE SYS SZ6 (Knees) IMPLANT
BLADE SAG 18X100X1.27 (BLADE) ×2 IMPLANT
BLADE SAW SGTL 11.0X1.19X90.0M (BLADE) ×2 IMPLANT
BNDG ELASTIC 6X5.8 VLCR STR LF (GAUZE/BANDAGES/DRESSINGS) ×2 IMPLANT
BOWL SMART MIX CTS (DISPOSABLE) ×2 IMPLANT
CEMENT HV SMART SET (Cement) ×4 IMPLANT
CLSR STERI-STRIP ANTIMIC 1/2X4 (GAUZE/BANDAGES/DRESSINGS) ×1 IMPLANT
COVER SURGICAL LIGHT HANDLE (MISCELLANEOUS) ×2 IMPLANT
CUFF TOURN SGL QUICK 34 (TOURNIQUET CUFF) ×2
CUFF TRNQT CYL 34X4.125X (TOURNIQUET CUFF) ×1 IMPLANT
DRAPE INCISE IOBAN 66X45 STRL (DRAPES) ×2 IMPLANT
DRAPE U-SHAPE 47X51 STRL (DRAPES) ×2 IMPLANT
DRSG AQUACEL AG ADV 3.5X10 (GAUZE/BANDAGES/DRESSINGS) ×2 IMPLANT
DURAPREP 26ML APPLICATOR (WOUND CARE) ×2 IMPLANT
ELECT REM PT RETURN 15FT ADLT (MISCELLANEOUS) ×2 IMPLANT
GLOVE BIO SURGEON STRL SZ 6.5 (GLOVE) ×3 IMPLANT
GLOVE BIO SURGEON STRL SZ7.5 (GLOVE) IMPLANT
GLOVE BIO SURGEON STRL SZ8 (GLOVE) ×2 IMPLANT
GLOVE BIOGEL PI IND STRL 6.5 (GLOVE) IMPLANT
GLOVE BIOGEL PI IND STRL 7.0 (GLOVE) IMPLANT
GLOVE BIOGEL PI IND STRL 8 (GLOVE) ×1 IMPLANT
GLOVE BIOGEL PI INDICATOR 6.5 (GLOVE)
GLOVE BIOGEL PI INDICATOR 7.0 (GLOVE) ×3
GLOVE BIOGEL PI INDICATOR 8 (GLOVE) ×1
GOWN STRL REUS W/ TWL LRG LVL3 (GOWN DISPOSABLE) ×1 IMPLANT
GOWN STRL REUS W/ TWL XL LVL3 (GOWN DISPOSABLE) IMPLANT
GOWN STRL REUS W/TWL LRG LVL3 (GOWN DISPOSABLE) ×2
GOWN STRL REUS W/TWL XL LVL3 (GOWN DISPOSABLE) ×6
HANDPIECE INTERPULSE COAX TIP (DISPOSABLE) ×2
HOLDER FOLEY CATH W/STRAP (MISCELLANEOUS) ×1 IMPLANT
IMMOBILIZER KNEE 20 (SOFTGOODS) ×2
IMMOBILIZER KNEE 20 THIGH 36 (SOFTGOODS) ×1 IMPLANT
KIT TURNOVER KIT A (KITS) ×1 IMPLANT
MANIFOLD NEPTUNE II (INSTRUMENTS) ×2 IMPLANT
NS IRRIG 1000ML POUR BTL (IV SOLUTION) ×2 IMPLANT
PACK TOTAL KNEE CUSTOM (KITS) ×2 IMPLANT
PADDING CAST COTTON 6X4 STRL (CAST SUPPLIES) ×4 IMPLANT
PADDING CAST SYN 6 (CAST SUPPLIES) ×1
PADDING CAST SYNTHETIC 6X4 NS (CAST SUPPLIES) IMPLANT
PATELLA MEDIAL ATTUN 35MM KNEE (Knees) ×1 IMPLANT
PROTECTOR NERVE ULNAR (MISCELLANEOUS) ×2 IMPLANT
SET HNDPC FAN SPRY TIP SCT (DISPOSABLE) ×1 IMPLANT
SPIKE FLUID TRANSFER (MISCELLANEOUS) ×2 IMPLANT
STRIP CLOSURE SKIN 1/2X4 (GAUZE/BANDAGES/DRESSINGS) ×4 IMPLANT
SUT MNCRL AB 4-0 PS2 18 (SUTURE) ×2 IMPLANT
SUT STRATAFIX 0 PDS 27 VIOLET (SUTURE) ×2
SUT VIC AB 2-0 CT1 27 (SUTURE) ×6
SUT VIC AB 2-0 CT1 TAPERPNT 27 (SUTURE) ×3 IMPLANT
SUTURE STRATFX 0 PDS 27 VIOLET (SUTURE) ×1 IMPLANT
TIBIA ATTUNE KNEE SYS BASE SZ6 (Knees) ×2 IMPLANT
TRAY FOLEY MTR SLVR 16FR STAT (SET/KITS/TRAYS/PACK) ×2 IMPLANT
TUBE SUCTION HIGH CAP CLEAR NV (SUCTIONS) ×2 IMPLANT
WATER STERILE IRR 1000ML POUR (IV SOLUTION) ×4 IMPLANT
WRAP KNEE MAXI GEL POST OP (GAUZE/BANDAGES/DRESSINGS) ×2 IMPLANT

## 2021-08-25 NOTE — Progress Notes (Signed)
Orthopedic Tech Progress Note ?Patient Details:  ?Rebecca Porter ?1940-09-12 ?[id number] ? ?CPM Right Knee ?CPM Right Knee: On ?Right Knee Flexion (Degrees): 40 ?Right Knee Extension (Degrees): 10 ? ?Post Interventions ?Patient Tolerated: Well ?Instructions Provided: Care of device, Adjustment of device ? ?[name] ?08/25/2021, 11:02 AM ? ?

## 2021-08-25 NOTE — Anesthesia Postprocedure Evaluation (Signed)
Anesthesia Post Note ? ?Patient: Rebecca Porter ? ?Procedure(s) Performed: TOTAL KNEE ARTHROPLASTY (Right: Knee) ? ?  ? ?Patient location during evaluation: PACU ?Anesthesia Type: Spinal ?Level of consciousness: awake and alert ?Pain management: pain level controlled ?Vital Signs Assessment: post-procedure vital signs reviewed and stable ?Respiratory status: spontaneous breathing and respiratory function stable ?Cardiovascular status: blood pressure returned to baseline and stable ?Postop Assessment: spinal receding ?Anesthetic complications: no ? ? ?No notable events documented. ? ?Last Vitals:  ?Vitals:  ? 08/25/21 1159 08/25/21 1444  ?BP: 132/83 (!) 149/72  ?Pulse: 69 73  ?Resp: 17 20  ?Temp:  36.9 ?C  ?SpO2: 97% 97%  ?  ?Last Pain:  ?Vitals:  ? 08/25/21 1444  ?TempSrc: Oral  ?PainSc:   ? ? ?  ?  ?  ?  ?  ?  ? ?Suzette Battiest E ? ? ? ? ?

## 2021-08-25 NOTE — Plan of Care (Signed)
?  Problem: Health Behavior/Discharge Planning: ?Goal: Ability to manage health-related needs will improve ?Outcome: Progressing ?  ?Problem: Clinical Measurements: ?Goal: Ability to maintain clinical measurements within normal limits will improve ?Outcome: Progressing ?Goal: Respiratory complications will improve ?Outcome: Not Applicable ?Goal: Cardiovascular complication will be avoided ?Outcome: Not Applicable ?  ?Problem: Clinical Measurements: ?Goal: Respiratory complications will improve ?Outcome: Not Applicable ?  ?Problem: Clinical Measurements: ?Goal: Cardiovascular complication will be avoided ?Outcome: Not Applicable ?  ?Problem: Activity: ?Goal: Risk for activity intolerance will decrease ?Outcome: Progressing ?  ?Problem: Nutrition: ?Goal: Adequate nutrition will be maintained ?Outcome: Progressing ?  ?

## 2021-08-25 NOTE — Op Note (Signed)
OPERATIVE REPORT-TOTAL KNEE ARTHROPLASTY ? ? ?Pre-operative diagnosis- Osteoarthritis  Right knee(s) ? ?Post-operative diagnosis- Osteoarthritis Right knee(s) ? ?Procedure-  Right  Total Knee Arthroplasty ? ?Surgeon- Dione Plover. Larhonda Dettloff, MD ? ?Assistant- Theresa Duty, PA-C  ? ?Anesthesia-   Adductor canal block and spinal ? ?EBL-25 ml ?  ?Drains None ? ?Tourniquet time-  ?Total Tourniquet Time Documented: ?Thigh (Right) - 35 minutes ?Total: Thigh (Right) - 35 minutes ?   ? ?Complications- None ? ?Condition-PACU - hemodynamically stable.  ? ?Brief Clinical Note  Rebecca Porter is a 81 y.o. year old female with end stage OA of his right knee with progressively worsening pain and dysfunction. He has constant pain, with activity and at rest and significant functional deficits with difficulties even with ADLs. He has had extensive non-op management including analgesics, injections of cortisone and viscosupplements, and home exercise program, but remains in significant pain with significant dysfunction. Radiographs show bone on bone arthritis lateral and patellofemoral. He presents now for right Total Knee Arthroplasty.    ? ?Procedure in detail--- ? ? The patient is brought into the operating room and positioned supine on the operating table. After successful administration of  Adductor canal block and spinal,   a tourniquet is placed high on the  Right thigh(s) and the lower extremity is prepped and draped in the usual sterile fashion. Time out is performed by the operating team and then the  Right lower extremity is wrapped in Esmarch, knee flexed and the tourniquet inflated to 300 mmHg.  ?     A midline incision is made with a ten blade through the subcutaneous tissue to the level of the extensor mechanism. A fresh blade is used to make a medial parapatellar arthrotomy. Soft tissue over the proximal medial tibia is subperiosteally elevated to the joint line with a knife and into the semimembranosus bursa with a  Cobb elevator. Soft tissue over the proximal lateral tibia is elevated with attention being paid to avoiding the patellar tendon on the tibial tubercle. The patella is everted, knee flexed 90 degrees and the ACL and PCL are removed. Findings are bone on bone lateral and patellofemoral with large global osteophytes   ?     The drill is used to create a starting hole in the distal femur and the canal is thoroughly irrigated with sterile saline to remove the fatty contents. The 5 degree Right  valgus alignment guide is placed into the femoral canal and the distal femoral cutting block is pinned to remove 9 mm off the distal femur. Resection is made with an oscillating saw. ?     The tibia is subluxed forward and the menisci are removed. The extramedullary alignment guide is placed referencing proximally at the medial aspect of the tibial tubercle and distally along the second metatarsal axis and tibial crest. The block is pinned to remove 64m off the more deficient lateral  side. Resection is made with an oscillating saw. Size 6is the most appropriate size for the tibia and the proximal tibia is prepared with the modular drill and keel punch for that size. ?     The femoral sizing guide is placed and size 5 is most appropriate. Rotation is marked off the epicondylar axis and confirmed by creating a rectangular flexion gap at 90 degrees. The size 5 cutting block is pinned in this rotation and the anterior, posterior and chamfer cuts are made with the oscillating saw. The intercondylar block is then placed and that cut is  made. ?     Trial size 6 tibial component, trial size 5 posterior stabilized femur and a 10  mm posterior stabilized rotating platform insert trial is placed. Full extension is achieved with excellent varus/valgus and anterior/posterior balance throughout full range of motion. The patella is everted and thickness measured to be 22  mm. Free hand resection is taken to 12 mm, a 35 template is placed, lug  holes are drilled, trial patella is placed, and it tracks normally. Osteophytes are removed off the posterior femur with the trial in place. All trials are removed and the cut bone surfaces prepared with pulsatile lavage. Cement is mixed and once ready for implantation, the size 6 tibial implant, size  5 posterior stabilized femoral component, and the size 35 patella are cemented in place and the patella is held with the clamp. The trial insert is placed and the knee held in full extension. The Exparel (20 ml mixed with 60 ml saline) is injected into the extensor mechanism, posterior capsule, medial and lateral gutters and subcutaneous tissues.  All extruded cement is removed and once the cement is hard the permanent 10 mm posterior stabilized rotating platform insert is placed into the tibial tray. ?     The wound is copiously irrigated with saline solution and the extensor mechanism closed with # 0 Stratofix suture. The tourniquet is released for a total tourniquet time of 35  minutes. Flexion against gravity is 140 degrees and the patella tracks normally. Subcutaneous tissue is closed with 2.0 vicryl and subcuticular with running 4.0 Monocryl. The incision is cleaned and dried and steri-strips and a bulky sterile dressing are applied. The limb is placed into a knee immobilizer and the patient is awakened and transported to recovery in stable condition. ?     Please note that a surgical assistant was a medical necessity for this procedure in order to perform it in a safe and expeditious manner. Surgical assistant was necessary to retract the ligaments and vital neurovascular structures to prevent injury to them and also necessary for proper positioning of the limb to allow for anatomic placement of the prosthesis. ? ? ?Dione Plover Airen Dales, MD ? ? ? ?08/25/2021, 10:24 AM ? ? ?

## 2021-08-25 NOTE — Transfer of Care (Signed)
Immediate Anesthesia Transfer of Care Note ? ?Patient: Rebecca Porter ? ?Procedure(s) Performed: TOTAL KNEE ARTHROPLASTY (Right: Knee) ? ?Patient Location: PACU ? ?Anesthesia Type:Spinal ? ?Level of Consciousness: drowsy ? ?Airway & Oxygen Therapy: Patient Spontanous Breathing and Patient connected to face mask oxygen ? ?Post-op Assessment: Report given to RN and Post -op Vital signs reviewed and stable ? ?Post vital signs: Reviewed and stable ? ?Last Vitals:  ?Vitals Value Taken Time  ?BP    ?Temp    ?Pulse    ?Resp    ?SpO2    ? ? ?Last Pain:  ?Vitals:  ? 08/25/21 0845  ?TempSrc:   ?PainSc: 0-No pain  ?   ? ?Patients Stated Pain Goal: 5 (08/25/21 0724) ? ?Complications: No notable events documented. ?

## 2021-08-25 NOTE — Care Plan (Signed)
Ortho Bundle Case Management Note ? ?Patient Details  ?Name: Rebecca Porter ?MRN: [id number] ?Date of Birth: [birth year] ? ?                ?R TKA on 08-25-21 DCP: Home with dtr DME: No needs, borrowing a RW PT: EO on 08-28-21 ? ? ?DME Arranged:  N/A ?DME Agency:    ? ?HH Arranged:    ?[facility] Agency:    ? ?Additional Comments: ?Please contact me with any questions of if this plan should need to change. ? ?Larwance Rote  [phone] ?08/25/2021, 6:08 PM ?  ?

## 2021-08-25 NOTE — Evaluation (Signed)
Physical Therapy Evaluation ?Patient Details ?Name: Rebecca Porter ?MRN: [id number] ?DOB: December 20, 1940 ?Today's Date: 08/25/2021 ? ?History of Present Illness ? Patient is 81 y.o. female s/p Rt TKA on 08/25/21 with PMH significant for OA, breast cancer, CKD, GERD< HTN, TIA. ?  ?Clinical Impression ? STAPHANY DITTON is a 81 y.o. female POD 0 s/p Rt TKA. Patient reports independence with mobility at baseline. Patient is now limited by functional impairments (see PT problem list below) and requires min assist for transfers and gait with RW. Patient was able to ambulate ~45 feet with RW and min assist. Patient instructed in exercise to facilitate circulation to manage edema and reduce risk of DVT. Patient will benefit from continued skilled PT interventions to address impairments and progress towards PLOF. Acute PT will follow to progress mobility and stair training in preparation for safe discharge home.    ?   ? ?Recommendations for follow up therapy are one component of a multi-disciplinary discharge planning process, led by the attending physician.  Recommendations may be updated based on patient status, additional functional criteria and insurance authorization. ? ?Follow Up Recommendations Follow physician's recommendations for discharge plan and follow up therapies ? ?  ?Assistance Recommended at Discharge Frequent or constant Supervision/Assistance  ?Patient can return home with the following ? A little help with walking and/or transfers;A little help with bathing/dressing/bathroom;Assistance with cooking/housework;Assist for transportation;Help with stairs or ramp for entrance ? ?  ?Equipment Recommendations None recommended by PT  ?Recommendations for Other Services ?    ?  ?Functional Status Assessment Patient has had a recent decline in their functional status and demonstrates the ability to make significant improvements in function in a reasonable and predictable amount of time.  ? ?  ?Precautions /  Restrictions Precautions ?Precautions: Fall ?Restrictions ?Weight Bearing Restrictions: No  ? ?  ? ?Mobility ? Bed Mobility ?Overal bed mobility: Needs Assistance ?Bed Mobility: Supine to Sit ?  ?  ?Supine to sit: Min assist, HOB elevated ?  ?  ?General bed mobility comments: cue to sequence and reach for bed rail. assist to pivot hips wiht bed pad and scoot to edge. ?  ? ?Transfers ?Overall transfer level: Needs assistance ?Equipment used: Rolling walker (2 wheels) ?Transfers: Sit to/from Stand ?Sit to Stand: Min assist ?  ?  ?  ?  ?  ?General transfer comment: cues for hand placement to power up and to steady with hand transition to RW. ?  ? ?Ambulation/Gait ?Ambulation/Gait assistance: Min assist ?Gait Distance (Feet): 45 Feet ?Assistive device: Rolling walker (2 wheels) ?Gait Pattern/deviations: Step-to pattern, Decreased stride length, Decreased weight shift to right, Decreased stance time - right ?Gait velocity: decr ?  ?  ?General Gait Details: cues for proximity to RW, assist to maintain. cues for step pattern and pt's step short with decreased stance time on Rt due to pain. ? ?Stairs ?  ?  ?  ?  ?  ? ?Wheelchair Mobility ?  ? ?Modified Rankin (Stroke Patients Only) ?  ? ?  ? ?Balance Overall balance assessment: Needs assistance ?Sitting-balance support: Feet supported ?Sitting balance-Leahy Scale: Good ?  ?  ?Standing balance support: Reliant on assistive device for balance, During functional activity, Bilateral upper extremity supported ?Standing balance-Leahy Scale: Poor ?  ?  ?  ?  ?  ?  ?  ?  ?  ?  ?  ?  ?   ? ? ? ?Pertinent Vitals/Pain Pain Assessment ?Pain Assessment: Faces ?Faces Pain Scale: Hurts little  more ?Pain Location: Rt knee ?Pain Descriptors / Indicators: Aching, Discomfort ?Pain Intervention(s): Limited activity within patient's tolerance, Monitored during session, Premedicated before session, Repositioned, Ice applied  ? ? ?Home Living Family/patient expects to be discharged to:: Private  residence ?Living Arrangements: Alone ?Available Help at Discharge: Family ?Type of Home: House ?Home Access: Stairs to enter ?Entrance Stairs-Rails: Can reach both;Left;Right ?Entrance Stairs-Number of Steps: 1 ?  ?Home Layout: One level ?Home Equipment: Conservation officer, nature (2 wheels);Cane - single point ?   ?  ?Prior Function Prior Level of Function : Independent/Modified Independent ?  ?  ?  ?  ?  ?  ?  ?  ?  ? ? ?Hand Dominance  ? Dominant Hand: Right ? ?  ?Extremity/Trunk Assessment  ? Upper Extremity Assessment ?Upper Extremity Assessment: Overall WFL for tasks assessed ?  ? ?Lower Extremity Assessment ?Lower Extremity Assessment: RLE deficits/detail ?RLE Deficits / Details: good quad activation, no extensor lag with SLR ?RLE Sensation: WNL ?RLE Coordination: WNL ?  ? ?Cervical / Trunk Assessment ?Cervical / Trunk Assessment: Normal  ?Communication  ? Communication: No difficulties  ?Cognition Arousal/Alertness: Awake/alert ?Behavior During Therapy: [facility] for tasks assessed/performed ?Overall Cognitive Status: Within Functional Limits for tasks assessed ?  ?  ?  ?  ?  ?  ?  ?  ?  ?  ?  ?  ?  ?  ?  ?  ?  ?  ?  ? ?  ?General Comments   ? ?  ?Exercises Total Joint Exercises ?Ankle Circles/Pumps: AROM, Both, 15 reps  ? ?Assessment/Plan  ?  ?PT Assessment Patient needs continued PT services  ?PT Problem List Decreased strength;Decreased range of motion;Decreased activity tolerance;Decreased balance;Decreased mobility;Decreased knowledge of use of DME;Decreased knowledge of precautions;Decreased safety awareness;Pain ? ?   ?  ?PT Treatment Interventions DME instruction;Gait training;Stair training;Functional mobility training;Therapeutic activities;Therapeutic exercise;Balance training;Neuromuscular re-education;Patient/family education   ? ?PT Goals (Current goals can be found in the Care Plan section)  ?Acute Rehab PT Goals ?Patient Stated Goal: get home and recover ?PT Goal Formulation: With patient ?Time For Goal  Achievement: 09/08/21 ?Potential to Achieve Goals: Good ? ?  ?Frequency 7X/week ?  ? ? ?Co-evaluation   ?  ?  ?  ?  ? ? ?  ?AM-PAC PT "6 Clicks" Mobility  ?Outcome Measure Help needed turning from your back to your side while in a flat bed without using bedrails?: A Little ?Help needed moving from lying on your back to sitting on the side of a flat bed without using bedrails?: A Little ?Help needed moving to and from a bed to a chair (including a wheelchair)?: A Little ?Help needed standing up from a chair using your arms (e.g., wheelchair or bedside chair)?: A Little ?Help needed to walk in hospital room?: A Little ?Help needed climbing 3-5 steps with a railing? : A Little ?6 Click Score: 18 ? ?  ?End of Session Equipment Utilized During Treatment: Gait belt ?Activity Tolerance: Patient tolerated treatment well ?Patient left: in chair;with call bell/phone within reach;with chair alarm set ?Nurse Communication: Mobility status ?PT Visit Diagnosis: Muscle weakness (generalized) (M62.81);Difficulty in walking, not elsewhere classified (R26.2) ?  ? ?Time: 6063-0160 ?PT Time Calculation (min) (ACUTE ONLY): 35 min ? ? ?Charges:   PT Evaluation ?$PT Eval Low Complexity: 1 Low ?PT Treatments ?$Gait Training: 8-22 mins ?  ?   ? ? ?[name] PT, DPT ?Acute Rehabilitation Services ?Office [phone] ?Pager [phone] ? ?08/25/21 4:21 PM  ? ?Apolonio Schneiders  E Quinn-Brown ?08/25/2021, 4:21 PM ? ?

## 2021-08-25 NOTE — Anesthesia Procedure Notes (Signed)
Anesthesia Regional Block: Adductor canal block  ? ?Pre-Anesthetic Checklist: , timeout performed,  Correct Patient, Correct Site, Correct Laterality,  Correct Procedure, Correct Position, site marked,  Risks and benefits discussed,  Surgical consent,  Pre-op evaluation,  At surgeon's request and post-op pain management ? ?Laterality: Right ? ?Prep: chloraprep     ?  ?Needles:  ?Injection technique: Single-shot ? ?Needle Type: Echogenic Needle   ? ? ?Needle Length: 9cm  ?Needle Gauge: 21  ? ? ? ?Additional Needles: ? ? ?Procedures:,,,, ultrasound used (permanent image in chart),,    ?Narrative:  ?Start time: 08/25/2021 8:40 AM ?End time: 08/25/2021 8:45 AM ?Injection made incrementally with aspirations every 5 mL. ? ?Performed by: Personally  ?Anesthesiologist: Suzette Battiest, MD ? ? ? ? ?

## 2021-08-25 NOTE — Anesthesia Procedure Notes (Signed)
Spinal ? ?Patient location during procedure: OR ?Start time: 08/25/2021 9:10 AM ?End time: 08/25/2021 9:14 AM ?Reason for block: surgical anesthesia ?Staffing ?Performed: anesthesiologist  ?Anesthesiologist: Suzette Battiest, MD ?Preanesthetic Checklist ?Completed: patient identified, IV checked, site marked, risks and benefits discussed, surgical consent, monitors and equipment checked, pre-op evaluation and timeout performed ?Spinal Block ?Patient position: sitting ?Prep: DuraPrep ?Patient monitoring: heart rate, cardiac monitor, continuous pulse ox and blood pressure ?Approach: midline ?Location: L4-5 ?Injection technique: single-shot ?Needle ?Needle type: Pencan  ?Needle gauge: 24 G ?Needle length: 9 cm ?Assessment ?Sensory level: T4 ?Events: CSF return ? ? ? ?

## 2021-08-25 NOTE — Interval H&P Note (Signed)
History and Physical Interval Note: ? ?08/25/2021 ?8:18 AM ? ?Rebecca Porter  has presented today for surgery, with the diagnosis of right knee osteoarthritis.  The various methods of treatment have been discussed with the patient and family. After consideration of risks, benefits and other options for treatment, the patient has consented to  Procedure(s): ?TOTAL KNEE ARTHROPLASTY (Right) as a surgical intervention.  The patient's history has been reviewed, patient examined, no change in status, stable for surgery.  I have reviewed the patient's chart and labs.  Questions were answered to the patient's satisfaction.   ? ? ?[name] ? ? ?

## 2021-08-25 NOTE — Discharge Instructions (Addendum)
? ?Gaynelle Arabian, MD ?Total Joint Specialist ?[facility] ?[address] ?[place], [place] [address] ?[phone] ? ?TOTAL KNEE REPLACEMENT POSTOPERATIVE DIRECTIONS ? ? ? ?Knee Rehabilitation, Guidelines Following Surgery  ?Results after knee surgery are often greatly improved when you follow the exercise, range of motion and muscle strengthening exercises prescribed by your doctor. Safety measures are also important to protect the knee from further injury. If any of these exercises cause you to have increased pain or swelling in your knee joint, decrease the amount until you are comfortable again and slowly increase them. If you have problems or questions, call your caregiver or physical therapist for advice.  ? ?BLOOD CLOT PREVENTION ?Take a 10 mg Xarelto once a day for three weeks following surgery. Then take an 81 mg Aspirin once a day for three weeks. Then discontinue Aspirin. ?You may resume your vitamins/supplements once you have discontinued the Xarelto. ?Do not take any NSAIDs (Advil, Aleve, Ibuprofen, Meloxicam, etc.) until you have discontinued the Xarelto.  ? ?HOME CARE INSTRUCTIONS  ?Remove items at home which could result in a fall. This includes throw rugs or furniture in walking pathways.  ?ICE to the affected knee as much as tolerated. Icing helps control swelling. If the swelling is well controlled you will be more comfortable and rehab easier. Continue to use ice on the knee for pain and swelling from surgery. You may notice swelling that will progress down to the foot and ankle. This is normal after surgery. Elevate the leg when you are not up walking on it.    ?Continue to use the breathing machine which will help keep your temperature down. It is common for your temperature to cycle up and down following surgery, especially at night when you are not up moving around and exerting yourself. The breathing machine keeps your lungs expanded and your temperature  down. ?Do not place pillow under the operative knee, focus on keeping the knee straight while resting ? ?DIET ?You may resume your previous home diet once you are discharged from the hospital. ? ?DRESSING / WOUND CARE / SHOWERING ?Keep your bulky bandage on for 2 days. On the third post-operative day you may remove the Ace bandage and gauze. There is a waterproof adhesive bandage on your skin which will stay in place until your first follow-up appointment. Once you remove this you will not need to place another bandage ?You may begin showering 3 days following surgery, but do not submerge the incision under water. ? ?ACTIVITY ?For the first 5 days, the key is rest and control of pain and swelling ?Do your home exercises twice a day starting on post-operative day 3. On the days you go to physical therapy, just do the home exercises once that day. ?You should rest, ice and elevate the leg for 50 minutes out of every hour. Get up and walk/stretch for 10 minutes per hour. After 5 days you can increase your activity slowly as tolerated. ?Walk with your walker as instructed. Use the walker until you are comfortable transitioning to a cane. Walk with the cane in the opposite hand of the operative leg. You may discontinue the cane once you are comfortable and walking steadily. ?Avoid periods of inactivity such as sitting longer than an hour when not asleep. This helps prevent blood clots.  ?You may discontinue the knee immobilizer once you are able to perform a straight leg raise while lying down. ?You may resume a sexual relationship in one month or  when given the OK by your doctor.  ?You may return to work once you are cleared by your doctor.  ?Do not drive a car for 6 weeks or until released by your surgeon.  ?Do not drive while taking narcotics. ? ?TED HOSE STOCKINGS ?Wear the elastic stockings on both legs for three weeks following surgery during the day. You may remove them at night for sleeping. ? ?WEIGHT  BEARING ?Weight bearing as tolerated with assist device (walker, cane, etc) as directed, use it as long as suggested by your surgeon or therapist, typically at least 4-6 weeks. ? ?POSTOPERATIVE CONSTIPATION PROTOCOL ?Constipation - defined medically as fewer than three stools per week and severe constipation as less than one stool per week. ? ?One of the most common issues patients have following surgery is constipation.  Even if you have a regular bowel pattern at home, your normal regimen is likely to be disrupted due to multiple reasons following surgery.  Combination of anesthesia, postoperative narcotics, change in appetite and fluid intake all can affect your bowels.  In order to avoid complications following surgery, here are some recommendations in order to help you during your recovery period. ? ?Colace (docusate) - Pick up an over-the-counter form of Colace or another stool softener and take twice a day as long as you are requiring postoperative pain medications.  Take with a full glass of water daily.  If you experience loose stools or diarrhea, hold the colace until you stool forms back up. If your symptoms do not get better within 1 week or if they get worse, check with your doctor. ?Dulcolax (bisacodyl) - Pick up over-the-counter and take as directed by the product packaging as needed to assist with the movement of your bowels.  Take with a full glass of water.  Use this product as needed if not relieved by Colace only.  ?MiraLax (polyethylene glycol) - Pick up over-the-counter to have on hand. MiraLax is a solution that will increase the amount of water in your bowels to assist with bowel movements.  Take as directed and can mix with a glass of water, juice, soda, coffee, or tea. Take if you go more than two days without a movement. Do not use MiraLax more than once per day. Call your doctor if you are still constipated or irregular after using this medication for 7 days in a row. ? ?If you continue  to have problems with postoperative constipation, please contact the office for further assistance and recommendations.  If you experience "the worst abdominal pain ever" or develop nausea or vomiting, please contact the office immediatly for further recommendations for treatment. ? ?ITCHING ?If you experience itching with your medications, try taking only a single pain pill, or even half a pain pill at a time.  You can also use Benadryl over the counter for itching or also to help with sleep.  ? ?MEDICATIONS ?See your medication summary on the ?After Visit Summary? that the nursing staff will review with you prior to discharge.  You may have some home medications which will be placed on hold until you complete the course of blood thinner medication.  It is important for you to complete the blood thinner medication as prescribed by your surgeon.  Continue your approved medications as instructed at time of discharge. ? ?PRECAUTIONS ?If you experience chest pain or shortness of breath - call 911 immediately for transfer to the hospital emergency department.  ?If you develop a fever greater that 101 F, purulent  drainage from wound, increased redness or drainage from wound, foul odor from the wound/dressing, or calf pain - CONTACT YOUR SURGEON.   ?                                                ?FOLLOW-UP APPOINTMENTS ?Make sure you keep all of your appointments after your operation with your surgeon and caregivers. You should call the office at the above phone number and make an appointment for approximately two weeks after the date of your surgery or on the date instructed by your surgeon outlined in the "After Visit Summary". ? ?RANGE OF MOTION AND STRENGTHENING EXERCISES  ?Rehabilitation of the knee is important following a knee injury or an operation. After just a few days of immobilization, the muscles of the thigh which control the knee become weakened and shrink (atrophy). Knee exercises are designed to build up  the tone and strength of the thigh muscles and to improve knee motion. Often times heat used for twenty to thirty minutes before working out will loosen up your tissues and help with improving the range of motion but do

## 2021-08-26 ENCOUNTER — Encounter (HOSPITAL_COMMUNITY): Payer: Self-pay | Admitting: Orthopedic Surgery

## 2021-08-26 DIAGNOSIS — N189 Chronic kidney disease, unspecified: Secondary | ICD-10-CM | POA: Diagnosis not present

## 2021-08-26 DIAGNOSIS — Z8673 Personal history of transient ischemic attack (TIA), and cerebral infarction without residual deficits: Secondary | ICD-10-CM | POA: Diagnosis not present

## 2021-08-26 DIAGNOSIS — M1711 Unilateral primary osteoarthritis, right knee: Secondary | ICD-10-CM | POA: Diagnosis not present

## 2021-08-26 DIAGNOSIS — Z853 Personal history of malignant neoplasm of breast: Secondary | ICD-10-CM | POA: Diagnosis not present

## 2021-08-26 DIAGNOSIS — Z79899 Other long term (current) drug therapy: Secondary | ICD-10-CM | POA: Diagnosis not present

## 2021-08-26 DIAGNOSIS — I129 Hypertensive chronic kidney disease with stage 1 through stage 4 chronic kidney disease, or unspecified chronic kidney disease: Secondary | ICD-10-CM | POA: Diagnosis not present

## 2021-08-26 LAB — CBC
HCT: 33.2 % — ABNORMAL LOW (ref 36.0–46.0)
Hemoglobin: 10.6 g/dL — ABNORMAL LOW (ref 12.0–15.0)
MCH: 30.8 pg (ref 26.0–34.0)
MCHC: 31.9 g/dL (ref 30.0–36.0)
MCV: 96.5 fL (ref 80.0–100.0)
Platelets: 227 10*3/uL (ref 150–400)
RBC: 3.44 MIL/uL — ABNORMAL LOW (ref 3.87–5.11)
RDW: 14.3 % (ref 11.5–15.5)
WBC: 14.1 10*3/uL — ABNORMAL HIGH (ref 4.0–10.5)
nRBC: 0 % (ref 0.0–0.2)

## 2021-08-26 LAB — BASIC METABOLIC PANEL
Anion gap: 5 (ref 5–15)
BUN: 24 mg/dL — ABNORMAL HIGH (ref 8–23)
CO2: 23 mmol/L (ref 22–32)
Calcium: 9 mg/dL (ref 8.9–10.3)
Chloride: 109 mmol/L (ref 98–111)
Creatinine, Ser: 1.15 mg/dL — ABNORMAL HIGH (ref 0.44–1.00)
GFR, Estimated: 48 mL/min — ABNORMAL LOW (ref >60)
Glucose, Bld: 148 mg/dL — ABNORMAL HIGH (ref 70–99)
Potassium: 5.1 mmol/L (ref 3.5–5.1)
Sodium: 137 mmol/L (ref 135–145)

## 2021-08-26 MED ORDER — CEFDINIR 300 MG PO CAPS: 300.0000 mg | ORAL_CAPSULE | Freq: Two times a day (BID) | ORAL | 0 refills | Status: AC

## 2021-08-26 MED ORDER — OXYCODONE HCL 5 MG PO TABS: 5.0000 mg | ORAL_TABLET | Freq: Four times a day (QID) | ORAL | 0 refills | Status: DC | PRN

## 2021-08-26 MED ORDER — RIVAROXABAN 10 MG PO TABS: 10.0000 mg | ORAL_TABLET | Freq: Every day | ORAL | 0 refills | Status: AC

## 2021-08-26 MED ORDER — OXYCODONE HCL 5 MG PO TABS
5.0000 mg | ORAL_TABLET | Freq: Four times a day (QID) | ORAL | 0 refills | Status: DC | PRN
Start: 2021-08-26 — End: 2021-08-26

## 2021-08-26 MED ORDER — METHOCARBAMOL 500 MG PO TABS: 500.0000 mg | ORAL_TABLET | Freq: Four times a day (QID) | ORAL | 0 refills | Status: DC | PRN

## 2021-08-26 MED ORDER — TRAMADOL HCL 50 MG PO TABS: 50.0000 mg | ORAL_TABLET | Freq: Four times a day (QID) | ORAL | 0 refills | Status: DC | PRN

## 2021-08-26 NOTE — Progress Notes (Signed)
Physical Therapy Treatment ?Patient Details ?Name: Rebecca Porter ?MRN: [id number] ?DOB: 1940/06/09 ?Today's Date: 08/26/2021 ? ? ?History of Present Illness Patient is 81 y.o. female s/p Rt TKA on 08/25/21 with PMH significant for OA, breast cancer, CKD, GERD< HTN, TIA. ? ?  ?PT Comments  ? ? Patient requires cues for safety throughout session with walker management. Daughter present and provided min guard for pt and min cues as needed with supervision from therapist. Pt ambulated short bout to bathroom and then in hall to complete stair mobility. No LOB noted with min guard from daughter. EOS reviewed remaining exercises in HEP and assisted pt with dressing. Pt is mobilizing at safe level for discharge home with daughter. Will continue to progress throughout stay. ? ?  ?Recommendations for follow up therapy are one component of a multi-disciplinary discharge planning process, led by the attending physician.  Recommendations may be updated based on patient status, additional functional criteria and insurance authorization. ? ?Follow Up Recommendations ? Follow physician's recommendations for discharge plan and follow up therapies ?  ?  ?Assistance Recommended at Discharge Frequent or constant Supervision/Assistance  ?Patient can return home with the following A little help with walking and/or transfers;A little help with bathing/dressing/bathroom;Assistance with cooking/housework;Assist for transportation;Help with stairs or ramp for entrance ?  ?Equipment Recommendations ? None recommended by PT  ?  ?Recommendations for Other Services   ? ? ?  ?Precautions / Restrictions Precautions ?Precautions: Fall ?Restrictions ?Weight Bearing Restrictions: No  ?  ? ?Mobility ? Bed Mobility ?Overal bed mobility: Needs Assistance ?Bed Mobility: Supine to Sit ?  ?  ?Supine to sit: HOB elevated, Min guard ?  ?  ?General bed mobility comments: pt OOB in recliner ?  ? ?Transfers ?Overall transfer level: Needs assistance ?Equipment  used: Rolling walker (2 wheels) ?Transfers: Sit to/from Stand ?Sit to Stand: Min guard, Supervision ?  ?  ?  ?  ?  ?General transfer comment: cues for safety with RW as pt sometimes moves walker away from self with transfers. daughter present and provided guarding and cues to patient with supervision from therapist. ?  ? ?Ambulation/Gait ?Ambulation/Gait assistance: Min guard, Supervision ?Gait Distance (Feet): 60 Feet ?Assistive device: Rolling walker (2 wheels) ?Gait Pattern/deviations: Step-to pattern, Decreased stride length, Decreased weight shift to right, Decreased stance time - right ?Gait velocity: decr ?  ?  ?General Gait Details: VC's for safety management of Rw as pt tending to pick walker up at times and cues to maintain sfe position. ? ? ?Stairs ?Stairs: Yes ?Stairs assistance: Supervision, Min guard ?Stair Management: Two rails, Forwards, Step to pattern ?Number of Stairs: 2 ?General stair comments: VC's for safe sequencing "up with good, down with bad" and for safe step pattern. no overt LOB noted. pt's daughter provided safe guarding and assist for walker with supervision from therapist. ? ? ?Wheelchair Mobility ?  ? ?Modified Rankin (Stroke Patients Only) ?  ? ? ?  ?Balance Overall balance assessment: Needs assistance ?Sitting-balance support: Feet supported ?Sitting balance-Leahy Scale: Good ?  ?  ?Standing balance support: Reliant on assistive device for balance, During functional activity, Bilateral upper extremity supported ?Standing balance-Leahy Scale: Poor ?  ?  ?  ?  ?  ?  ?  ?  ?  ?  ?  ?  ?  ? ?  ?Cognition Arousal/Alertness: Awake/alert ?Behavior During Therapy: [facility] for tasks assessed/performed ?Overall Cognitive Status: Within Functional Limits for tasks assessed ?  ?  ?  ?  ?  ?  ?  ?  ?  ?  ?  ?  ?  ?  ?  ?  ?  ?  ?  ? ?  ?  Exercises Total Joint Exercises ?Ankle Circles/Pumps: AROM, Both, 15 reps ?Quad Sets: AROM, Right, Other reps (comment) (3) ?Heel Slides: AROM, Right, Other reps  (comment) (3) ?Hip ABduction/ADduction: AROM, Right, Other reps (comment) (3) ?Long Arc Quad: AROM, Right, 5 reps ? ?  ?General Comments   ?  ?  ? ?Pertinent Vitals/Pain Pain Assessment ?Pain Assessment: Faces ?Faces Pain Scale: Hurts little more ?Pain Location: Rt knee ?Pain Descriptors / Indicators: Aching, Discomfort ?Pain Intervention(s): Limited activity within patient's tolerance, Monitored during session, Repositioned, Ice applied  ? ? ?Home Living   ?  ?  ?  ?  ?  ?  ?  ?  ?  ?   ?  ?Prior Function    ?  ?  ?   ? ?PT Goals (current goals can now be found in the care plan section) Acute Rehab PT Goals ?Patient Stated Goal: get home and recover ?PT Goal Formulation: With patient ?Time For Goal Achievement: 09/08/21 ?Potential to Achieve Goals: Good ?Progress towards PT goals: Progressing toward goals ? ?  ?Frequency ? ? ? 7X/week ? ? ? ?  ?PT Plan Current plan remains appropriate  ? ? ?Co-evaluation   ?  ?  ?  ?  ? ?  ?AM-PAC PT "6 Clicks" Mobility   ?Outcome Measure ? Help needed turning from your back to your side while in a flat bed without using bedrails?: A Little ?Help needed moving from lying on your back to sitting on the side of a flat bed without using bedrails?: A Little ?Help needed moving to and from a bed to a chair (including a wheelchair)?: A Little ?Help needed standing up from a chair using your arms (e.g., wheelchair or bedside chair)?: A Little ?Help needed to walk in hospital room?: A Little ?Help needed climbing 3-5 steps with a railing? : A Little ?6 Click Score: 18 ? ?  ?End of Session Equipment Utilized During Treatment: Gait belt ?Activity Tolerance: Patient tolerated treatment well ?Patient left: in chair;with call bell/phone within reach;with chair alarm set ?Nurse Communication: Mobility status ?PT Visit Diagnosis: Muscle weakness (generalized) (M62.81);Difficulty in walking, not elsewhere classified (R26.2) ?  ? ? ?Time: 5631-4970 ?PT Time Calculation (min) (ACUTE ONLY): 32  min ? ?Charges:  $Gait Training: 8-22 mins ?$Therapeutic Exercise: 8-22 mins          ?          ? ?[name] PT, DPT ?Acute Rehabilitation Services ?Office [phone] ?Pager [phone] ? ?08/26/21 3:03 PM  ? ? ?[name] ?08/26/2021, 3:03 PM ? ?

## 2021-08-26 NOTE — Progress Notes (Addendum)
? ?Subjective: ?1 Day Post-Op Procedure(s) (LRB): ?TOTAL KNEE ARTHROPLASTY (Right) ?Patient reports pain as mild.   ?Patient seen in rounds by Dr. Wynelle Link. ?Patient has complaints of possible urinary tract infection. Hx of recurrent UTIs, was on chronic bactrim for several years. Denies chest pain or SOB. Voiding without difficulty. ?We will continue therapy today, ambulated 24' yesterday. ? ?Objective: ?Vital signs in last 24 hours: ?Temp:  [97.8 ?F (36.6 ?C)-98.4 ?F (36.9 ?C)] 98.3 ?F (36.8 ?C) (05/16 0448) ?Pulse Rate:  [58-73] 61 (05/16 0448) ?Resp:  [12-20] 16 (05/16 0448) ?BP: (114-156)/(63-104) 124/65 (05/16 0448) ?SpO2:  [95 %-100 %] 95 % (05/16 0448) ? ?Intake/Output from previous day: ? ?Intake/Output Summary (Last 24 hours) at 08/26/2021 0759 ?Last data filed at 08/26/2021 0534 ?Gross per 24 hour  ?Intake 2456.33 ml  ?Output 1450 ml  ?Net 1006.33 ml  ?  ? ?Intake/Output this shift: ?No intake/output data recorded. ? ?Labs: ?Recent Labs  ?  08/26/21 ?0315  ?HGB 10.6*  ? ?Recent Labs  ?  08/26/21 ?0315  ?WBC 14.1*  ?RBC 3.44*  ?HCT 33.2*  ?PLT 227  ? ?Recent Labs  ?  08/26/21 ?0315  ?NA 137  ?K 5.1  ?CL 109  ?CO2 23  ?BUN 24*  ?CREATININE 1.15*  ?GLUCOSE 148*  ?CALCIUM 9.0  ? ?No results for input(s): LABPT, INR in the last 72 hours. ? ?Exam: ?General - Patient is Alert and Oriented ?Extremity - Neurologically intact ?Neurovascular intact ?Sensation intact distally ?Dorsiflexion/Plantar flexion intact ?Dressing - dressing C/D/I ?Motor Function - intact, moving foot and toes well on exam.  ? ?Past Medical History:  ?Diagnosis Date  ? Arthritis   ? Cancer [facility])   ? breast cancer 2002   ? Chronic kidney disease   ? GERD (gastroesophageal reflux disease)   ? History of kidney stones   ? Hypertension   ? Personal history of radiation therapy 2002  ? Sepsis ([place])   ? due to kidney stone blocking the flow per pt. family  10-2017  IV INVANZ  ? TIA (transient ischemic attack)   ? 09/04/20  ? ? ?Assessment/Plan: ?1 Day  Post-Op Procedure(s) (LRB): ?TOTAL KNEE ARTHROPLASTY (Right) ?Principal Problem: ?  OA (osteoarthritis) of knee ?Active Problems: ?  Osteoarthritis of right knee ? ?Estimated body mass index is 34.5 kg/m? as calculated from the following: ?  Height as of this encounter: 5' 6.5" (1.689 m). ?  Weight as of this encounter: 98.4 kg. ?Advance diet ?Up with therapy ?D/C IV fluids ? ? ?Patient's anticipated LOS is less than 2 midnights, meeting these requirements: ?- Lives within 1 hour of care ?- Has a competent adult at home to recover with post-op recover ?- NO history of ? - Chronic pain requiring opiods ? - Diabetes ? - Coronary Artery Disease ? - Heart failure ? - Heart attack ? - DVT/VTE ? - Cardiac arrhythmia ? - Respiratory Failure/COPD ? - Renal failure ? - Anemia ? - Advanced Liver disease ? ?DVT Prophylaxis - Xarelto ?Weight bearing as tolerated. ?Continue therapy. ? ?Creatinine/GFR stable. ?Complaining of urinary symptoms with history of chronic recurrent UTIs. Will send home with 5 days worth of abx treatment.  ?Patient is seen by [facility], was prescribed Cefdinir in early May with resolution. ? ?Plan is to go Home after hospital stay. ?Plan for discharge later today if progresses with therapy and meeting goals. ?Scheduled for OPPT at [facility] ?Follow-up in the office in 2 weeks ? ?The PDMP database was reviewed today  prior to any opioid medications being prescribed to this patient. ? ?Theresa Duty, PA-C ?Orthopedic Surgery ?[phone] ?08/26/2021, 7:59 AM ? ?

## 2021-08-26 NOTE — Progress Notes (Signed)
Patient discharged to home w/ family. Given all belongings, instructions. Verbalized understanding of instructions. Escorted to pov via w/c. 

## 2021-08-26 NOTE — TOC Transition Note (Signed)
Transition of Care (TOC) - CM/SW Discharge Note ? ? ?Patient Details  ?Name: Rebecca Porter ?MRN: [id number] ?Date of Birth: 03/30/1941 ? ?Transition of Care (TOC) CM/SW Contact:  ?Alany Borman, LCSW ?Phone Number: ?08/26/2021, 10:13 AM ? ? ?Clinical Narrative:    ?Met briefly with pt and confirming she has all needed DME at home.  OPPT set up at [facility]. No TOC needs. ? ? ?Final next level of care: OP Rehab ?Barriers to Discharge: No Barriers Identified ? ? ?Patient Goals and CMS Choice ?Patient states their goals for this hospitalization and ongoing recovery are:: return home ?  ?  ? ?Discharge Placement ?  ?           ?  ?  ?  ?  ? ?Discharge Plan and Services ?  ?  ?           ?DME Arranged: N/A ?  ?  ?  ?  ?  ?  ?  ?  ?  ? ?Social Determinants of Health (SDOH) Interventions ?  ? ? ?Readmission Risk Interventions ?   ? View : No data to display.  ?  ?  ?  ? ? ? ? ? ?

## 2021-08-26 NOTE — Progress Notes (Signed)
Physical Therapy Treatment ?Patient Details ?Name: Rebecca Porter ?MRN: [id number] ?DOB: 1940-04-14 ?Today's Date: 08/26/2021 ? ? ?History of Present Illness Patient is 81 y.o. female s/p Rt TKA on 08/25/21 with PMH significant for OA, breast cancer, CKD, GERD< HTN, TIA. ? ?  ?PT Comments  ? ? Patient making some progress with mobility, overall slow pace but able to ambulated ~45' with min guard/assist. Pt c/o dizziness with gait and BP noted to have dropped (see below). EOS educated pt on exercises for ROM and strengthening. Plan to progress to stair training this afternoon in preparation for safe discharge home. ? ? ? ?  08/26/2021  ? 10:32 AM 08/26/2021  ? 10:05 AM ?(After gait) 08/26/2021  ?  4:48 AM  ?Vitals  ?Systolic [phone]  ?Diastolic 62 52 65  ?Pulse 66 68 61  ? ?  ?Recommendations for follow up therapy are one component of a multi-disciplinary discharge planning process, led by the attending physician.  Recommendations may be updated based on patient status, additional functional criteria and insurance authorization. ? ?Follow Up Recommendations ? Follow physician's recommendations for discharge plan and follow up therapies ?  ?  ?Assistance Recommended at Discharge Frequent or constant Supervision/Assistance  ?Patient can return home with the following A little help with walking and/or transfers;A little help with bathing/dressing/bathroom;Assistance with cooking/housework;Assist for transportation;Help with stairs or ramp for entrance ?  ?Equipment Recommendations ? None recommended by PT  ?  ?Recommendations for Other Services   ? ? ?  ?Precautions / Restrictions Precautions ?Precautions: Fall ?Restrictions ?Weight Bearing Restrictions: No  ?  ? ?Mobility ? Bed Mobility ?Overal bed mobility: Needs Assistance ?Bed Mobility: Supine to Sit ?  ?  ?Supine to sit: HOB elevated, Min guard ?  ?  ?General bed mobility comments: cue to sequence and reach for bed rail.pt taking extra time to pivot and scoot to  edge. ?  ? ?Transfers ?Overall transfer level: Needs assistance ?Equipment used: Rolling walker (2 wheels) ?Transfers: Sit to/from Stand ?Sit to Stand: Min guard ?  ?  ?  ?  ?  ?General transfer comment: cues for hand placement to power up and hand transition to RW. guarding for safety with rise. ?  ? ?Ambulation/Gait ?Ambulation/Gait assistance: Min assist, Min guard ?Gait Distance (Feet): 45 Feet ?Assistive device: Rolling walker (2 wheels) ?Gait Pattern/deviations: Step-to pattern, Decreased stride length, Decreased weight shift to right, Decreased stance time - right ?Gait velocity: decr ?  ?  ?General Gait Details: cues for proximity to RW, assist to maintain. pt with no buckling on Rt knee, pt c/o dizziness with gait. BP had slight drop to 107/52 mmHg. ? ? ?Stairs ?  ?  ?  ?  ?  ? ? ?Wheelchair Mobility ?  ? ?Modified Rankin (Stroke Patients Only) ?  ? ? ?  ?Balance Overall balance assessment: Needs assistance ?Sitting-balance support: Feet supported ?Sitting balance-Leahy Scale: Good ?  ?  ?Standing balance support: Reliant on assistive device for balance, During functional activity, Bilateral upper extremity supported ?Standing balance-Leahy Scale: Poor ?  ?  ?  ?  ?  ?  ?  ?  ?  ?  ?  ?  ?  ? ?  ?Cognition Arousal/Alertness: Awake/alert ?Behavior During Therapy: [facility] for tasks assessed/performed ?Overall Cognitive Status: Within Functional Limits for tasks assessed ?  ?  ?  ?  ?  ?  ?  ?  ?  ?  ?  ?  ?  ?  ?  ?  ?  ?  ?  ? ?  ?  Exercises Total Joint Exercises ?Ankle Circles/Pumps: AROM, Both, 15 reps ?Quad Sets: AROM, Right, 10 reps ?Heel Slides: AROM, Right, 10 reps ?Hip ABduction/ADduction: AROM, Right, 10 reps ? ?  ?General Comments   ?  ?  ? ?Pertinent Vitals/Pain Pain Assessment ?Pain Assessment: Faces ?Faces Pain Scale: Hurts little more ?Pain Location: Rt knee ?Pain Descriptors / Indicators: Aching, Discomfort ?Pain Intervention(s): Limited activity within patient's tolerance, Repositioned, Monitored  during session  ? ? ?Home Living   ?  ?  ?  ?  ?  ?  ?  ?  ?  ?   ?  ?Prior Function    ?  ?  ?   ? ?PT Goals (current goals can now be found in the care plan section) Acute Rehab PT Goals ?Patient Stated Goal: get home and recover ?PT Goal Formulation: With patient ?Time For Goal Achievement: 09/08/21 ?Potential to Achieve Goals: Good ?Progress towards PT goals: Progressing toward goals ? ?  ?Frequency ? ? ? 7X/week ? ? ? ?  ?PT Plan Current plan remains appropriate  ? ? ?Co-evaluation   ?  ?  ?  ?  ? ?  ?AM-PAC PT "6 Clicks" Mobility   ?Outcome Measure ? Help needed turning from your back to your side while in a flat bed without using bedrails?: A Little ?Help needed moving from lying on your back to sitting on the side of a flat bed without using bedrails?: A Little ?Help needed moving to and from a bed to a chair (including a wheelchair)?: A Little ?Help needed standing up from a chair using your arms (e.g., wheelchair or bedside chair)?: A Little ?Help needed to walk in hospital room?: A Little ?Help needed climbing 3-5 steps with a railing? : A Little ?6 Click Score: 18 ? ?  ?End of Session Equipment Utilized During Treatment: Gait belt ?Activity Tolerance: Patient tolerated treatment well ?Patient left: in chair;with call bell/phone within reach;with chair alarm set ?Nurse Communication: Mobility status ?PT Visit Diagnosis: Muscle weakness (generalized) (M62.81);Difficulty in walking, not elsewhere classified (R26.2) ?  ? ? ?Time: 0950-1020 ?PT Time Calculation (min) (ACUTE ONLY): 30 min ? ?Charges:  $Gait Training: 8-22 mins ?$Therapeutic Exercise: 8-22 mins          ?          ? ?[name] PT, DPT ?Acute Rehabilitation Services ?Office [phone] ?Pager [phone] ? ?08/26/21 11:43 AM  ? ? ?[name] ?08/26/2021, 11:42 AM ? ?

## 2021-08-27 NOTE — Discharge Summary (Signed)
Patient ID: ?Rebecca Porter ?MRN: [id number] ?DOB/AGE: 80-Jun-1942 80 y.o. ? ?Admit date: 08/25/2021 ?Discharge date: 08/26/2021 ? ?Admission Diagnoses:  ?Principal Problem: ?  OA (osteoarthritis) of knee ?Active Problems: ?  Osteoarthritis of right knee ? ? ?Discharge Diagnoses:  ?Same ? ?Past Medical History:  ?Diagnosis Date  ? Arthritis   ? Cancer [facility])   ? breast cancer 2002   ? Chronic kidney disease   ? GERD (gastroesophageal reflux disease)   ? History of kidney stones   ? Hypertension   ? Personal history of radiation therapy 2002  ? Sepsis ([place])   ? due to kidney stone blocking the flow per pt. family  10-2017  IV INVANZ  ? TIA (transient ischemic attack)   ? 09/04/20  ? ? ?Surgeries: Procedure(s): ?TOTAL KNEE ARTHROPLASTY on 08/25/2021 ?  ?Consultants:  ? ?Discharged Condition: Improved ? ?Hospital Course: Rebecca Porter is an 81 y.o. female who was admitted 08/25/2021 for operative treatment ofOA (osteoarthritis) of knee. Patient has severe unremitting pain that affects sleep, daily activities, and work/hobbies. After pre-op clearance the patient was taken to the operating room on 08/25/2021 and underwent  Procedure(s): ?TOTAL KNEE ARTHROPLASTY.   ? ?Patient was given perioperative antibiotics:  ?Anti-infectives (From admission, onward)  ? ? Start     Dose/Rate Route Frequency Ordered Stop  ? 08/26/21 0000  cefdinir (OMNICEF) 300 MG capsule       ? 300 mg Oral 2 times daily 08/26/21 0811 08/31/21 2359  ? 08/25/21 2100  vancomycin (VANCOCIN) IVPB 1000 mg/200 mL premix       ? 1,000 mg ?200 mL/hr over 60 Minutes Intravenous Every 12 hours 08/25/21 1203 08/25/21 2105  ? 08/25/21 0715  vancomycin (VANCOCIN) IVPB 1000 mg/200 mL premix       ? 1,000 mg ?200 mL/hr over 60 Minutes Intravenous On call to O.R. 08/25/21 0737 08/25/21 0950  ? 08/25/21 0715  vancomycin (VANCOREADY) IVPB 1500 mg/300 mL  Status:  Discontinued       ? 1,500 mg ?150 mL/hr over 120 Minutes Intravenous On call to O.R. 08/25/21 1062 08/25/21 0711   ? ?  ?  ? ?Patient was given sequential compression devices, early ambulation, and chemoprophylaxis to prevent DVT. ? ?Patient benefited maximally from hospital stay and there were no complications.   ? ?Recent vital signs: Patient Vitals for the past 24 hrs: ? BP Temp Temp src Pulse Resp SpO2  ?08/26/21 1315 124/80 98 ?F (36.7 ?C) Oral 61 20 98 %  ?08/26/21 1032 120/62 98.2 ?F (36.8 ?C) Oral 66 20 96 %  ?08/26/21 1005 (!) 107/52 -- -- 68 -- 95 %  ?  ? ?Recent laboratory studies:  ?Recent Labs  ?  08/26/21 ?0315  ?WBC 14.1*  ?HGB 10.6*  ?HCT 33.2*  ?PLT 227  ?NA 137  ?K 5.1  ?CL 109  ?CO2 23  ?BUN 24*  ?CREATININE 1.15*  ?GLUCOSE 148*  ?CALCIUM 9.0  ? ? ? ?Discharge Medications:   ?Allergies as of 08/26/2021   ? ?   Reactions  ? Ciprofloxacin Hcl Swelling, Rash  ? Throat swelling  ? Penicillins Anaphylaxis, Hives, Other (See Comments)  ? Has patient had a PCN reaction causing immediate rash, facial/tongue/throat swelling, SOB or lightheadedness with hypotension: Yes ?Has patient had a PCN reaction causing severe rash involving mucus membranes or skin necrosis: No ?Has patient had a PCN reaction that required hospitalization: unknown ?Has patient had a PCN reaction occurring within the last 10 years: No ?If  all of the above answers are "NO", then may proceed with Cephalosporin use.  ? Macrobid [nitrofurantoin Macrocrystal] Other (See Comments)  ? Unknown reaction  ? Protonix [pantoprazole Sodium] Other (See Comments)  ? Unknown reaction  ? ?  ? ?  ?Medication List  ?  ? ?STOP taking these medications   ? ?hydrochlorothiazide 25 MG tablet ?Commonly known as: HYDRODIURIL ?  ? ?  ? ?TAKE these medications   ? ?atorvastatin 40 MG tablet ?Commonly known as: LIPITOR ?Take 40 mg by mouth daily. ?What changed: Another medication with the same name was removed. Continue taking this medication, and follow the directions you see here. ?  ?cefdinir 300 MG capsule ?Commonly known as: OMNICEF ?Take 1 capsule (300 mg total) by  mouth 2 (two) times daily for 5 days. ?  ?methocarbamol 500 MG tablet ?Commonly known as: ROBAXIN ?Take 1 tablet (500 mg total) by mouth every 6 (six) hours as needed for muscle spasms. ?  ?metoprolol succinate 25 MG 24 hr tablet ?Commonly known as: TOPROL-XL ?Take 1 tablet by mouth once daily ?  ?nystatin 100000 UNIT/ML suspension ?Commonly known as: MYCOSTATIN ?Take 5 mLs by mouth 3 (three) times daily as needed (thrush). ?  ?oxybutynin 15 MG 24 hr tablet ?Commonly known as: DITROPAN XL ?Take 15 mg by mouth daily. ?  ?oxyCODONE 5 MG immediate release tablet ?Commonly known as: Oxy IR/ROXICODONE ?Take 1-2 tablets (5-10 mg total) by mouth every 6 (six) hours as needed for severe pain. Not to exceed 6 tablets a day. ?  ?polyvinyl alcohol 1.4 % ophthalmic solution ?Commonly known as: LIQUIFILM TEARS ?Place 1 drop into both eyes as needed for dry eyes. ?  ?rivaroxaban 10 MG Tabs tablet ?Commonly known as: XARELTO ?Take 1 tablet (10 mg total) by mouth daily with breakfast for 20 days. Then take one 81 mg aspirin once a day for three weeks. Then discontinue aspirin. ?  ?traMADol 50 MG tablet ?Commonly known as: ULTRAM ?Take 1-2 tablets (50-100 mg total) by mouth every 6 (six) hours as needed for moderate pain. ?  ?trimethoprim 100 MG tablet ?Commonly known as: TRIMPEX ?Take 100 mg by mouth daily. ?  ?Vitamin D 50 MCG (2000 UT) Caps ?Take 2,000 Units by mouth daily. ?  ? ?  ? ?  ?  ? ? ?  ?Discharge Care Instructions  ?(From admission, onward)  ?  ? ? ?  ? ?  Start     Ordered  ? 08/26/21 0000  Weight bearing as tolerated       ? 08/26/21 0811  ? 08/26/21 0000  Change dressing       ?Comments: You may remove the bulky bandage (ACE wrap and gauze) two days after surgery. You will have an adhesive waterproof bandage underneath. Leave this in place until your first follow-up appointment.  ? 08/26/21 0811  ? ?  ?  ? ?  ? ? ?Diagnostic Studies: No results found. ? ?Disposition: Discharge disposition: 01-Home or Self  Care ? ? ? ? ? ? ?Discharge Instructions   ? ? Call MD / Call 911   Complete by: As directed ?  ? If you experience chest pain or shortness of breath, CALL 911 and be transported to the hospital emergency room.  If you develope a fever above 101 F, pus (white drainage) or increased drainage or redness at the wound, or calf pain, call your surgeon's office.  ? Change dressing   Complete by: As directed ?  ? You  may remove the bulky bandage (ACE wrap and gauze) two days after surgery. You will have an adhesive waterproof bandage underneath. Leave this in place until your first follow-up appointment.  ? Constipation Prevention   Complete by: As directed ?  ? Drink plenty of fluids.  Prune juice may be helpful.  You may use a stool softener, such as Colace (over the counter) 100 mg twice a day.  Use MiraLax (over the counter) for constipation as needed.  ? Diet - low sodium heart healthy   Complete by: As directed ?  ? Do not put a pillow under the knee. Place it under the heel.   Complete by: As directed ?  ? Driving restrictions   Complete by: As directed ?  ? No driving for two weeks  ? Post-operative opioid taper instructions:   Complete by: As directed ?  ? POST-OPERATIVE OPIOID TAPER INSTRUCTIONS: ?It is important to wean off of your opioid medication as soon as possible. If you do not need pain medication after your surgery it is ok to stop day one. ?Opioids include: ?Codeine, Hydrocodone(Norco, Vicodin), Oxycodone(Percocet, oxycontin) and hydromorphone amongst others.  ?Long term and even short term use of opiods can cause: ?Increased pain response ?Dependence ?Constipation ?Depression ?Respiratory depression ?And more.  ?Withdrawal symptoms can include ?Flu like symptoms ?Nausea, vomiting ?And more ?Techniques to manage these symptoms ?Hydrate well ?Eat regular healthy meals ?Stay active ?Use relaxation techniques(deep breathing, meditating, yoga) ?Do Not substitute Alcohol to help with tapering ?If you have  been on opioids for less than two weeks and do not have pain than it is ok to stop all together.  ?Plan to wean off of opioids ?This plan should start within one week post op of your joint replacement. ?Maintain the

## 2021-08-28 DIAGNOSIS — M25561 Pain in right knee: Secondary | ICD-10-CM | POA: Diagnosis not present

## 2021-09-01 DIAGNOSIS — M25561 Pain in right knee: Secondary | ICD-10-CM | POA: Diagnosis not present

## 2021-09-04 DIAGNOSIS — M25561 Pain in right knee: Secondary | ICD-10-CM | POA: Diagnosis not present

## 2021-09-09 DIAGNOSIS — M25561 Pain in right knee: Secondary | ICD-10-CM | POA: Diagnosis not present

## 2021-09-18 DIAGNOSIS — M25561 Pain in right knee: Secondary | ICD-10-CM | POA: Diagnosis not present

## 2021-09-25 DIAGNOSIS — M25561 Pain in right knee: Secondary | ICD-10-CM | POA: Diagnosis not present

## 2021-09-30 DIAGNOSIS — Z5189 Encounter for other specified aftercare: Secondary | ICD-10-CM | POA: Diagnosis not present

## 2021-09-30 DIAGNOSIS — M1711 Unilateral primary osteoarthritis, right knee: Secondary | ICD-10-CM | POA: Diagnosis not present

## 2021-09-30 DIAGNOSIS — Z96651 Presence of right artificial knee joint: Secondary | ICD-10-CM | POA: Diagnosis not present

## 2021-09-30 DIAGNOSIS — M25561 Pain in right knee: Secondary | ICD-10-CM | POA: Diagnosis not present

## 2021-10-07 DIAGNOSIS — M25561 Pain in right knee: Secondary | ICD-10-CM | POA: Diagnosis not present

## 2021-10-09 DIAGNOSIS — M25561 Pain in right knee: Secondary | ICD-10-CM | POA: Diagnosis not present

## 2021-10-15 DIAGNOSIS — R350 Frequency of micturition: Secondary | ICD-10-CM | POA: Diagnosis not present

## 2021-10-15 DIAGNOSIS — N3000 Acute cystitis without hematuria: Secondary | ICD-10-CM | POA: Diagnosis not present

## 2021-10-15 DIAGNOSIS — N3941 Urge incontinence: Secondary | ICD-10-CM | POA: Diagnosis not present

## 2021-10-16 DIAGNOSIS — M25561 Pain in right knee: Secondary | ICD-10-CM | POA: Diagnosis not present

## 2021-10-21 DIAGNOSIS — M25561 Pain in right knee: Secondary | ICD-10-CM | POA: Diagnosis not present

## 2021-10-23 DIAGNOSIS — M25561 Pain in right knee: Secondary | ICD-10-CM | POA: Diagnosis not present

## 2021-11-10 DIAGNOSIS — E669 Obesity, unspecified: Secondary | ICD-10-CM | POA: Diagnosis not present

## 2021-12-11 DIAGNOSIS — E669 Obesity, unspecified: Secondary | ICD-10-CM | POA: Diagnosis not present

## 2022-01-01 DIAGNOSIS — D649 Anemia, unspecified: Secondary | ICD-10-CM | POA: Diagnosis not present

## 2022-01-01 DIAGNOSIS — R030 Elevated blood-pressure reading, without diagnosis of hypertension: Secondary | ICD-10-CM | POA: Diagnosis not present

## 2022-01-01 DIAGNOSIS — E559 Vitamin D deficiency, unspecified: Secondary | ICD-10-CM | POA: Diagnosis not present

## 2022-01-01 DIAGNOSIS — E785 Hyperlipidemia, unspecified: Secondary | ICD-10-CM | POA: Diagnosis not present

## 2022-01-10 DIAGNOSIS — E669 Obesity, unspecified: Secondary | ICD-10-CM | POA: Diagnosis not present

## 2022-01-13 DIAGNOSIS — N3941 Urge incontinence: Secondary | ICD-10-CM | POA: Diagnosis not present

## 2022-01-13 DIAGNOSIS — N3000 Acute cystitis without hematuria: Secondary | ICD-10-CM | POA: Diagnosis not present

## 2022-02-10 DIAGNOSIS — E669 Obesity, unspecified: Secondary | ICD-10-CM | POA: Diagnosis not present

## 2022-02-13 DIAGNOSIS — H6122 Impacted cerumen, left ear: Secondary | ICD-10-CM | POA: Diagnosis not present

## 2022-02-13 DIAGNOSIS — H9209 Otalgia, unspecified ear: Secondary | ICD-10-CM | POA: Diagnosis not present

## 2022-02-13 DIAGNOSIS — N390 Urinary tract infection, site not specified: Secondary | ICD-10-CM | POA: Diagnosis not present

## 2022-02-13 DIAGNOSIS — R42 Dizziness and giddiness: Secondary | ICD-10-CM | POA: Diagnosis not present

## 2022-03-12 DIAGNOSIS — E669 Obesity, unspecified: Secondary | ICD-10-CM | POA: Diagnosis not present

## 2022-05-11 DIAGNOSIS — E669 Obesity, unspecified: Secondary | ICD-10-CM | POA: Diagnosis not present

## 2022-05-11 DIAGNOSIS — Z6833 Body mass index (BMI) 33.0-33.9, adult: Secondary | ICD-10-CM | POA: Diagnosis not present

## 2022-05-22 ENCOUNTER — Other Ambulatory Visit: Payer: Self-pay | Admitting: Cardiovascular Disease

## 2022-05-25 DIAGNOSIS — Z6835 Body mass index (BMI) 35.0-35.9, adult: Secondary | ICD-10-CM | POA: Diagnosis not present

## 2022-05-25 DIAGNOSIS — Z124 Encounter for screening for malignant neoplasm of cervix: Secondary | ICD-10-CM | POA: Diagnosis not present

## 2022-05-26 ENCOUNTER — Other Ambulatory Visit: Payer: Self-pay | Admitting: Obstetrics and Gynecology

## 2022-05-26 DIAGNOSIS — Z1231 Encounter for screening mammogram for malignant neoplasm of breast: Secondary | ICD-10-CM

## 2022-05-28 DIAGNOSIS — N3000 Acute cystitis without hematuria: Secondary | ICD-10-CM | POA: Diagnosis not present

## 2022-06-24 DIAGNOSIS — D2272 Melanocytic nevi of left lower limb, including hip: Secondary | ICD-10-CM | POA: Diagnosis not present

## 2022-06-24 DIAGNOSIS — D2261 Melanocytic nevi of right upper limb, including shoulder: Secondary | ICD-10-CM | POA: Diagnosis not present

## 2022-06-24 DIAGNOSIS — L720 Epidermal cyst: Secondary | ICD-10-CM | POA: Diagnosis not present

## 2022-06-24 DIAGNOSIS — L821 Other seborrheic keratosis: Secondary | ICD-10-CM | POA: Diagnosis not present

## 2022-06-24 DIAGNOSIS — D2271 Melanocytic nevi of right lower limb, including hip: Secondary | ICD-10-CM | POA: Diagnosis not present

## 2022-06-24 DIAGNOSIS — D485 Neoplasm of uncertain behavior of skin: Secondary | ICD-10-CM | POA: Diagnosis not present

## 2022-06-24 DIAGNOSIS — D2262 Melanocytic nevi of left upper limb, including shoulder: Secondary | ICD-10-CM | POA: Diagnosis not present

## 2022-06-24 DIAGNOSIS — D225 Melanocytic nevi of trunk: Secondary | ICD-10-CM | POA: Diagnosis not present

## 2022-06-24 DIAGNOSIS — L738 Other specified follicular disorders: Secondary | ICD-10-CM | POA: Diagnosis not present

## 2022-06-24 DIAGNOSIS — N3000 Acute cystitis without hematuria: Secondary | ICD-10-CM | POA: Diagnosis not present

## 2022-06-24 DIAGNOSIS — D2221 Melanocytic nevi of right ear and external auricular canal: Secondary | ICD-10-CM | POA: Diagnosis not present

## 2022-07-02 DIAGNOSIS — R030 Elevated blood-pressure reading, without diagnosis of hypertension: Secondary | ICD-10-CM | POA: Diagnosis not present

## 2022-07-02 DIAGNOSIS — E785 Hyperlipidemia, unspecified: Secondary | ICD-10-CM | POA: Diagnosis not present

## 2022-07-02 DIAGNOSIS — N390 Urinary tract infection, site not specified: Secondary | ICD-10-CM | POA: Diagnosis not present

## 2022-07-02 DIAGNOSIS — F322 Major depressive disorder, single episode, severe without psychotic features: Secondary | ICD-10-CM | POA: Diagnosis not present

## 2022-07-02 DIAGNOSIS — E559 Vitamin D deficiency, unspecified: Secondary | ICD-10-CM | POA: Diagnosis not present

## 2022-07-02 DIAGNOSIS — Z0000 Encounter for general adult medical examination without abnormal findings: Secondary | ICD-10-CM | POA: Diagnosis not present

## 2022-07-10 ENCOUNTER — Ambulatory Visit
Admission: RE | Admit: 2022-07-10 | Discharge: 2022-07-10 | Disposition: A | Discharge status: Home / Self Care | Payer: Medicare Other | Source: Ambulatory Visit | Attending: Obstetrics and Gynecology | Admitting: Obstetrics and Gynecology

## 2022-07-10 DIAGNOSIS — Z1231 Encounter for screening mammogram for malignant neoplasm of breast: Secondary | ICD-10-CM

## 2022-07-10 HISTORY — DX: Malignant neoplasm of unspecified site of unspecified female breast: C50.919

## 2022-07-20 ENCOUNTER — Ambulatory Visit: Payer: Medicare Other | Attending: Cardiovascular Disease | Admitting: Cardiovascular Disease

## 2022-07-20 ENCOUNTER — Encounter: Payer: Self-pay | Admitting: Cardiovascular Disease

## 2022-07-20 VITALS — BP 130/80 | HR 66 | Ht 66.5 in | Wt 222.0 lb

## 2022-07-20 DIAGNOSIS — R600 Localized edema: Secondary | ICD-10-CM | POA: Insufficient documentation

## 2022-07-20 DIAGNOSIS — I340 Nonrheumatic mitral (valve) insufficiency: Secondary | ICD-10-CM | POA: Diagnosis not present

## 2022-07-20 DIAGNOSIS — I10 Essential (primary) hypertension: Secondary | ICD-10-CM | POA: Diagnosis not present

## 2022-07-20 DIAGNOSIS — G459 Transient cerebral ischemic attack, unspecified: Secondary | ICD-10-CM | POA: Insufficient documentation

## 2022-07-20 DIAGNOSIS — I739 Peripheral vascular disease, unspecified: Secondary | ICD-10-CM | POA: Insufficient documentation

## 2022-07-20 DIAGNOSIS — I071 Rheumatic tricuspid insufficiency: Secondary | ICD-10-CM | POA: Insufficient documentation

## 2022-07-20 NOTE — Patient Instructions (Signed)
Medication Instructions:  Your physician recommends that you continue on your current medications as directed. Please refer to the Current Medication list given to you today.  *If you need a refill on your cardiac medications before your next appointment, please call your pharmacy*  Testing/Procedures: Your physician has requested that you have an ankle brachial index (ABI). During this test an ultrasound and blood pressure cuff are used to evaluate the arteries that supply the arms and legs with blood. Allow thirty minutes for this exam. There are no restrictions or special instructions.  Follow-Up: At [facility], you and your health needs are our priority.  As part of our continuing mission to provide you with exceptional heart care, we have created designated Provider Care Teams.  These Care Teams include your primary Cardiologist (physician) and Advanced Practice Providers (APPs -  Physician Assistants and Nurse Practitioners) who all work together to provide you with the care you need, when you need it.  We recommend signing up for the patient portal called "MyChart".  Sign up information is provided on this After Visit Summary.  MyChart is used to connect with patients for Virtual Visits (Telemedicine).  Patients are able to view lab/test results, encounter notes, upcoming appointments, etc.  Non-urgent messages can be sent to your provider as well.   To learn more about what you can do with MyChart, go to [email or web address].    Your next appointment:   12 month(s)  Provider:   Nicki Guadalajara, MD

## 2022-07-20 NOTE — Progress Notes (Signed)
Cardiology Office Note    Date:  07/25/2022   ID:  Rebecca Porter, DOB 02-19-1941, MRN [id number]  PCP:  Rebecca HasMorrow, Aaron, MD  Cardiologist:  Rebecca Guadalajarahomas Arhianna Ebey, MD   16 month F/U cardiololgy evaluation   History of Present Illness:  Rebecca Porter is a 82 y.o. female who presents to the office today for a 16 month follow-up cardiology evaluation.  Rebecca Porter has a history of a left kidney stone leading to hospitalization in July 2019 with urosepsis secondary to E. coli sensitive to ertapenem.  She subsequently developed low back pain and was felt to have L4-L5 epidural abscess and discitis leading to her hospitalization at [facility] in August 2019.  She was evaluated by neurology and had a PICC line inserted for 6 weeks of antibiotic therapy.  She has noticed mild shortness of breath and progressive exertional dyspnea but denies any definitive episodes of chest pain or palpitations.  She is in need to undergo urologic surgery and audiology clearance was recommended prior to surgery due to new left bundle branch block.  Of note, the patient states that she was evaluated July and ECG at that time was done.  I was able to obtain the ECG report but not see the tracing and reportedly this was interpreted as sinus tachycardia with anteroseptal infarct, age undetermined, ST abnormality with possible lateral subendocardial injury and was felt to be abnormal without prior ECG available for comparison.  When I saw her for initial cardiology evaluation, she denied any episodes of chest pressure prior MI but admitted to mild progressive shortness of breath with exertion and had noticed some leg swelling.  Preoperative echo Doppler study and nuclear stress testing.  Her nuclear stress test revealed an EF of 46%.  There were no ECG changes.  There was a medium defect of moderate severity in the anteroseptal septal wall and it was felt most likely this was due to left bundle branch block attenuation artifact but  anteroseptal ischemia could not be completely excluded.  An echo Doppler study revealed an ejection fraction at 55% without wall motion abnormalities.  She had mild PA pressure elevation at 41 mm.  She was given clearance to undergo her urologic surgery which she was able to tolerate well without cardiovascular compromise.  She states she has lost 34 pounds over 4 months.  She did not tolerate lisinopril due to cough.  He denies recent chest pain or shortness of breath.  I saw her in November 2019.  At that time her blood pressure was elevated and her resting pulse was 90.  I added Toprol-XL 25 mg to her medical regimen.    I saw her in September 2020.  Over the prior year, her blood pressure at home typically was running in the 120s to 130s.  She had noticed some intermittent ankle swelling.  She had undergone laboratory by her primary physician Rebecca Porter in February 2020.  Her total cholesterol was 222 and LDL cholesterol 130.  She was not started on any lipid-lowering therapy.  She has been on metoprolol 25 mg daily.  During that evaluation, her blood pressure remained elevated and with a history of some intermittent mild ankle swelling I gave her a prescription for HCTZ to take on an as needed basis for swelling and if her blood pressure remained in the 130 or above range.  I saw her on March 04, 2020 and over the prior year she had continued to do well..  However she  admits to a 25 pound weight gain since the Covid pandemic began.  She did receive the Covid vaccine.  She continues to be followed by Rebecca Porter who saw her in September 2021. Her blood pressure at home typically is less than 130/80.  She denied any significant swelling.    I last saw her on March 27, 2021.  Apparently she was felt to have had a TIA in April 2022 when she suddenly had word finding difficulty and could not get the words out.  There was no associated slurred speech or facial droop, numbness or weakness or visual  disturbance.  Symptoms resolved after approximately 10 to 15 minutes.  She has been evaluated by Rebecca Porter of neurology.  Subsequently, she underwent carotid Doppler studies on September 7 which showed mild 1 to 39% plaque in both carotids and normal vertebral flow.  A 2D echo Doppler study on December 31, 2020 showed EF at 50 to 55% with grade 1 diastolic dysfunction.  There was mild MR and moderate TR.  Presently, she denies any chest pain or recurrent neurologic symptoms.  She does experience intermittent lower extremity edema bilaterally but she states she recently ate at the [employer] and her food was very salty.  She has been on atorvastatin 20 mg daily and LDL cholesterol in August 2022 was 77.  She is on metoprolol succinate 25 mg daily.  During that evaluation her ECG was stable showing sinus rhythm at 65 bpm.  Her blood pressure was elevated.  With lower extremity edema I recommended she initiate HCTZ 12.5 mg and recommended support stockings with 20 to 30 mm pressure support.  Since I last saw her, she tells me she had recurrent urinary tract infections and had undergone bladder surgeries in February and March 2023.  In May 2023 she underwent total right knee replacement with Dr. Ethlyn Porter.  She developed COVID in December 2023.  She denies any chest pain.  She has had issues with lower extremity edema.  She sees Rebecca Porter for primary care.  She presents for evaluation.   Past Medical History:  Diagnosis Date   Arthritis    Breast cancer    Cancer    breast cancer 2002    Chronic kidney disease    GERD (gastroesophageal reflux disease)    History of kidney stones    Hypertension    Personal history of radiation therapy 2002   Sepsis    due to kidney stone blocking the flow per pt. family  10-2017  IV INVANZ   TIA (transient ischemic attack)    09/04/20    Past Surgical History:  Procedure Laterality Date   BREAST BIOPSY Left    malignant stereo    BREAST LUMPECTOMY Left 2002    CHOLECYSTECTOMY     1997   CYSTOSCOPY     Left stone removal 12-31-17   CYSTOSCOPY/URETEROSCOPY/HOLMIUM LASER/STENT PLACEMENT Left 01/10/2018   Procedure: CYSTOSCOPY, URETEROSCOPY,RETROGRADE PYLEGRAM,BASKET STONE EXTRACTION, AND  STENT REMOVAL;  Surgeon: Ihor Gully, MD;  Location: [facility];  Service: Urology;  Laterality: Left;   IR LUMBAR DISC ASPIRATION W/IMG GUIDE  11/29/2017   abscess on spine  between L5 and S1   On IV Invanz    RENAL ARTERY STENT     left November 07 2017   TOTAL KNEE ARTHROPLASTY Right 08/25/2021   Procedure: TOTAL KNEE ARTHROPLASTY;  Surgeon: Ollen Gross, MD;  Location: [facility];  Service: Orthopedics;  Laterality: Right;    Current Medications: Outpatient Medications  Prior to Visit  Medication Sig Dispense Refill   atorvastatin (LIPITOR) 40 MG tablet Take 40 mg by mouth daily.     Cholecalciferol (VITAMIN D) 50 MCG (2000 UT) CAPS Take 2,000 Units by mouth daily.     methocarbamol (ROBAXIN) 500 MG tablet Take 1 tablet (500 mg total) by mouth every 6 (six) hours as needed for muscle spasms. 40 tablet 0   metoprolol succinate (TOPROL-XL) 25 MG 24 hr tablet Take 1 tablet by mouth once daily 90 tablet 0   nystatin (MYCOSTATIN) 100000 UNIT/ML suspension Take 5 mLs by mouth 3 (three) times daily as needed (thrush).     oxybutynin (DITROPAN XL) 15 MG 24 hr tablet Take 15 mg by mouth daily.     oxyCODONE (OXY IR/ROXICODONE) 5 MG immediate release tablet Take 1-2 tablets (5-10 mg total) by mouth every 6 (six) hours as needed for severe pain. Not to exceed 6 tablets a day. 42 tablet 0   polyvinyl alcohol (LIQUIFILM TEARS) 1.4 % ophthalmic solution Place 1 drop into both eyes as needed for dry eyes.     traMADol (ULTRAM) 50 MG tablet Take 1-2 tablets (50-100 mg total) by mouth every 6 (six) hours as needed for moderate pain. 40 tablet 0   trimethoprim (TRIMPEX) 100 MG tablet Take 100 mg by mouth daily.     No facility-administered medications prior to visit.     Allergies:    Ciprofloxacin hcl, Penicillins, Macrobid [nitrofurantoin macrocrystal], and Protonix [pantoprazole sodium]   Social History   Socioeconomic History   Marital status: Widowed    Spouse name: Not on file   Number of children: Not on file   Years of education: Not on file   Highest education level: Not on file  Occupational History   Not on file  Tobacco Use   Smoking status: Never   Smokeless tobacco: Never  Vaping Use   Vaping Use: Never used  Substance and Sexual Activity   Alcohol use: Never   Drug use: Never   Sexual activity: Not Currently  Other Topics Concern   Not on file  Social History Narrative   Right handed   No caffeine   One story home   Social Determinants of Health   Financial Resource Strain: Not on file  Food Insecurity: Not on file  Transportation Needs: Not on file  Physical Activity: Not on file  Stress: Not on file  Social Connections: Not on file    Social history is notable that she is widowed for >10 years.  She has 2 children, 4 grandchildren, and 2 great-grandchildren.  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  There is no tobacco history.  She does not exercise.  There is no alcohol use.  Family History:  The patient's family history is not on file.   Family history is notable for mother dying at age 81 with colon cancer.  Her father died at age 372 but has suffered a massive heart attack in [id number].    ROS General: Negative; No fevers, chills, or night sweats;  HEENT: Negative; No changes in vision or hearing, sinus congestion, difficulty swallowing Pulmonary: Negative; No cough, wheezing, shortness of breath, hemoptysis Cardiovascular: Positive for exertional dyspnea.  No chest pain or palpitations; no awareness of prior MI; positive for leg swelling GI: Negative; No nausea, vomiting, diarrhea, or abdominal pain GU: Positive for recent urosepsis and left kidney stone with subsequent stenting Musculoskeletal: Right knee discomfort  which has limited her activity. Hematologic/Oncology:  Negative; no easy bruising, bleeding Endocrine: Negative; no heat/cold intolerance; no diabetes Neuro: Sciatica symptoms from the spine abscess; probable TIA April 2022 Skin: Negative; No rashes or skin lesions Psychiatric: Negative; No behavioral problems, depression Sleep: Negative; No snoring, daytime sleepiness, hypersomnolence, bruxism, restless legs, hypnogognic hallucinations, no cataplexy Other comprehensive 14 point system review is negative.   PHYSICAL EXAM:   VS:  BP 130/80   Pulse 66   Ht 5' 6.5" (1.689 m)   Wt 222 lb (100.7 kg)   SpO2 95%   BMI 35.29 kg/m     Repeat blood pressure by me 136/78  Wt Readings from Last 3 Encounters:  07/20/22 222 lb (100.7 kg)  08/25/21 217 lb (98.4 kg)  08/12/21 217 lb (98.4 kg)    General: Alert, oriented, no distress.  Skin: normal turgor, no rashes, warm and dry HEENT: Normocephalic, atraumatic. Pupils equal round and reactive to light; sclera anicteric; extraocular muscles intact;  Nose without nasal septal hypertrophy Mouth/Parynx benign; Mallinpatti scale 3 Neck: No JVD, no carotid bruits; normal carotid upstroke Lungs: clear to ausculatation and percussion; no wheezing or rales Chest wall: without tenderness to palpitation Heart: PMI not displaced, RRR, s1 s2 normal, 1/6 systolic murmur, no diastolic murmur, no rubs, gallops, thrills, or heaves Abdomen: soft, nontender; no hepatosplenomehaly, BS+; abdominal aorta nontender and not dilated by palpation. Back: no CVA tenderness Pulses 2+ Musculoskeletal: full range of motion, normal strength, no joint deformities Extremities: Trace bilateral lower extremity edema, wearing support stockings; no clubbing cyanosis, Homan's sign negative  Neurologic: grossly nonfocal; Cranial nerves grossly wnl Psychologic: Normal mood and affect   Studies/Labs Reviewed:   July 20, 2022 ECG (independently read by me): NSR at 66, LBBB, no  ectopy  March 27, 2021 ECG (independently read by me): Sinus rhythm at 65; PVC  March 04, 2021 ECG (independently read by me): NSR at 64; LBBB; QTc 466 msec  September 2020 ECG (independently read by me): Normal sinus rhythm at 60 bpm, left bundle branch block, QTc interval 456 Rebecca  February 11, 2018 ECG (independently read by me): Normal sinus rhythm at 90 bpm.  Left bundle branch block with repolarization changes.  QTc interval 481 Rebecca.  December 28, 2017 ECG (independently read by me): Normal sinus rhythm at 88 bpm.  Left bundle branch block with repolarization changes.  Poor R wave progression V1 through V6.  December 22, 2017 ECG (independently reviewed by me): Normal sinus rhythm.  Left bundle branch block with repolarization changes.   Recent Labs:    Latest Ref Rng & Units 08/26/2021    3:15 AM 08/12/2021    2:59 PM 12/22/2017    2:52 PM  BMP  Glucose 70 - 99 mg/dL 161  096  89   BUN 8 - 23 mg/dL 24  20  22    Creatinine 0.44 - 1.00 mg/dL 0.45  4.09  8.11   Sodium 135 - 145 mmol/L 137  140  143   Potassium 3.5 - 5.1 mmol/L 5.1  3.9  3.7   Chloride 98 - 111 mmol/L 109  108  108   CO2 22 - 32 mmol/L 23  27  26    Calcium 8.9 - 10.3 mg/dL 9.0  9.6  9.6         Latest Ref Rng & Units 08/12/2021    2:59 PM 12/30/2017   10:22 AM 11/26/2017    7:42 PM  Hepatic Function  Total Protein 6.5 - 8.1 g/dL 7.2  6.4  6.0  Albumin 3.5 - 5.0 g/dL 3.6  3.5  2.4   AST 15 - 41 U/L 17  20  16    ALT 0 - 44 U/L 12  15  21    Alk Phosphatase 38 - 126 U/L 87  98  145   Total Bilirubin 0.3 - 1.2 mg/dL 0.9  0.4  0.6   Bilirubin, Direct 0.00 - 0.40 mg/dL  8.11         Latest Ref Rng & Units 08/26/2021    3:15 AM 08/12/2021    2:59 PM 12/22/2017    2:52 PM  CBC  WBC 4.0 - 10.5 K/uL 14.1  6.4  6.7   Hemoglobin 12.0 - 15.0 g/dL 91.4  78.2  95.6   Hematocrit 36.0 - 46.0 % 33.2  37.7  34.3   Platelets 150 - 400 K/uL 227  223  222    Lab Results  Component Value Date   MCV 96.5 08/26/2021    MCV 94.3 08/12/2021   MCV 93.5 12/22/2017   Lab Results  Component Value Date   TSH 2.470 12/30/2017   No results found for: "HGBA1C"   BNP No results found for: "BNP"  ProBNP No results found for: "PROBNP"   Lipid Panel     Component Value Date/Time   CHOL 159 12/06/2020 1540   CHOL 186 12/30/2017 1022   TRIG 76.0 12/06/2020 1540   HDL 65.90 12/06/2020 1540   HDL 65 12/30/2017 1022   CHOLHDL 2 12/06/2020 1540   VLDL 15.2 12/06/2020 1540   LDLCALC 77 12/06/2020 1540   LDLCALC 107 (H) 12/30/2017 1022     RADIOLOGY: MM 3D SCREEN BREAST BILATERAL  Result Date: 07/13/2022 CLINICAL DATA:  Screening. EXAM: DIGITAL SCREENING BILATERAL MAMMOGRAM WITH TOMOSYNTHESIS AND CAD TECHNIQUE: Bilateral screening digital craniocaudal and mediolateral oblique mammograms were obtained. Bilateral screening digital breast tomosynthesis was performed. The images were evaluated with computer-aided detection. COMPARISON:  Previous exam(s). ACR Breast Density Category b: There are scattered areas of fibroglandular density. FINDINGS: There are no findings suspicious for malignancy. IMPRESSION: No mammographic evidence of malignancy. A result letter of this screening mammogram will be mailed directly to the patient. RECOMMENDATION: Screening mammogram in one year. (Code:SM-B-01Y) BI-RADS CATEGORY  1: Negative. Electronically Signed   By: Bary Richard M.D.   On: 07/13/2022 13:44     Additional studies/ records that were reviewed today include:  Reviewed the patient's hospital records and obtained interpretation analysis of a prior ECG.  Recent laboratory was reviewed.  ASSESSMENT:    1. Essential hypertension   2. Evaluate for claudication   3. Bilateral lower extremity edema   4. TIA (transient ischemic attack): April 2022   5. Moderate tricuspid regurgitation   6. Mild nonrheumatic mitral valve regurgitation     PLAN:  Rebecca. Avin Upperman is a 82 year-old female who developed urosepsis from a  left kidney stone which was culture positive for extended spectrum beta-lactamase (ESBL) E. coli requiring PICC line insertion and 6 weeks of antibiotic therapy.  She has multiple allergies and required ertapenem antibiotic therapy.  She denied any prior cardiac history.  Her ECG reported in July 2019 from [place] raise the possibility of age-indeterminate anteroseptal MI with possible lateral subendocardial injury.  Her ECG when I initially evaluated her revealed sinus rhythm with poor R wave progression and left bundle branch block with repolarization changes.  She had experienced exertional dyspnea and increased shortness of breath as well as leg edema and subsequently underwent a nuclear  perfusion study and echo Doppler assessment.  Her EF was normal on her echo study without significant valvular pathology and there was evidence for mild grade 1 diastolic dysfunction.  There was mild TR and mildly increased PA pressure.  Her nuclear perfusion study was low risk which showed mild anteroseptal defect most likely due to her underlying left bundle branch block.  In April 2022 she may have suffered a small TIA when she had sudden transient difficulty and word finding and cannot get her words out. Her most recent echo from September 2022  showed EF of 50 to 55% with grade 1 diastolic dysfunction, mild MR and moderate TR.  Carotid Doppler studies did not reveal significant stenoses with mild plaque in the 1 and 39% range and normal antegrade vertebral flow. She has had issues with lower extremity edema exacerbated by sodium intake.  She has had recurrent urinary infections and underwent bladder surgery on 2 occasions in 2023 and also had undergone right total knee replacement in May 2023.  She developed COVID in December 2023 around Christmas.  Presently, her blood pressure is minimally increased on her current regimen of metoprolol succinate 25 mg daily.  She has been wearing support stockings and this has  significantly improved her previous lower extremity edema.  She continues to be on atorvastatin for hyperlipidemia.  She is followed by Dr. Farris Has who checks laboratory.  She tells me her mother had lower extremity circulatory issues.  She has some vascular insufficiency.  For screening I have suggested she undergo a lower extremity ABI.  She will follow-up with Rebecca Porter for primary care.  I will see her in 1 year for follow-up Cardiologic evaluation.   Rebecca. Girdner continues to feel well.  She denies any chest pain or exertional dyspnea.  Since her last evaluation with me it appears she may have suffered a small TIA when she had sudden transient difficulty in word finding difficulty and could not get the words out.  There was no associated slurred speech or facial droop, numbness or weakness or visual disturbance.  Symptoms resolved in 10 to 15 minutes.  Upon presentation today her blood pressure was elevated initially at 174/90 but on repeat by me was 132/72.  She states yesterday she ate at the [employer] and her food was very salty.  This has been associated now also with 1-2+ lower extremity edema bilaterally.  I am recommending that she initiate HCTZ 12.5 mg and have also recommended support stockings with 20 to 30 mm pressure support.  I reviewed   Most recent LDL cholesterol in August was 77.  She has been on atorvastatin 20 mg.  I have recommended more aggressive intervention and agree with recent titration to 40 mg with target LDL less than 70.  I advised her on sodium restriction.  BMI is consistent with moderate obesity, weight loss and exercise was recommended.  I will see her in in 1 year for follow-up evaluation or sooner as needed.   Medication Adjustments/Labs and Tests Ordered: Current medicines are reviewed at length with the patient today.  Concerns regarding medicines are outlined above.  Medication changes, Labs and Tests ordered today are listed in the Patient Instructions  below. Patient Instructions  Medication Instructions:  Your physician recommends that you continue on your current medications as directed. Please refer to the Current Medication list given to you today.  *If you need a refill on your cardiac medications before your next appointment, please call your pharmacy*  Testing/Procedures: Your physician has requested that you have an ankle brachial index (ABI). During this test an ultrasound and blood pressure cuff are used to evaluate the arteries that supply the arms and legs with blood. Allow thirty minutes for this exam. There are no restrictions or special instructions.  Follow-Up: At [facility], you and your health needs are our priority.  As part of our continuing mission to provide you with exceptional heart care, we have created designated Provider Care Teams.  These Care Teams include your primary Cardiologist (physician) and Advanced Practice Providers (APPs -  Physician Assistants and Nurse Practitioners) who all work together to provide you with the care you need, when you need it.  We recommend signing up for the patient portal called "MyChart".  Sign up information is provided on this After Visit Summary.  MyChart is used to connect with patients for Virtual Visits (Telemedicine).  Patients are able to view lab/test results, encounter notes, upcoming appointments, etc.  Non-urgent messages can be sent to your provider as well.   To learn more about what you can do with MyChart, go to [email or web address].    Your next appointment:   12 month(s)  Provider:   Nicki Guadalajara, MD        Signed, Rebecca Guadalajara, MD  07/25/2022 2:08 PM    [facility] [address], [place], [place]  [address] Phone: [phone]

## 2022-07-25 ENCOUNTER — Encounter: Payer: Self-pay | Admitting: Cardiovascular Disease

## 2022-08-05 ENCOUNTER — Ambulatory Visit (HOSPITAL_COMMUNITY)
Admission: RE | Admit: 2022-08-05 | Discharge: 2022-08-05 | Disposition: A | Discharge status: Home / Self Care | Payer: Medicare Other | Source: Ambulatory Visit | Attending: Cardiovascular Disease | Admitting: Cardiovascular Disease

## 2022-08-05 DIAGNOSIS — I739 Peripheral vascular disease, unspecified: Secondary | ICD-10-CM | POA: Diagnosis not present

## 2022-08-05 LAB — VAS US LOWER EXT ART SEG MULTI (SEGMENTALS & LE RAYNAUDS)
Left ABI: 1.14
Right ABI: 1.21

## 2022-08-14 ENCOUNTER — Other Ambulatory Visit: Payer: Self-pay | Admitting: Cardiovascular Disease

## 2022-11-23 DIAGNOSIS — L400 Psoriasis vulgaris: Secondary | ICD-10-CM | POA: Diagnosis not present

## 2022-12-24 DIAGNOSIS — N3000 Acute cystitis without hematuria: Secondary | ICD-10-CM | POA: Diagnosis not present

## 2022-12-24 DIAGNOSIS — N3941 Urge incontinence: Secondary | ICD-10-CM | POA: Diagnosis not present

## 2023-01-07 DIAGNOSIS — E785 Hyperlipidemia, unspecified: Secondary | ICD-10-CM | POA: Diagnosis not present

## 2023-01-07 DIAGNOSIS — R7303 Prediabetes: Secondary | ICD-10-CM | POA: Diagnosis not present

## 2023-01-07 DIAGNOSIS — E559 Vitamin D deficiency, unspecified: Secondary | ICD-10-CM | POA: Diagnosis not present

## 2023-01-07 DIAGNOSIS — R300 Dysuria: Secondary | ICD-10-CM | POA: Diagnosis not present

## 2023-01-07 DIAGNOSIS — R030 Elevated blood-pressure reading, without diagnosis of hypertension: Secondary | ICD-10-CM | POA: Diagnosis not present

## 2023-03-30 DIAGNOSIS — R350 Frequency of micturition: Secondary | ICD-10-CM | POA: Diagnosis not present

## 2023-03-30 DIAGNOSIS — N3941 Urge incontinence: Secondary | ICD-10-CM | POA: Diagnosis not present

## 2023-03-30 DIAGNOSIS — R300 Dysuria: Secondary | ICD-10-CM | POA: Diagnosis not present

## 2023-05-31 DIAGNOSIS — Z6836 Body mass index (BMI) 36.0-36.9, adult: Secondary | ICD-10-CM | POA: Diagnosis not present

## 2023-05-31 DIAGNOSIS — Z124 Encounter for screening for malignant neoplasm of cervix: Secondary | ICD-10-CM | POA: Diagnosis not present

## 2023-06-08 ENCOUNTER — Other Ambulatory Visit: Payer: Self-pay | Admitting: Family Medicine

## 2023-06-08 DIAGNOSIS — Z1231 Encounter for screening mammogram for malignant neoplasm of breast: Secondary | ICD-10-CM

## 2023-06-24 DIAGNOSIS — L821 Other seborrheic keratosis: Secondary | ICD-10-CM | POA: Diagnosis not present

## 2023-06-24 DIAGNOSIS — D485 Neoplasm of uncertain behavior of skin: Secondary | ICD-10-CM | POA: Diagnosis not present

## 2023-06-24 DIAGNOSIS — L814 Other melanin hyperpigmentation: Secondary | ICD-10-CM | POA: Diagnosis not present

## 2023-06-24 DIAGNOSIS — L820 Inflamed seborrheic keratosis: Secondary | ICD-10-CM | POA: Diagnosis not present

## 2023-07-12 ENCOUNTER — Ambulatory Visit: Payer: Medicare Other

## 2023-07-12 DIAGNOSIS — R5383 Other fatigue: Secondary | ICD-10-CM | POA: Diagnosis not present

## 2023-07-12 DIAGNOSIS — Z7409 Other reduced mobility: Secondary | ICD-10-CM | POA: Diagnosis not present

## 2023-07-12 DIAGNOSIS — F322 Major depressive disorder, single episode, severe without psychotic features: Secondary | ICD-10-CM | POA: Diagnosis not present

## 2023-07-12 DIAGNOSIS — R030 Elevated blood-pressure reading, without diagnosis of hypertension: Secondary | ICD-10-CM | POA: Diagnosis not present

## 2023-07-12 DIAGNOSIS — Z0000 Encounter for general adult medical examination without abnormal findings: Secondary | ICD-10-CM | POA: Diagnosis not present

## 2023-07-12 DIAGNOSIS — E559 Vitamin D deficiency, unspecified: Secondary | ICD-10-CM | POA: Diagnosis not present

## 2023-07-12 DIAGNOSIS — R7303 Prediabetes: Secondary | ICD-10-CM | POA: Diagnosis not present

## 2023-07-12 DIAGNOSIS — E785 Hyperlipidemia, unspecified: Secondary | ICD-10-CM | POA: Diagnosis not present

## 2023-07-15 ENCOUNTER — Ambulatory Visit

## 2023-07-19 ENCOUNTER — Ambulatory Visit
Admission: RE | Admit: 2023-07-19 | Discharge: 2023-07-19 | Disposition: A | Discharge status: Home / Self Care | Source: Ambulatory Visit | Attending: Family Medicine | Admitting: Family Medicine

## 2023-07-19 DIAGNOSIS — Z1231 Encounter for screening mammogram for malignant neoplasm of breast: Secondary | ICD-10-CM

## 2023-08-11 DIAGNOSIS — F322 Major depressive disorder, single episode, severe without psychotic features: Secondary | ICD-10-CM | POA: Diagnosis not present

## 2023-08-11 DIAGNOSIS — E785 Hyperlipidemia, unspecified: Secondary | ICD-10-CM | POA: Diagnosis not present

## 2023-08-30 ENCOUNTER — Ambulatory Visit: Admitting: Rehabilitative and Restorative Service Providers"

## 2023-09-10 ENCOUNTER — Ambulatory Visit (INDEPENDENT_AMBULATORY_CARE_PROVIDER_SITE_OTHER)

## 2023-09-10 ENCOUNTER — Ambulatory Visit (INDEPENDENT_AMBULATORY_CARE_PROVIDER_SITE_OTHER): Admitting: Podiatry

## 2023-09-10 DIAGNOSIS — L84 Corns and callosities: Secondary | ICD-10-CM | POA: Diagnosis not present

## 2023-09-10 DIAGNOSIS — M795 Residual foreign body in soft tissue: Secondary | ICD-10-CM

## 2023-09-10 NOTE — Patient Instructions (Signed)
  Choose a moisturizer from  the list below:  For normal skin: Moisturize feet once daily; do not apply between toes A.  CeraVe Daily Moisturizing Lotion B.  Lubriderm Advanced Therapy Lotion or Lubriderm Intense Skin Repair Lotion C.  Aquaphor Intensive Repair Lotion D.  Gold Bond Ultimate Diabetic Foot Lotion E.  Eucerin Intensive Repair Moisturizing Lotion  For extremely dry, cracked feet: moisturize feet once daily; do not apply between toes A. CeraVe Healing Ointment B. Eucerin Aquaphor Advanced Repair Cream C. Vaseline Petroleum Healing Jelly   If you have problems reaching your feet: apply to feet once daily; do not apply between toes A. Aquaphor Advanced Therapy Ointment Body Spray  B.  Vaseline Intensive Care Spray Moisturizer (Unscented,  Cocoa Radiant Spray or Aloe Smooth Spray)

## 2023-09-10 NOTE — Progress Notes (Signed)
 Subjective: Chief Complaint  Patient presents with   Callouses    RM 11 Patient is here for calluses on right foot and possible callus by left heel.   83 year old female presents the office today with concerns of painful calluses on the right foot worse than left.  She states that she is a lot which causes discomfort given the calluses.  She has tried inserts inside of her shoes.  She has not any open lesions or any drainage.  She also has concerns of a spot on the bottom of her left heel next where she has a callus and she is not sure if there is a foreign object in there or not.  She does not recall stepping on an object.  No recent treatment.  No swelling or redness or any drainage.  Objective: AAO x3, NAD DP/PT pulses palpable bilaterally, CRT less than 3 seconds On the right foot worse than the left there is hyperkeratotic tissue noted to the plantar aspect the heel as well as the right submetatarsal 1 area on the right submetatarsal 1 there is some dried blood present but there is no underlying ulceration, drainage or signs of infection.  The left heel just lateral to the area of the callus is a punctate annular there is thickened tissue present once I debrided this there was some bleeding present but there was no definitive foreign body identified.  There is no drainage or pus. No pain with calf compression, swelling, warmth, erythema  Assessment: Hyperkeratotic lesions, possible residual foreign body  Plan: -All treatment options discussed with the patient including all alternatives, risks, complications.  -X-rays were obtained reviewed of the left foot.  There is no evidence of acute fracture.  No obvious foreign object identified at this time. -I sharply debrided the area of the concern for the foreign object in the left, heel without any complications.  I debrided this down to healthy, bleeding tissue but there was no definitive foreign body identified.  Recommend Epsom salt soaks.   Antibiotic ointment followed by dressing daily.  Monitor for any signs or symptoms of infection.  Symptoms persist will likely get an ultrasound. -I debrided the calluses x 3 without any complications or bleeding as a courtesy.  Moisturizer, offloading. -Patient encouraged to call the office with any questions, concerns, change in symptoms.     Callus heel b/l right sub 1  ?foreign body left lateral heel

## 2023-09-11 DIAGNOSIS — E785 Hyperlipidemia, unspecified: Secondary | ICD-10-CM | POA: Diagnosis not present

## 2023-09-11 DIAGNOSIS — F322 Major depressive disorder, single episode, severe without psychotic features: Secondary | ICD-10-CM | POA: Diagnosis not present

## 2023-09-16 ENCOUNTER — Ambulatory Visit

## 2023-09-22 DIAGNOSIS — R5383 Other fatigue: Secondary | ICD-10-CM | POA: Diagnosis not present

## 2023-09-22 DIAGNOSIS — F32A Depression, unspecified: Secondary | ICD-10-CM | POA: Diagnosis not present

## 2023-09-27 ENCOUNTER — Telehealth: Payer: Self-pay | Admitting: Cardiovascular Disease

## 2023-09-27 NOTE — Telephone Encounter (Signed)
 Spoke with pt's daughter (per DPR) regarding an echocardiogram. Daughter stated that the pt's PCP recommended she have an echo since she has not had one since 2022. Daughter was told her request would be forwarded to Dr. Loetta Ringer and his covering. Daughter verbalized understanding. All questions if any were answered.

## 2023-09-27 NOTE — Telephone Encounter (Signed)
 Per Daughter PCP recommends she has a Echo since it has been a while. Please advise

## 2023-10-06 DIAGNOSIS — F32A Depression, unspecified: Secondary | ICD-10-CM | POA: Diagnosis not present

## 2023-10-07 ENCOUNTER — Encounter: Payer: Self-pay | Admitting: Cardiovascular Disease

## 2023-10-07 ENCOUNTER — Ambulatory Visit: Attending: Cardiovascular Disease | Admitting: Cardiovascular Disease

## 2023-10-07 VITALS — BP 114/72 | HR 66 | Ht 66.5 in | Wt 220.2 lb

## 2023-10-07 DIAGNOSIS — I10 Essential (primary) hypertension: Secondary | ICD-10-CM | POA: Diagnosis not present

## 2023-10-07 DIAGNOSIS — I071 Rheumatic tricuspid insufficiency: Secondary | ICD-10-CM | POA: Diagnosis not present

## 2023-10-07 DIAGNOSIS — I340 Nonrheumatic mitral (valve) insufficiency: Secondary | ICD-10-CM | POA: Diagnosis not present

## 2023-10-07 DIAGNOSIS — R600 Localized edema: Secondary | ICD-10-CM | POA: Diagnosis not present

## 2023-10-07 DIAGNOSIS — R0609 Other forms of dyspnea: Secondary | ICD-10-CM

## 2023-10-07 DIAGNOSIS — G459 Transient cerebral ischemic attack, unspecified: Secondary | ICD-10-CM

## 2023-10-07 DIAGNOSIS — I447 Left bundle-branch block, unspecified: Secondary | ICD-10-CM

## 2023-10-07 HISTORY — DX: Left bundle-branch block, unspecified: I44.7

## 2023-10-07 MED ORDER — ATORVASTATIN CALCIUM 40 MG PO TABS: 40.0000 mg | ORAL_TABLET | Freq: Every day | ORAL | 4 refills | Status: AC

## 2023-10-07 MED ORDER — METOPROLOL SUCCINATE ER 25 MG PO TB24: 25.0000 mg | ORAL_TABLET | Freq: Every day | ORAL | 4 refills | Status: AC

## 2023-10-07 NOTE — Progress Notes (Signed)
 Cardiology Office Note    Date:  10/17/2023   ID:  IRA Porter, DOB 1940/07/30, MRN [id number]  PCP:  Kip Righter, MD  Cardiologist:  Debby Sor, MD   14 month F/U cardiololgy evaluation   History of Present Illness:  Rebecca Porter is a 83 y.o. female who presents to the office today for a 14 month follow-up cardiology evaluation.  Rebecca Porter has a history of a left kidney stone leading to hospitalization in July 2019 with urosepsis secondary to E. coli sensitive to ertapenem .  She subsequently developed low back pain and was felt to have L4-L5 epidural abscess and discitis leading to her hospitalization at [facility] in August 2019.  She was evaluated by neurology and had a PICC line inserted for 6 weeks of antibiotic therapy.  She has noticed mild shortness of breath and progressive exertional dyspnea bu.  o urologic surgery and audiology clearance was recommended prior to surgery due to new left bundle branch block.  Of note, the patient states that she was evaluated July and ECG at that time was done.  I was able to obtain the ECG report but not see the tracing and reportedly this was interpreted as sinus tachycardia with anteroseptal infarct, age undetermined, ST abnormality with possible lateral subendocardial injury and was felt to be abnormal without prior ECG available for comparison.  When I saw her for initial cardiology evaluation, she denied any episodes of chest pressure prior MI but admitted to mild progressive shortness of breath with exertion and had noticed some leg swelling.  Preoperative echo Doppler study and nuclear stress testing.  Her nuclear stress test revealed an EF of 46%.  There were no ECG changes.  There was a medium defect of moderate severity in the anteroseptal septal wall and it was felt most likely this was due to left bundle branch block attenuation artifact but anteroseptal ischemia could not be completely excluded.  An echo Doppler study revealed an  ejection fraction at 55% without wall motion abnormalities.  She had mild PA pressure elevation at 41 mm.  She was given clearance to undergo her urologic surgery which she was able to tolerate well without cardiovascular compromise.  She states she has lost 34 pounds over 4 months.  She did not tolerate lisinopril due to cough.  He denies recent chest pain or shortness of breath.  I saw her in November 2019.  At that time her blood pressure was elevated and her resting pulse was 90.  I added Toprol -XL 25 mg to her medical regimen.    I saw her in September 2020.  Over the prior year, her blood pressure at home typically was running in the 120s to 130s.  She had noticed some intermittent ankle swelling.  She had undergone laboratory by her primary physician Dr. Righter Kip in February 2020.  Her total cholesterol was 222 and LDL cholesterol 130.  She was not started on any lipid-lowering therapy.  She has been on metoprolol  25 mg daily.  During that evaluation, her blood pressure remained elevated and with a history of some intermittent mild ankle swelling I gave her a prescription for HCTZ to take on an as needed basis for swelling and if her blood pressure remained in the 130 or above range.  I saw her on March 04, 2020 and over the prior year she had continued to do well..  However she admits to a 25 pound weight gain since the Covid  pandemic began.  She did receive the Covid vaccine.  She continues to be followed by Dr. Kip who saw her in September 2021. Her blood pressure at home typically is less than 130/80.  She denied any significant swelling.    I  saw her on March 27, 2021.  Apparently she was felt to have had a TIA in April 2022 when she suddenly had word finding difficulty and could not get the words out.  There was no associated slurred speech or facial droop, numbness or weakness or visual disturbance.  Symptoms resolved after approximately 10 to 15 minutes.  She has been evaluated  by Dr. Skeet of neurology.  Subsequently, she underwent carotid Doppler studies on September 7 which showed mild 1 to 39% plaque in both carotids and normal vertebral flow.  A 2D echo Doppler study on December 31, 2020 showed EF at 50 to 55% with grade 1 diastolic dysfunction.  There was mild MR and moderate TR.  Presently, she denies any chest pain or recurrent neurologic symptoms.  She does experience intermittent lower extremity edema bilaterally but she states she recently ate at the [employer] and her food was very salty.  She has been on atorvastatin  20 mg daily and LDL cholesterol in August 2022 was 77.  She is on metoprolol  succinate 25 mg daily.  During that evaluation her ECG was stable showing sinus rhythm at 65 bpm.  Her blood pressure was elevated.  With lower extremity edema I recommended she initiate HCTZ 12.5 mg and recommended support stockings with 20 to 30 mm pressure support.  I last saw her on July 20, 2022.  She has had recurrent urinary tract infections and had undergone bladder surgeries in February and March 2023.  In May 2023 she underwent total right knee replacement with Dr. Willaim.  She developed COVID in December 2023.  She denied any chest pain.  She has had issues with lower extremity edema.  She sees Dr. Kip for primary care.  She was wearing support stockings which had significantly improved previous lower extremity edema.  During that evaluation she informed me that her mother had lower extremity circulatory issues.  She has vascular insufficiency.  I suggested she undergo screening lower extremity ABIs.  She underwent a lower extremity Doppler study on August 05, 2022 which was normal.  Since I last saw her, she now sees Dr. Adelita Schooner for primary care. 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  She states her legs are weak particularly when she walks and she does get winded when walking.  She continues to see Dr. Marykay for urologic  issues.  She denies any chest pain or shortness of breath.  She underwent laboratory at [facility] in March 2025.  CBC was stable with hemoglobin 13.5 hematocrit 39.8.  Platelets 1 89,000.  Renal function was normal with creatinine 1.02.  LFTs were normal.  TSH was 1.74.  Lipid studies in September 2024 showed total cholesterol 172 HDL 67 LDL 91 triglycerides 74.  She presents for evaluation.   Past Medical History:  Diagnosis Date   Arthritis    Breast cancer (HCC)    Cancer (HCC)    breast cancer 2002    Chronic kidney disease    GERD (gastroesophageal reflux disease)    History of kidney stones    Hypertension    Personal history of radiation therapy 2002   Sepsis (HCC)    due to kidney stone blocking the flow per  pt. family  10-2017  IV INVANZ    TIA (transient ischemic attack)    09/04/20    Past Surgical History:  Procedure Laterality Date   BREAST BIOPSY Left    malignant stereo    BREAST LUMPECTOMY Left 2002   CHOLECYSTECTOMY     1997   CYSTOSCOPY     Left stone removal 12-31-17   CYSTOSCOPY/URETEROSCOPY/HOLMIUM LASER/STENT PLACEMENT Left 01/10/2018   Procedure: CYSTOSCOPY, URETEROSCOPY,RETROGRADE PYLEGRAM,BASKET STONE EXTRACTION, AND  STENT REMOVAL;  Surgeon: Ottelin, Mark, MD;  Location: [facility];  Service: Urology;  Laterality: Left;   Rebecca LUMBAR DISC ASPIRATION W/IMG GUIDE  11/29/2017   abscess on spine  between L5 and S1   On IV Invanz     RENAL ARTERY STENT     left November 07 2017   TOTAL KNEE ARTHROPLASTY Right 08/25/2021   Procedure: TOTAL KNEE ARTHROPLASTY;  Surgeon: Melodi Lerner, MD;  Location: [facility];  Service: Orthopedics;  Laterality: Right;    Current Medications: Outpatient Medications Prior to Visit  Medication Sig Dispense Refill   Cholecalciferol  (VITAMIN D ) 50 MCG (2000 UT) CAPS Take 2,000 Units by mouth daily.     nystatin (MYCOSTATIN) 100000 UNIT/ML suspension Take 5 mLs by mouth 3 (three) times daily as needed (thrush).     oxybutynin  (DITROPAN  XL) 15 MG 24  hr tablet Take 15 mg by mouth daily.     sertraline (ZOLOFT) 50 MG tablet Take 50 mg by mouth daily.     atorvastatin  (LIPITOR) 40 MG tablet Take 40 mg by mouth daily.     metoprolol  succinate (TOPROL -XL) 25 MG 24 hr tablet Take 1 tablet by mouth once daily 90 tablet 3   methocarbamol  (ROBAXIN ) 500 MG tablet Take 1 tablet (500 mg total) by mouth every 6 (six) hours as needed for muscle spasms. 40 tablet 0   oxyCODONE  (OXY Rebecca/ROXICODONE ) 5 MG immediate release tablet Take 1-2 tablets (5-10 mg total) by mouth every 6 (six) hours as needed for severe pain. Not to exceed 6 tablets a day. 42 tablet 0   polyvinyl alcohol (LIQUIFILM TEARS) 1.4 % ophthalmic solution Place 1 drop into both eyes as needed for dry eyes.     traMADol  (ULTRAM ) 50 MG tablet Take 1-2 tablets (50-100 mg total) by mouth every 6 (six) hours as needed for moderate pain. 40 tablet 0   trimethoprim (TRIMPEX) 100 MG tablet Take 100 mg by mouth daily.     No facility-administered medications prior to visit.     Allergies:   Ciprofloxacin hcl, Penicillins, Macrobid [nitrofurantoin macrocrystal], and Protonix [pantoprazole sodium]   Social History   Socioeconomic History   Marital status: Widowed    Spouse name: Not on file   Number of children: Not on file   Years of education: Not on file   Highest education level: Not on file  Occupational History   Not on file  Tobacco Use   Smoking status: Never   Smokeless tobacco: Never  Vaping Use   Vaping status: Never Used  Substance and Sexual Activity   Alcohol use: Never   Drug use: Never   Sexual activity: Not Currently  Other Topics Concern   Not on file  Social History Narrative   Right handed   No caffeine   One story home   Social Drivers of Health   Financial Resource Strain: Not on file  Food Insecurity: Not on file  Transportation Needs: Not on file  Physical Activity: Not on file  Stress: Not on file  Social Connections: Not on file    Social history is  notable that she is widowed for >10 years.  She has 2 children, 4 grandchildren, and 2 great-grandchildren.  She is self-employed and is in [employer].  There is no tobacco history.  She does not exercise.  There is no alcohol use.  Family History:  The patient's family history is not on file.   Family history is notable for mother dying at age 76 with colon cancer.  Her father died at age 51 but has suffered a massive heart attack in 72.    ROS General: Negative; No fevers, chills, or night sweats;  HEENT: Negative; No changes in vision or hearing, sinus congestion, difficulty swallowing Pulmonary: Negative; No cough, wheezing, shortness of breath, hemoptysis Cardiovascular: Positive for exertional dyspnea.  No chest pain or palpitations; no awareness of prior MI; positive for leg swelling GI: Negative; No nausea, vomiting, diarrhea, or abdominal pain GU: Positive for history of urosepsis and left kidney stone with subsequent stenting Musculoskeletal: Right knee discomfort which has limited her activity. Hematologic/Oncology: Negative; no easy bruising, bleeding Endocrine: Negative; no heat/cold intolerance; no diabetes Neuro: Sciatica symptoms from the spine abscess; probable TIA April 2022 Skin: Negative; No rashes or skin lesions Psychiatric: Negative; No behavioral problems, depression Sleep: Negative; No snoring, daytime sleepiness, hypersomnolence, bruxism, restless legs, hypnogognic hallucinations, no cataplexy Other comprehensive 14 point system review is negative.   PHYSICAL EXAM:   VS:  BP 114/72   Pulse 66   Ht 5' 6.5 (1.689 m)   Wt 220 lb 3.2 oz (99.9 kg)   SpO2 96%   BMI 35.01 kg/m     Repeat blood pressure by me 116/70  Wt Readings from Last 3 Encounters:  10/07/23 220 lb 3.2 oz (99.9 kg)  07/20/22 222 lb (100.7 kg)  08/25/21 217 lb (98.4 kg)     General: Alert, oriented, no distress.  Skin: normal turgor, no rashes, warm and dry HEENT:  Normocephalic, atraumatic. Pupils equal round and reactive to light; sclera anicteric; extraocular muscles intact;  Nose without nasal septal hypertrophy Mouth/Parynx benign; Mallinpatti scale 3 Neck: No JVD, no carotid bruits; normal carotid upstroke Lungs: clear to ausculatation and percussion; no wheezing or rales Chest wall: without tenderness to palpitation Heart: PMI not displaced, RRR, s1 s2 normal, 1/6 systolic murmur, no diastolic murmur, no rubs, gallops, thrills, or heaves Abdomen: soft, nontender; no hepatosplenomehaly, BS+; abdominal aorta nontender and not dilated by palpation. Back: no CVA tenderness Pulses 2+ Musculoskeletal: full range of motion, normal strength, no joint deformities Extremities: Trivial ankle edema;no clubbing cyanosis or edema, Homan's sign negative  Neurologic: grossly nonfocal; Cranial nerves grossly wnl Psychologic: Normal mood and affect    Studies/Labs Reviewed:   EKG Interpretation Date/Time:  Thursday October 07 2023 08:47:26 EDT Ventricular Rate:  66 PR Interval:  178 QRS Duration:  120 QT Interval:  444 QTC Calculation: 465 R Axis:   -50  Text Interpretation: Normal sinus rhythm Left axis deviation Low voltage QRS Incomplete left bundle branch block Nonspecific T wave abnormality When compared with ECG of 22-Dec-2017 14:42, Non-specific change in ST segment in Anterior leads T wave inversion no longer evident in Lateral leads Confirmed by Burnard Ned ([id number]) on 10/07/2023 8:59:43 AM    July 20, 2022 ECG (independently read by me): NSR at 66, LBBB, no ectopy  March 27, 2021 ECG (independently read by me): Sinus rhythm at 65; PVC  March 04, 2021 ECG (independently read by me): NSR  at 64; LBBB; QTc 466 msec  September 2020 ECG (independently read by me): Normal sinus rhythm at 60 bpm, left bundle branch block, QTc interval 456 Rebecca  February 11, 2018 ECG (independently read by me): Normal sinus rhythm at 90 bpm.  Left bundle branch  block with repolarization changes.  QTc interval 481 Rebecca.  December 28, 2017 ECG (independently read by me): Normal sinus rhythm at 88 bpm.  Left bundle branch block with repolarization changes.  Poor R wave progression V1 through V6.  December 22, 2017 ECG (independently reviewed by me): Normal sinus rhythm.  Left bundle branch block with repolarization changes.   Recent Labs:    Latest Ref Rng & Units 08/26/2021    3:15 AM 08/12/2021    2:59 PM 12/22/2017    2:52 PM  BMP  Glucose 70 - 99 mg/dL 851  891  89   BUN 8 - 23 mg/dL 24  20  22    Creatinine 0.44 - 1.00 mg/dL 8.84  8.84  9.14   Sodium 135 - 145 mmol/L 137  140  143   Potassium 3.5 - 5.1 mmol/L 5.1  3.9  3.7   Chloride 98 - 111 mmol/L 109  108  108   CO2 22 - 32 mmol/L 23  27  26    Calcium  8.9 - 10.3 mg/dL 9.0  9.6  9.6         Latest Ref Rng & Units 08/12/2021    2:59 PM 12/30/2017   10:22 AM 11/26/2017    7:42 PM  Hepatic Function  Total Protein 6.5 - 8.1 g/dL 7.2  6.4  6.0   Albumin 3.5 - 5.0 g/dL 3.6  3.5  2.4   AST 15 - 41 U/L 17  20  16    ALT 0 - 44 U/L 12  15  21    Alk Phosphatase 38 - 126 U/L 87  98  145   Total Bilirubin 0.3 - 1.2 mg/dL 0.9  0.4  0.6   Bilirubin, Direct 0.00 - 0.40 mg/dL  9.86         Latest Ref Rng & Units 08/26/2021    3:15 AM 08/12/2021    2:59 PM 12/22/2017    2:52 PM  CBC  WBC 4.0 - 10.5 K/uL 14.1  6.4  6.7   Hemoglobin 12.0 - 15.0 g/dL 89.3  87.7  88.8   Hematocrit 36.0 - 46.0 % 33.2  37.7  34.3   Platelets 150 - 400 K/uL 227  223  222    Lab Results  Component Value Date   MCV 96.5 08/26/2021   MCV 94.3 08/12/2021   MCV 93.5 12/22/2017   Lab Results  Component Value Date   TSH 2.470 12/30/2017   No results found for: HGBA1C   BNP No results found for: BNP  ProBNP No results found for: PROBNP   Lipid Panel     Component Value Date/Time   CHOL 159 12/06/2020 1540   CHOL 186 12/30/2017 1022   TRIG 76.0 12/06/2020 1540   HDL 65.90 12/06/2020 1540   HDL 65  12/30/2017 1022   CHOLHDL 2 12/06/2020 1540   VLDL 15.2 12/06/2020 1540   LDLCALC 77 12/06/2020 1540   LDLCALC 107 (H) 12/30/2017 1022     RADIOLOGY: No results found.    Additional studies/ records that were reviewed today include:  Reviewed the patient's hospital records and obtained interpretation analysis of a prior ECG.  Recent laboratory was reviewed.  ASSESSMENT:  1. Essential hypertension   2. Dyspnea on exertion   3. TIA (transient ischemic attack): April 2022   4. Nonrheumatic mitral valve regurgitation   5. History of bilateral lower extremity edema   6. Moderate tricuspid regurgitation     PLAN:  Rebecca Porter is an 83 year-old female who developed remote urosepsis from a left kidney stone which was culture positive for extended spectrum beta-lactamase (ESBL) E. coli requiring PICC line insertion and 6 weeks of antibiotic therapy.  She has multiple allergies and required ertapenem  antibiotic therapy.  She denied any prior cardiac history.  Her ECG reported in July 2019 from [place] raise the possibility of age-indeterminate anteroseptal MI with possible lateral subendocardial injury.  Her ECG when I initially evaluated her revealed sinus rhythm with poor R wave progression and left bundle branch block with repolarization changes.  She had experienced exertional dyspnea and increased shortness of breath as well as leg edema and subsequently underwent a nuclear perfusion study and echo Doppler assessment.  Her EF was normal on her echo study without significant valvular pathology and there was evidence for mild grade 1 diastolic dysfunction.  There was mild TR and mildly increased PA pressure.  Her nuclear perfusion study was low risk which showed mild anteroseptal defect most likely due to her underlying left bundle branch block.  In April 2022 she may have suffered a small TIA when she had sudden transient difficulty and word finding and could not get her words out. Her  last recent echo from September 2022  showed EF of 50 to 55% with grade 1 diastolic dysfunction, mild MR and moderate TR.  Carotid Doppler studies did not reveal significant stenoses with mild plaque in the 1 and 39% range and normal antegrade vertebral flow. She has had issues with lower extremity edema exacerbated by sodium intake.  She has had recurrent urinary infections and underwent bladder surgery on 2 occasions in 2023 and also had undergone right total knee replacement in May 2023.  She developed COVID in December 2023 around Christmas.  I last saw her in April 2020 for blood pressure was minimally increased on the regimen of metoprolol  succinate 25 mg daily.  Due to lower extremity edema she was wearing support stockings with benefit.  She continues to be on atorvastatin  for lipid management.  I recommended she undergo lower extremity Doppler study and she was found to have normal ABIs bilaterally.  She continues to be followed by Dr. Marykay of urology.  She is not having any chest pain.  She admits that her legs are weak particularly when she walks and at times she then gets winded.  Her blood pressure today is stable and she continues to be on metoprolol  succinate 25 mg daily.  She continues to be on atorvastatin  40 mg for lipid management.  She is now seeing Dr. Athena for primary care.  I reviewed laboratory from late March 2025 which was stable.  She is aware that I am retiring.  She will be undergoing follow-up laboratory with Dr. Athena with subsequent lipid studies.  I am recommending she undergo a 2D echo Doppler study for reassessment of LV systolic and diastolic function.  I will transition her to the care of Dr. Dawna Bruckner at our [place] office with follow-up in 1 year or sooner as needed.   Medication Adjustments/Labs and Tests Ordered: Current medicines are reviewed at length with the patient today.  Concerns regarding medicines are outlined above.  Medication changes,  Labs  and Tests ordered today are listed in the Patient Instructions below. Patient Instructions  Medication Instructions:  Your physician recommends that you continue on your current medications as directed. Please refer to the Current Medication list given to you today.  *If you need a refill on your cardiac medications before your next appointment, please call your pharmacy*   Testing/Procedures: Your physician has requested that you have an echocardiogram. Echocardiography is a painless test that uses sound waves to create images of your heart. It provides your doctor with information about the size and shape of your heart and how well your heart's chambers and valves are working. This procedure takes approximately one hour. There are no restrictions for this procedure. Please do NOT wear cologne, perfume, aftershave, or lotions (deodorant is allowed). Please arrive 15 minutes prior to your appointment time.  Please note: We ask at that you not bring children with you during ultrasound (echo/ vascular) testing. Due to room size and safety concerns, children are not allowed in the ultrasound rooms during exams. Our front office staff cannot provide observation of children in our lobby area while testing is being conducted. An adult accompanying a patient to their appointment will only be allowed in the ultrasound room at the discretion of the ultrasound technician under special circumstances. We apologize for any inconvenience.   Follow-Up: At [facility], you and your health needs are our priority.  As part of our continuing mission to provide you with exceptional heart care, our providers are all part of one team.  This team includes your primary Cardiologist (physician) and Advanced Practice Providers or APPs (Physician Assistants and Nurse Practitioners) who all work together to provide you with the care you need, when you need it.  Your next appointment:   12  month(s)  Provider:   Shelda Bruckner, MD    We recommend signing up for the patient portal called MyChart.  Sign up information is provided on this After Visit Summary.  MyChart is used to connect with patients for Virtual Visits (Telemedicine).  Patients are able to view lab/test results, encounter notes, upcoming appointments, etc.  Non-urgent messages can be sent to your provider as well.   To learn more about what you can do with MyChart, go to [email or web address].    Signed, Debby Sor, MD  10/17/2023 1:12 PM    [facility] [address], [place], [place]  [address] Phone: [phone]

## 2023-10-07 NOTE — Patient Instructions (Signed)
 Medication Instructions:  Your physician recommends that you continue on your current medications as directed. Please refer to the Current Medication list given to you today.  *If you need a refill on your cardiac medications before your next appointment, please call your pharmacy*   Testing/Procedures: Your physician has requested that you have an echocardiogram. Echocardiography is a painless test that uses sound waves to create images of your heart. It provides your doctor with information about the size and shape of your heart and how well your heart's chambers and valves are working. This procedure takes approximately one hour. There are no restrictions for this procedure. Please do NOT wear cologne, perfume, aftershave, or lotions (deodorant is allowed). Please arrive 15 minutes prior to your appointment time.  Please note: We ask at that you not bring children with you during ultrasound (echo/ vascular) testing. Due to room size and safety concerns, children are not allowed in the ultrasound rooms during exams. Our front office staff cannot provide observation of children in our lobby area while testing is being conducted. An adult accompanying a patient to their appointment will only be allowed in the ultrasound room at the discretion of the ultrasound technician under special circumstances. We apologize for any inconvenience.   Follow-Up: At [facility], you and your health needs are our priority.  As part of our continuing mission to provide you with exceptional heart care, our providers are all part of one team.  This team includes your primary Cardiologist (physician) and Advanced Practice Providers or APPs (Physician Assistants and Nurse Practitioners) who all work together to provide you with the care you need, when you need it.  Your next appointment:   12 month(s)  Provider:   Shelda Bruckner, MD    We recommend signing up for the patient portal called  MyChart.  Sign up information is provided on this After Visit Summary.  MyChart is used to connect with patients for Virtual Visits (Telemedicine).  Patients are able to view lab/test results, encounter notes, upcoming appointments, etc.  Non-urgent messages can be sent to your provider as well.   To learn more about what you can do with MyChart, go to [email or web address].

## 2023-10-08 DIAGNOSIS — N3941 Urge incontinence: Secondary | ICD-10-CM | POA: Diagnosis not present

## 2023-10-08 DIAGNOSIS — N3000 Acute cystitis without hematuria: Secondary | ICD-10-CM | POA: Diagnosis not present

## 2023-10-11 DIAGNOSIS — E785 Hyperlipidemia, unspecified: Secondary | ICD-10-CM | POA: Diagnosis not present

## 2023-10-11 DIAGNOSIS — F322 Major depressive disorder, single episode, severe without psychotic features: Secondary | ICD-10-CM | POA: Diagnosis not present

## 2023-10-17 ENCOUNTER — Encounter: Payer: Self-pay | Admitting: Cardiovascular Disease

## 2023-10-29 ENCOUNTER — Encounter (HOSPITAL_COMMUNITY): Payer: Self-pay | Admitting: Urology

## 2023-10-29 ENCOUNTER — Other Ambulatory Visit: Payer: Self-pay | Admitting: Urology

## 2023-10-29 DIAGNOSIS — R8271 Bacteriuria: Secondary | ICD-10-CM | POA: Diagnosis not present

## 2023-10-29 DIAGNOSIS — N3000 Acute cystitis without hematuria: Secondary | ICD-10-CM | POA: Diagnosis not present

## 2023-10-29 DIAGNOSIS — K5730 Diverticulosis of large intestine without perforation or abscess without bleeding: Secondary | ICD-10-CM | POA: Diagnosis not present

## 2023-10-29 DIAGNOSIS — N200 Calculus of kidney: Secondary | ICD-10-CM | POA: Diagnosis not present

## 2023-10-29 DIAGNOSIS — N201 Calculus of ureter: Secondary | ICD-10-CM | POA: Diagnosis not present

## 2023-10-29 DIAGNOSIS — K449 Diaphragmatic hernia without obstruction or gangrene: Secondary | ICD-10-CM | POA: Diagnosis not present

## 2023-10-29 NOTE — Progress Notes (Addendum)
 Preliminary review from chart have not spoken with patient yet  For Anesthesia: PCP - [employer], MD  Cardiologist - Burnard Debby LABOR, MD last office visit note 10/07/23 in [facility] Neurologist- Skeet Juliene SAUNDERS, DO   Bowel Prep reminder:  Chest x-ray - greater than 1 year in [facility] EKG - 10/06/23 in [facility] Stress Test - greater than 2 years in [facility] ECHO - greater than 2 years in [facility] Cardiac Cath -  Pacemaker/ICD device last checked: Pacemaker orders received: Device Rep notified:  Spinal Cord Stimulator:  Sleep Study -  CPAP -   Fasting Blood Sugar -  Checks Blood Sugar _____ times a day Date and result of last Hgb A1c-  Last dose of GLP1 agonist-  GLP1 instructions:   Last dose of SGLT-2 inhibitors-  SGLT-2 instructions:   Blood Thinner Instructions: Aspirin Instructions: Last Dose:  Activity level: Can go up a flight of stairs and activities of daily living without stopping and without chest pain and/or shortness of breath   Able to exercise without chest pain and/or shortness of breath   Unable to go up a flight of stairs without chest pain and/or shortness of breath     Anesthesia review: TIA, HTN, CKD, Incompleted Left BBB  Patient denies shortness of breath, fever, cough and chest pain at PAT appointment   Patient verbalized understanding of instructions that were reviewed over the telephone.

## 2023-10-29 NOTE — Progress Notes (Signed)
 Attempted to obtain medical history via telephone, unable to reach at this time. HIPAA compliant voicemail message left requesting return call to pre surgical testing department.

## 2023-10-29 NOTE — Progress Notes (Signed)
 1656 She hasn't had the PICC since 2019.

## 2023-10-29 NOTE — Progress Notes (Signed)
 57 Spoke with Rebecca Porter, she was aware of her arrival time  66 for surgery and had received pre op instructions. Asked to take her medications by 0900.  She was able to repeat the instructions without questions.

## 2023-10-31 NOTE — Anesthesia Preprocedure Evaluation (Signed)
 Anesthesia Evaluation  Patient identified by MRN, date of birth, ID band Patient awake    Reviewed: Allergy & Precautions, NPO status , Patient's Chart, lab work & pertinent test results, reviewed documented beta blocker date and time   Airway Mallampati: III  TM Distance: >3 FB Neck ROM: Full    Dental  (+) Teeth Intact, Dental Advisory Given, Caps   Pulmonary neg pulmonary ROS   Pulmonary exam normal breath sounds clear to auscultation       Cardiovascular hypertension, Pt. on home beta blockers and Pt. on medications + DOE  Normal cardiovascular exam Rhythm:Regular Rate:Normal  TTE 2022 1. Left ventricular ejection fraction, by estimation, is 50 to 55%. The  left ventricle has low normal function. The left ventricle demonstrates  global hypokinesis. Left ventricular diastolic parameters are consistent  with Grade I diastolic dysfunction  (impaired relaxation).   2. Right ventricular systolic function is normal. The right ventricular  size is mildly enlarged. There is normal pulmonary artery systolic  pressure. The estimated right ventricular systolic pressure is 33.2 mmHg.   3. Left atrial size was moderately dilated.   4. The mitral valve is normal in structure. Mild mitral valve  regurgitation. No evidence of mitral stenosis.   5. Tricuspid valve regurgitation is moderate.   6. The aortic valve is normal in structure. Aortic valve regurgitation is  trivial. No aortic stenosis is present.   7. The inferior vena cava is normal in size with greater than 50%  respiratory variability, suggesting right atrial pressure of 3 mmHg.   Stress Test 2019  Nuclear stress EF: 46%. The left ventricular ejection fraction is mildly decreased (45-54%).  There was no ST segment deviation noted during stress.  Defect 1: There is a medium defect of moderate severity present in the basal anteroseptal, mid anteroseptal, apical anterior,  apical septal and apex location.  Findings consistent with her LBBB but also could be due to anteroseptal ischemia. The LV contractility in this region appears to be hypokinetic. The LV function is mildly depressed.  This is an intermediate risk study.     Neuro/Psych  PSYCHIATRIC DISORDERS  Depression    TIA   GI/Hepatic Neg liver ROS,GERD  ,,  Endo/Other  negative endocrine ROS    Renal/GU Renal InsufficiencyRenal disease  negative genitourinary   Musculoskeletal  (+) Arthritis ,    Abdominal   Peds  Hematology negative hematology ROS (+)   Anesthesia Other Findings   Reproductive/Obstetrics                              Anesthesia Physical Anesthesia Plan  ASA: 3  Anesthesia Plan: General   Post-op Pain Management: Tylenol  PO (pre-op)*   Induction: Intravenous  PONV Risk Score and Plan: 3 and Ondansetron , Dexamethasone  and Treatment may vary due to age or medical condition  Airway Management Planned: LMA  Additional Equipment:   Intra-op Plan:   Post-operative Plan: Extubation in OR  Informed Consent: I have reviewed the patients History and Physical, chart, labs and discussed the procedure including the risks, benefits and alternatives for the proposed anesthesia with the patient or authorized representative who has indicated his/her understanding and acceptance.     Dental advisory given  Plan Discussed with: CRNA  Anesthesia Plan Comments:          Anesthesia Quick Evaluation

## 2023-10-31 NOTE — H&P (Signed)
 Office Visit Report     10/29/2023   --------------------------------------------------------------------------------   Rebecca Porter  MRN: [id number]  DOB: 1940-10-16, 83 year old Female  SSN:    PRIMARY CARE:  Beverley [employer], MD  PRIMARY CARE FAX:  [phone]  REFERRING:  Glendia A. MacDiarmid, MD  PROVIDER:  Glendia Elizabeth, M.D.  TREATING:  Sherlyn Moats, NP  LOCATION:  [facility] [phone]     --------------------------------------------------------------------------------   CC/HPI: reviewed noted  I last saw the patient approximately 1 year ago. She has had previous InterStim dating back to March 2023. She has had positive breakthroughs on trimethoprim and it is allergic to Macrodantin. She has anaphylaxis from penicillin. We have discussed estrogen and she has been given doxycycline. She has been given fosfomycin. She has a history of breast cancer. We stopped the trimethoprim. She had a positive culture that day. Patient had normal culture in March with nurse practitioner. She saw my partner September 2024 with dysuria and 1 episode of gross hematuria. She had noted prior that doxycycline was ineffective by nurse practitioner but fosfomycin helped. She was given fosfomycin by my partner. Urine culture was negative from the visit   In the last 3 days patient has had some urgency and burning and has been using some nystatin cream. I think she is now having urge incontinence but when I asked her she has not changed settings or amplitude changes on InterStim for many months. I urged her to consider to do this.   I think many times the patient does not have bacterial cystitis but she believes she does. I encouraged her if she wishes to see a gynecologist for any creams that would help with vaginal dryness in a woman who cannot take estrogen.   A positive urine for yeast and bacteria that was sent for culture. Based on allergies I thought it was reasonable to send  from DS 1 tablet twice a day for 7 days was adamant that she wanted fosfomycin. I called in Diflucan 100 mg daily for 3 days.   Will see nurse practitioner in 3 weeks with device to review clinical follow-up. I was teasing her today when she asked me why she gets bladder infections and I went through my usual scenario which we have obviously spoken about over many years. I also addressed 1 day antibiotics and estrogen and probiotics.   Today  Frequency stable. last culture positive  Reviewed note  In the last 3 days patient has more frequency and burning. No fever. No blood. No foul-smelling urine. She did not follow-up with the nurse practitioner regarding InterStim.   The previous culture had many sensitivities but she is allergic to a number of antibiotics and has anaphylaxis with penicillin. Based upon the last culture doxycycline and sulfa looks like good choices   Patient really wanted the fosfomycin. It was sent 1 dose. She has had no recurrence of yeast. Call if culture differs and see the patient as needed   10/29/2023: 83 year old female who was recently treated for UTI with Bactrim presents today with concerns of persistent symptoms. She is having some blood in her urine associated with fatigue and overall malaise. She also reports some nausea. She denies fevers and chills. She denies flank pain. She does have a history of kidney stones.     ALLERGIES: cipro Gemtesa - Diarrhea, stuffy nose Macrobid penicillin - Anaphylaxis, at 18    MEDICATIONS: Metoprolol  Succinate ER 25 MG Tablet Extended Release 24  Hour  Atorvastatin  Calcium  40 MG Tablet tablet  Fluocinonide 0.05 % Cream  Fosfomycin Tromethamine  3 GM Packet 1 every 3 days for total of 9 days and a total of 3 treatments  Nystatin  oxyBUTYnin  Chloride ER 15 MG Tablet Extended Release 24 Hour  Probiotic  Triamcinolone  Acetonide 0.025 % Ointment  Vitamin D3 Super Strength 50 MCG (2000 UT) Tablet  Zoloft     GU PSH:  Complex cystometrogram, w/ void pressure and urethral pressure profile studies, any technique - 2023 Complex Uroflow - 2023 Cysto Remove Stent FB Sim, Left - 2019 Cystoscopy - 2021 Emg surf Electrd - 2023 Inject For cystogram - 2023 Interstim Stage 1 - 2023, 2023 Interstim Stage 2 Generator - 2023 Intrabd voidng Press - 2023 Locm 300-399Mg /Ml Iodine , - 2021 Ureteroscopic stone removal, Left - 2019     NON-GU PSH: Visit Complexity (formerly GPC1X) - 10/08/2023, 03/30/2023     GU PMH: Acute Cystitis/UTI - 10/08/2023, - 12/24/2022, - 05/28/2022, - 01/13/2022, - 2023, - 2023, - 2023 Urge incontinence - 10/08/2023, - 03/30/2023, - 12/24/2022, - 01/13/2022, - 2023, - 2023, - 2023, - 2023, - 2023, - 2023, - 2022, - 2022 Dysuria - 03/30/2023 Nocturnal Enuresis - 2023, - 2023, - 2023, - 2022, - 2022 Gross hematuria - 2023, - 2021 Urinary Frequency - 2022 Urinary Tract Inf, Unspec site - 2021, - 2021 Urinary Urgency - 2021, - 2021 Ureteral calculus, Left, We discussed the fact that I do not think lithotripsy is indicated as I cannot say 100% that this is her ureteral stone and therefore I have recommended ureteroscopic management which would allow me to perform a retrograde once stent is removed and if this is truly her left ureteral stone I could then manage it with ureteroscopy. - 2019    NON-GU PMH: No Non-GU PMH    FAMILY HISTORY: No Family History    SOCIAL HISTORY: Marital Status: Widowed    REVIEW OF SYSTEMS:    GU Review Female:   Patient reports burning /pain with urination. Patient denies frequent urination, hard to postpone urination, get up at night to urinate, leakage of urine, stream starts and stops, trouble starting your stream, have to strain to urinate, and being pregnant.  Gastrointestinal (Upper):   Patient reports nausea. Patient denies vomiting and indigestion/ heartburn.  Gastrointestinal (Lower):   Patient reports diarrhea. Patient denies constipation.   Constitutional:   Patient reports fatigue. Patient denies fever, night sweats, and weight loss.  Skin:   Patient denies skin rash/ lesion and itching.  Eyes:   Patient denies blurred vision and double vision.  Ears/ Nose/ Throat:   Patient denies sore throat and sinus problems.  Hematologic/Lymphatic:   Patient denies swollen glands and easy bruising.  Cardiovascular:   Patient denies leg swelling and chest pains.  Respiratory:   Patient reports shortness of breath. Patient denies cough.  Endocrine:   Patient denies excessive thirst.  Musculoskeletal:   Patient denies back pain and joint pain.  Neurological:   Patient reports dizziness. Patient denies headaches.  Psychologic:   Patient reports anxiety. Patient denies depression.   VITAL SIGNS:      10/29/2023 11:28 AM  BP 145/77 mmHg  Pulse 112 /min  Temperature 97.3 F / 36.2 C   GU PHYSICAL EXAMINATION:      Notes: No CVA tenderness   MULTI-SYSTEM PHYSICAL EXAMINATION:    Constitutional: Well-nourished. No physical deformities. Normally developed. Good grooming.  Cardiovascular: Normal temperature, normal extremity pulses, no swelling, no varicosities.  Skin: No paleness, no jaundice, no cyanosis. No lesion, no ulcer, no rash.  Neurologic / Psychiatric: Oriented to time, oriented to place, oriented to person. No depression, no anxiety, no agitation.  Gastrointestinal: No mass, no tenderness, no rigidity, non obese abdomen.     Complexity of Data:  Source Of History:  Patient  Records Review:   Previous Doctor Records, Previous Patient Records  Urine Test Review:   Urinalysis  X-Ray Review: C.T. Abdomen/Pelvis: Reviewed Films. Reviewed Report. Discussed With Patient.     10/29/23  Urinalysis  Urine Appearance Cloudy   Urine Color Yellow   Urine Glucose Neg mg/dL  Urine Bilirubin Neg mg/dL  Urine Ketones Neg mg/dL  Urine Specific Gravity 1.025   Urine Blood 3+ ery/uL  Urine pH 6.0   Urine Protein Neg mg/dL  Urine  Urobilinogen 0.2 mg/dL  Urine Nitrites Neg   Urine Leukocyte Esterase Neg leu/uL  Urine WBC/hpf NS (Not Seen)   Urine RBC/hpf >60/hpf   Urine Epithelial Cells 0 - 5/hpf   Urine Bacteria Rare (0-9/hpf)   Urine Mucous Not Present   Urine Yeast NS (Not Seen)   Urine Trichomonas Not Present   Urine Cystals NS (Not Seen)   Urine Casts NS (Not Seen)   Urine Sperm Not Present    PROCEDURES:         C.T. Urogram - [id number]      Patient confirmed No Neulasta OnPro Device.        PVR Ultrasound - [id number]  Scanned Volume: 8 cc         Visit Complexity - G2211          Urinalysis w/Scope Dipstick Dipstick Cont'd Micro  Color: Yellow Bilirubin: Neg mg/dL WBC/hpf: NS (Not Seen)  Appearance: Cloudy Ketones: Neg mg/dL RBC/hpf: >39/yeq  Specific Gravity: 1.025 Blood: 3+ ery/uL Bacteria: Rare (0-9/hpf)  pH: 6.0 Protein: Neg mg/dL Cystals: NS (Not Seen)  Glucose: Neg mg/dL Urobilinogen: 0.2 mg/dL Casts: NS (Not Seen)    Nitrites: Neg Trichomonas: Not Present    Leukocyte Esterase: Neg leu/uL Mucous: Not Present      Epithelial Cells: 0 - 5/hpf      Yeast: NS (Not Seen)      Sperm: Not Present    ASSESSMENT:      ICD-10 Details  1 GU:   Ureteral calculus - N20.1 Left, Acute, Systemic Symptoms  2   Acute Cystitis/UTI - N30.00 Acute, Uncomplicated, Improving   PLAN:            Medications New Meds: Trimethoprim 100 MG Tablet 1 tablet PO Q HS   #14  0 Refill(s)  Pharmacy Name:  [id number]  Address:  [address] [place], [place] [address]  Phone:  [phone]  Fax:  [phone]            Orders Labs CULTURE, URINE  X-Rays: C.T. Stone Protocol Without I.V. Contrast - blood in urine, non-clearing infection  X-Ray Notes: History:   Hematuria: Yes / No   Patient to see MD after exam: Yes/ No   Previous exam:   When:   Where:   Diabetic: Yes / No   BUN/ Creatinine:   Date of last BUN Creatinine:   Weight in pounds:   Allergy- IV Contrast: Yes/  No  Prior Authorization #: [id number]            Schedule         Document Letter(s):  Created for  Patient: Clinical Summary         Notes:   CT imaging shows a left sided UPJ stone measuring approximately 5 mm. This has some mild UPJ fullness. She continues to experience fatigue and malaise despite being treated for recent UTI. I will place her on low-dose trimethoprim after finishing Bactrim and set her up for ureteroscopy.   Urinalysis will be sent for precautionary culture today. Stone intervention was discussed in detail today. For ureteroscopy, the patient understands that there is a chance for a staged procedure. Patient also understands that there is risk for bleeding, infection, injury to surrounding organs, and general risks of anesthesia. The patient also understands the placement of a stent and the risks of stent placement including, risk for infection, the risk for pain, and the risk for injury. For ESWL, the patient understands that there is a chance of failure of procedure, there is also a risk for bruising, infection, bleeding, and injury to surrounding structures. The patient verbalized understanding to these risks.        Next Appointment:      Next Appointment: 11/01/2023 12:00 PM    Appointment Type: Surgery     Location: [facility] [phone]    Provider: Donnice Siad, M.D.    Reason for Visit: [facility]/OP CYSTO, (L) RPG, (L) URS, HLL, (L) STENT    Urology Preoperative H&P   Chief Complaint: Left UPJ stone  History of Present Illness: Rebecca Porter is a 83 y.o. female with a left UPJ stone here for cysto, L RPG, L URS/LL, left stent placement. Denies fevers, chills, dysuria. Ucx showed no growth.    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 family  10-2017  IV INVANZ    TIA (transient ischemic attack)    09/04/20    Past Surgical History:  Procedure Laterality Date   BREAST BIOPSY Left    malignant stereo    BREAST LUMPECTOMY Left 2002   CHOLECYSTECTOMY     1997   CYSTOSCOPY     Left stone removal 12-31-17   CYSTOSCOPY/URETEROSCOPY/HOLMIUM LASER/STENT PLACEMENT Left 01/10/2018   Procedure: CYSTOSCOPY, URETEROSCOPY,RETROGRADE PYLEGRAM,BASKET STONE EXTRACTION, AND  STENT REMOVAL;  Surgeon: Ottelin, Mark, MD;  Location: [facility];  Service: Urology;  Laterality: Left;   IR LUMBAR DISC ASPIRATION W/IMG GUIDE  11/29/2017   abscess on spine  between L5 and S1   On IV Invanz     RENAL ARTERY STENT     left November 07 2017   TOTAL KNEE ARTHROPLASTY Right 08/25/2021   Procedure: TOTAL KNEE ARTHROPLASTY;  Surgeon: Melodi Lerner, MD;  Location: [facility];  Service: Orthopedics;  Laterality: Right;    Allergies:  Allergies  Allergen Reactions   Ciprofloxacin Hcl Anaphylaxis, Swelling and Rash    Throat swelling   Penicillins Anaphylaxis, Hives and Other (See Comments)    Has patient had a PCN reaction causing immediate  rash, facial/tongue/throat swelling, SOB or lightheadedness with hypotension: Yes Has patient had a PCN reaction causing severe rash involving mucus membranes or skin necrosis: No Has patient had a PCN reaction that required hospitalization: unknown Has patient had a PCN reaction occurring within the last 10 years: No If all of the above answers are NO, then may proceed with Cephalosporin use.   Macrobid [Nitrofurantoin Macrocrystal] Other (See Comments)    Unknown reaction   Protonix [Pantoprazole Sodium] Other (See Comments)    Unknown reaction    Family History   Problem Relation Age of Onset   Breast cancer Neg Hx     Social History:  reports that she has never smoked. She has never used smokeless tobacco. She reports that she does not drink alcohol and does not use drugs.  ROS: A complete review of systems was performed.  All systems are negative except for pertinent findings as noted.  Physical Exam:  Vital signs in last 24 hours:   Constitutional:  Alert and oriented, No acute distress Cardiovascular: Regular rate and rhythm Respiratory: Normal respiratory effort, Lungs clear bilaterally GI: Abdomen is soft, nontender, nondistended, no abdominal masses GU: No CVA tenderness Lymphatic: No lymphadenopathy Neurologic: Grossly intact, no focal deficits Psychiatric: Normal mood and affect  Laboratory Data:  No results for input(s): WBC, HGB, HCT, PLT in the last 72 hours.  No results for input(s): NA, K, CL, GLUCOSE, BUN, CALCIUM , CREATININE in the last 72 hours.  Invalid input(s): CO3   No results found for this or any previous visit (from the past 24 hours). No results found for this or any previous visit (from the past 240 hours).  Renal Function: No results for input(s): CREATININE in the last 168 hours. CrCl cannot be calculated (Patient's most recent lab result is older than the maximum 21 days allowed.).  Radiologic Imaging: No results found.  I independently reviewed the above imaging studies.  Assessment and Plan Rebecca Porter is a 83 y.o. female with left UPJ stone here for cysto, L RPG, L URS/LL, left stent placement.   -The risks, benefits and alternatives of cystoscopy L RPG, L URS/LL, left stent placement was discussed with the patient.  Risks include, but are not limited to: bleeding, urinary tract infection, ureteral injury, ureteral stricture disease, chronic pain, urinary symptoms, bladder injury, stent migration, the need for nephrostomy tube placement, MI, CVA, DVT, PE and the  inherent risks with general anesthesia.  The patient voices understanding and wishes to proceed.    ***   Matt R. Merica Prell MD 10/31/2023, 4:53 PM  [facility] Pager: [phone]

## 2023-11-01 ENCOUNTER — Ambulatory Visit (HOSPITAL_COMMUNITY): Payer: Self-pay | Admitting: Anesthesiology

## 2023-11-01 ENCOUNTER — Ambulatory Visit (HOSPITAL_COMMUNITY)
Admission: RE | Admit: 2023-11-01 | Discharge: 2023-11-01 | Disposition: A | Discharge status: Home / Self Care | Attending: Urology | Admitting: Urology

## 2023-11-01 ENCOUNTER — Ambulatory Visit (HOSPITAL_COMMUNITY)

## 2023-11-01 ENCOUNTER — Encounter (HOSPITAL_COMMUNITY): Payer: Self-pay | Admitting: Urology

## 2023-11-01 ENCOUNTER — Encounter (HOSPITAL_COMMUNITY)
Admission: RE | Disposition: A | Discharge status: Home / Self Care | Payer: Self-pay | Source: Home / Self Care | Attending: Urology

## 2023-11-01 ENCOUNTER — Ambulatory Visit (HOSPITAL_BASED_OUTPATIENT_CLINIC_OR_DEPARTMENT_OTHER): Payer: Self-pay | Admitting: Anesthesiology

## 2023-11-01 DIAGNOSIS — N189 Chronic kidney disease, unspecified: Secondary | ICD-10-CM | POA: Insufficient documentation

## 2023-11-01 DIAGNOSIS — Z87442 Personal history of urinary calculi: Secondary | ICD-10-CM | POA: Diagnosis not present

## 2023-11-01 DIAGNOSIS — I129 Hypertensive chronic kidney disease with stage 1 through stage 4 chronic kidney disease, or unspecified chronic kidney disease: Secondary | ICD-10-CM | POA: Insufficient documentation

## 2023-11-01 DIAGNOSIS — N202 Calculus of kidney with calculus of ureter: Secondary | ICD-10-CM

## 2023-11-01 DIAGNOSIS — Z01818 Encounter for other preprocedural examination: Secondary | ICD-10-CM

## 2023-11-01 DIAGNOSIS — E785 Hyperlipidemia, unspecified: Secondary | ICD-10-CM

## 2023-11-01 DIAGNOSIS — N201 Calculus of ureter: Secondary | ICD-10-CM

## 2023-11-01 DIAGNOSIS — N200 Calculus of kidney: Secondary | ICD-10-CM | POA: Diagnosis not present

## 2023-11-01 DIAGNOSIS — I10 Essential (primary) hypertension: Secondary | ICD-10-CM | POA: Diagnosis not present

## 2023-11-01 HISTORY — PX: CYSTOSCOPY/URETEROSCOPY/HOLMIUM LASER/STENT PLACEMENT: SHX6546

## 2023-11-01 HISTORY — DX: Localized edema: R60.0

## 2023-11-01 HISTORY — PX: CYSTOSCOPY W/ RETROGRADES: SHX1426

## 2023-11-01 HISTORY — DX: Other forms of dyspnea: R06.09

## 2023-11-01 LAB — CBC
HCT: 41.2 % (ref 36.0–46.0)
Hemoglobin: 13.3 g/dL (ref 12.0–15.0)
MCH: 30.2 pg (ref 26.0–34.0)
MCHC: 32.3 g/dL (ref 30.0–36.0)
MCV: 93.6 fL (ref 80.0–100.0)
Platelets: 213 K/uL (ref 150–400)
RBC: 4.4 MIL/uL (ref 3.87–5.11)
RDW: 15 % (ref 11.5–15.5)
WBC: 5.4 K/uL (ref 4.0–10.5)
nRBC: 0 % (ref 0.0–0.2)

## 2023-11-01 LAB — BASIC METABOLIC PANEL WITH GFR
Anion gap: 10 (ref 5–15)
BUN: 24 mg/dL — ABNORMAL HIGH (ref 8–23)
CO2: 21 mmol/L — ABNORMAL LOW (ref 22–32)
Calcium: 9.7 mg/dL (ref 8.9–10.3)
Chloride: 105 mmol/L (ref 98–111)
Creatinine, Ser: 1.31 mg/dL — ABNORMAL HIGH (ref 0.44–1.00)
GFR, Estimated: 41 mL/min — ABNORMAL LOW (ref >60)
Glucose, Bld: 110 mg/dL — ABNORMAL HIGH (ref 70–99)
Potassium: 4.5 mmol/L (ref 3.5–5.1)
Sodium: 136 mmol/L (ref 135–145)

## 2023-11-01 SURGERY — CYSTOSCOPY/URETEROSCOPY/HOLMIUM LASER/STENT PLACEMENT
Anesthesia: General | Site: Pelvis | Laterality: Left

## 2023-11-01 MED ORDER — PHENYLEPHRINE 80 MCG/ML (10ML) SYRINGE FOR IV PUSH (FOR BLOOD PRESSURE SUPPORT)
PREFILLED_SYRINGE | INTRAVENOUS | Status: AC
  Filled 2023-11-01: qty 10

## 2023-11-01 MED ORDER — FENTANYL CITRATE (PF) 100 MCG/2ML IJ SOLN
INTRAMUSCULAR | Status: AC
  Filled 2023-11-01: qty 2

## 2023-11-01 MED ORDER — ORAL CARE MOUTH RINSE: 15.0000 mL | Freq: Once | OROMUCOSAL | Status: AC

## 2023-11-01 MED ORDER — DOCUSATE SODIUM 100 MG PO CAPS: 100.0000 mg | ORAL_CAPSULE | Freq: Every day | ORAL | 0 refills | Status: AC | PRN

## 2023-11-01 MED ORDER — ACETAMINOPHEN 500 MG PO TABS
1000.0000 mg | ORAL_TABLET | Freq: Once | ORAL | Status: AC
  Administered 2023-11-01: 1000 mg via ORAL
  Filled 2023-11-01: qty 2

## 2023-11-01 MED ORDER — FENTANYL CITRATE PF 50 MCG/ML IJ SOSY: 25.0000 ug | PREFILLED_SYRINGE | INTRAMUSCULAR | Status: DC | PRN

## 2023-11-01 MED ORDER — PROPOFOL 10 MG/ML IV BOLUS
INTRAVENOUS | Status: AC
  Filled 2023-11-01: qty 20

## 2023-11-01 MED ORDER — LIDOCAINE HCL (CARDIAC) PF 100 MG/5ML IV SOSY
PREFILLED_SYRINGE | INTRAVENOUS | Status: DC | PRN
  Administered 2023-11-01: 60 mg via INTRAVENOUS

## 2023-11-01 MED ORDER — OXYCODONE HCL 5 MG PO TABS: 5.0000 mg | ORAL_TABLET | Freq: Once | ORAL | Status: DC | PRN

## 2023-11-01 MED ORDER — GENTAMICIN SULFATE 40 MG/ML IJ SOLN
380.0000 mg | INTRAVENOUS | Status: AC
  Administered 2023-11-01: 380 mg via INTRAVENOUS
  Filled 2023-11-01: qty 9.5

## 2023-11-01 MED ORDER — AMISULPRIDE (ANTIEMETIC) 5 MG/2ML IV SOLN: 10.0000 mg | Freq: Once | INTRAVENOUS | Status: DC | PRN

## 2023-11-01 MED ORDER — DEXAMETHASONE SODIUM PHOSPHATE 10 MG/ML IJ SOLN
INTRAMUSCULAR | Status: AC
  Filled 2023-11-01: qty 1

## 2023-11-01 MED ORDER — ONDANSETRON HCL 4 MG/2ML IJ SOLN
INTRAMUSCULAR | Status: DC | PRN
  Administered 2023-11-01: 4 mg via INTRAVENOUS

## 2023-11-01 MED ORDER — PROPOFOL 10 MG/ML IV BOLUS
INTRAVENOUS | Status: DC | PRN
  Administered 2023-11-01: 150 mg via INTRAVENOUS

## 2023-11-01 MED ORDER — CHLORHEXIDINE GLUCONATE 0.12 % MT SOLN
15.0000 mL | Freq: Once | OROMUCOSAL | Status: AC
  Administered 2023-11-01: 15 mL via OROMUCOSAL

## 2023-11-01 MED ORDER — ONDANSETRON HCL 4 MG/2ML IJ SOLN
INTRAMUSCULAR | Status: AC
  Filled 2023-11-01: qty 2

## 2023-11-01 MED ORDER — LACTATED RINGERS IV SOLN: INTRAVENOUS | Status: DC

## 2023-11-01 MED ORDER — EPHEDRINE 5 MG/ML INJ
INTRAVENOUS | Status: AC
  Filled 2023-11-01: qty 5

## 2023-11-01 MED ORDER — LIDOCAINE HCL (PF) 2 % IJ SOLN
INTRAMUSCULAR | Status: AC
  Filled 2023-11-01: qty 5

## 2023-11-01 MED ORDER — SODIUM CHLORIDE 0.9 % IR SOLN
Status: DC | PRN
  Administered 2023-11-01: 3000 mL via INTRAVESICAL

## 2023-11-01 MED ORDER — IOHEXOL 300 MG/ML  SOLN
INTRAMUSCULAR | Status: DC | PRN
  Administered 2023-11-01: 8 mL via URETHRAL

## 2023-11-01 MED ORDER — OXYCODONE HCL 5 MG/5ML PO SOLN: 5.0000 mg | Freq: Once | ORAL | Status: DC | PRN

## 2023-11-01 MED ORDER — PHENYLEPHRINE 80 MCG/ML (10ML) SYRINGE FOR IV PUSH (FOR BLOOD PRESSURE SUPPORT)
PREFILLED_SYRINGE | INTRAVENOUS | Status: DC | PRN
  Administered 2023-11-01: 80 ug via INTRAVENOUS

## 2023-11-01 MED ORDER — KETOROLAC TROMETHAMINE 30 MG/ML IJ SOLN
INTRAMUSCULAR | Status: AC
  Filled 2023-11-01: qty 1

## 2023-11-01 MED ORDER — EPHEDRINE SULFATE-NACL 50-0.9 MG/10ML-% IV SOSY
PREFILLED_SYRINGE | INTRAVENOUS | Status: DC | PRN
Start: 2023-11-01 — End: 2023-11-01
  Administered 2023-11-01 (×3): 5 mg via INTRAVENOUS

## 2023-11-01 MED ORDER — FENTANYL CITRATE (PF) 100 MCG/2ML IJ SOLN
INTRAMUSCULAR | Status: DC | PRN
  Administered 2023-11-01: 25 ug via INTRAVENOUS

## 2023-11-01 MED ORDER — IOPAMIDOL (ISOVUE-300) INJECTION 61%
INTRAVENOUS | Status: DC | PRN
Start: 2023-11-01 — End: 2023-11-01

## 2023-11-01 MED ORDER — OXYCODONE-ACETAMINOPHEN 5-325 MG PO TABS: 1.0000 | ORAL_TABLET | ORAL | 0 refills | Status: AC | PRN

## 2023-11-01 MED ORDER — DEXAMETHASONE SODIUM PHOSPHATE 10 MG/ML IJ SOLN
INTRAMUSCULAR | Status: DC | PRN
  Administered 2023-11-01: 10 mg via INTRAVENOUS

## 2023-11-01 SURGICAL SUPPLY — 24 items
BAG URO CATCHER STRL LF (MISCELLANEOUS) ×1 IMPLANT
BASKET ZERO TIP NITINOL 2.4FR (BASKET) IMPLANT
BENZOIN TINCTURE PRP APPL 2/3 (GAUZE/BANDAGES/DRESSINGS) IMPLANT
CATH URETERAL DUAL LUMEN 10F (MISCELLANEOUS) IMPLANT
CATH URETL OPEN 5X70 (CATHETERS) ×1 IMPLANT
CATH URETL OPEN END 6FR 70 (CATHETERS) IMPLANT
CLOTH BEACON ORANGE TIMEOUT ST (SAFETY) ×1 IMPLANT
DRSG TEGADERM 2-3/8X2-3/4 SM (GAUZE/BANDAGES/DRESSINGS) IMPLANT
FIBER LASER MOSES 200 DFL (Laser) IMPLANT
GLOVE BIOGEL M 7.0 STRL (GLOVE) ×1 IMPLANT
GOWN STRL REUS W/ TWL XL LVL3 (GOWN DISPOSABLE) ×1 IMPLANT
GUIDEWIRE STR DUAL SENSOR (WIRE) ×2 IMPLANT
GUIDEWIRE ZIPWRE .038 STRAIGHT (WIRE) IMPLANT
KIT TURNOVER KIT A (KITS) ×1 IMPLANT
MANIFOLD NEPTUNE II (INSTRUMENTS) ×1 IMPLANT
NS IRRIG 1000ML POUR BTL (IV SOLUTION) IMPLANT
PACK CYSTO (CUSTOM PROCEDURE TRAY) ×1 IMPLANT
PAD PREP 24X48 CUFFED NSTRL (MISCELLANEOUS) ×1 IMPLANT
SHEATH DILATOR SET 8/10 (MISCELLANEOUS) IMPLANT
SHEATH NAVIGATOR HD 12/14X46 (SHEATH) IMPLANT
STENT URET 6FRX26 CONTOUR (STENTS) IMPLANT
TRACTIP FLEXIVA PULS ID 200XHI (Laser) IMPLANT
TUBING CONNECTING 10 (TUBING) ×1 IMPLANT
TUBING UROLOGY SET (TUBING) ×1 IMPLANT

## 2023-11-01 NOTE — Transfer of Care (Signed)
 Immediate Anesthesia Transfer of Care Note  Patient: Rebecca Porter  Procedure(s) Performed: CYSTOSCOPY/URETEROSCOPY/HOLMIUM LASER/STENT PLACEMENT (Left: Pelvis) CYSTOSCOPY, WITH RETROGRADE PYELOGRAM (Left: Pelvis)  Patient Location: PACU  Anesthesia Type:General  Level of Consciousness: awake, alert , and oriented  Airway & Oxygen Therapy: Patient Spontanous Breathing and Patient connected to face mask oxygen  Post-op Assessment: Report given to RN and Post -op Vital signs reviewed and stable  Post vital signs: Reviewed and stable  Last Vitals:  Vitals Value Taken Time  BP    Temp    Pulse 70 11/01/23 13:14  Resp 17 11/01/23 13:14  SpO2 92 % 11/01/23 13:14  Vitals shown include unfiled device data.  Last Pain:  Vitals:   11/01/23 1020  TempSrc: Oral  PainSc:       Patients Stated Pain Goal: 4 (11/01/23 1100)  Complications: No notable events documented.

## 2023-11-01 NOTE — Anesthesia Procedure Notes (Signed)
 Procedure Name: LMA Insertion Date/Time: 11/01/2023 12:10 PM  Performed by: Therisa Doyal CROME, CRNAPatient Re-evaluated:Patient Re-evaluated prior to induction Oxygen Delivery Method: Circle system utilized Preoxygenation: Pre-oxygenation with 100% oxygen Induction Type: IV induction LMA: LMA inserted LMA Size: 4.0 Number of attempts: 1 Placement Confirmation: positive ETCO2 and breath sounds checked- equal and bilateral Tube secured with: Tape Dental Injury: Teeth and Oropharynx as per pre-operative assessment

## 2023-11-01 NOTE — Op Note (Signed)
 Operative Note  Preoperative diagnosis:  1.  Left ureteral stone  Postoperative diagnosis: 1.  Left renal stones  Procedure(s): 1.  Cystoscopy 2. Left ureteroscopy with laser lithotripsy and basket extraction of stones 3. Left retrograde pyelogram 4. Left ureteral stent placement 5. Fluoroscopy with intraoperative interpretation  Surgeon: Donnice Siad, MD  Assistants:  None  Anesthesia:  General  Complications:  None  EBL:  Minimal  Specimens: 1. Stones for stone analysis (to be done at [facility])  Drains/Catheters: 1.  Left 6Fr x 24cm ureteral stent with a tether string  Intraoperative findings:   Cystoscopy demonstrated no suspicious lesions. Left retrograde pyelogram with no hydronephrosis, extravasation of contrast or filling defects. Left ureteroscopy demonstrated 2 separate 4mm stones in the left kidney each fragmented and basket extracted. Successful stent placement.  Indication:  Rebecca Porter is a 83 y.o. female with a history of a left UPJ stone here for definitive management.   Description of procedure: After informed consent was obtained from the patient, the patient was identified and taken to the operating room and placed in the supine position.  General anesthesia was administered as well as perioperative IV antibiotics.  At the beginning of the case, a time-out was performed to properly identify the patient, the surgery to be performed, and the surgical site.  Sequential compression devices were applied to the lower extremities at the beginning of the case for DVT prophylaxis.  The patient was then placed in the dorsal lithotomy supine position, prepped and draped in sterile fashion.  We then passed the 21-French rigid cystoscope through the urethra and into the bladder under vision without any difficulty, noting a normal urethra without strictures. A systematic evaluation of the bladder revealed no evidence of any suspicious bladder lesions.   Ureteral orifices were in normal position.    Under cystoscopic and flouroscopic guidance, we cannulated the left ureteral orifice with a 5-French open-ended ureteral catheter and a gentle retrograde pyelogram was performed, revealing a normal caliber ureter without any filling defects. There was no hydronephrosis of the collecting system. A 0.038 sensor wire was then passed up to the level of the renal pelvis and secured to the drape as a safety wire. The ureteral catheter and cystoscope were removed, leaving the safety wire in place.   A semi-rigid ureteroscope was passed alongside the wire up the distal ureter which appeared normal. A second 0.038 sensor wire was passed under direct vision and the semirigid scope was removed. The flexible ureteroscope was advanced into the collecting system. The collecting system was inspected. The calculus was identified at the left upper pole. There were 2 separate stones each about 4mm. Using the 272 micron holmium laser fiber, the stone was fragmented completely. A 2.2 Fr zero tip basket was used to remove the fragments under visual guidance. These were sent for chemical analysis. With the ureteroscope in the kidney, a gentle pyelogram was performed to delineate the calyceal system and we evaluated the calyces systematically. We encountered no further stones. The rest of the stone fragments were very tiny and these were  irrigated away gently. The calyces were re-inspected and there were no significant stone fragment residual.   We then withdrew the ureteroscope back down the ureter noting no evidence of any stones along the course of the ureter.  Prior to removing the ureteroscope, we did pass the Glidewire back up to the ureter to the renal pelvis.  Once the ureteroscope was removed, we then used the Glidewire under fluoroscopic  guidance and passed up a 6-French x 24 cm double-pigtail ureteral stent up the ureter, making sure that the proximal and distal ends coiled  within the kidney and bladder respectively.  Note that we left a long tether string attached to the distal end of the ureteral stent and it exited the urethral meatus and was secured to the inner thigh with a tegaderm adhesive.  The cystoscope was then advanced back into the bladder under vision.  We were able to see the distal stent coiling nicely within the bladder.  The bladder was then emptied with irrigation solution.  The cystoscope was then removed.    The patient tolerated the procedure well and there was no complication. Patient was awoken from anesthesia and taken to the recovery room in stable condition. I was present and scrubbed for the entirety of the case.  Plan:  Patient will be discharged home. She will remove her stent in 3 days.  Matt R. Mckale Haffey MD [facility]  Pager: [phone]

## 2023-11-01 NOTE — Anesthesia Postprocedure Evaluation (Signed)
 Anesthesia Post Note  Patient: Rebecca Porter  Procedure(s) Performed: CYSTOSCOPY/URETEROSCOPY/HOLMIUM LASER/STENT PLACEMENT (Left: Pelvis) CYSTOSCOPY, WITH RETROGRADE PYELOGRAM (Left: Pelvis)     Patient location during evaluation: PACU Anesthesia Type: General Level of consciousness: awake and alert Pain management: pain level controlled Vital Signs Assessment: post-procedure vital signs reviewed and stable Respiratory status: spontaneous breathing, nonlabored ventilation, respiratory function stable and patient connected to nasal cannula oxygen Cardiovascular status: blood pressure returned to baseline and stable Postop Assessment: no apparent nausea or vomiting Anesthetic complications: no   No notable events documented.  Last Vitals:  Vitals:   11/01/23 1345 11/01/23 1400  BP: 136/87 (!) 150/82  Pulse: 75 68  Resp: 18   Temp: 36.4 C   SpO2: 94% 96%    Last Pain:  Vitals:   11/01/23 1400  TempSrc:   PainSc: 0-No pain                 Billiejean Schimek L Raymond Azure

## 2023-11-01 NOTE — Discharge Instructions (Addendum)
 [facility] [phone] Post Ureteroscopy With or Without Stent Instructions  Definitions:  Ureter: The duct that transports urine from the kidney to the bladder. Stent:   A plastic hollow tube that is placed into the ureter, from the kidney to the bladder to prevent the ureter from swelling shut.  GENERAL INSTRUCTIONS:  Despite the fact that no skin incisions were used, the area around the ureter and bladder is raw and irritated. The stent is a foreign body which will further irritate the bladder wall. This irritation is manifested by increased frequency of urination, both day and night, and by an increase in the urge to urinate. In some, the urge to urinate is present almost always. Sometimes the urge is strong enough that you may not be able to stop yourself from urinating. The only real cure is to remove the stent and then give time for the bladder wall to heal which can't be done until the danger of the ureter swelling shut has passed, which varies.  You may see some blood in your urine while the stent is in place and a few days afterwards. Do not be alarmed, even if the urine was clear for a while. Get off your feet and drink lots of fluids until clearing occurs. If you start to pass clots or don't improve, call us .  DIET: You may return to your normal diet immediately. Because of the raw surface of your bladder, alcohol, spicy foods, acid type foods and drinks with caffeine may cause irritation or frequency and should be used in moderation. To keep your urine flowing freely and to avoid constipation, drink plenty of fluids during the day ( 8-10 glasses ). Tip: Avoid cranberry juice because it is very acidic.  ACTIVITY: Your physical activity doesn't need to be restricted. However, if you are very active, you may see some blood in your urine. We suggest that you reduce your activity under these circumstances until the bleeding has stopped.  BOWELS: It is important to  keep your bowels regular during the postoperative period. Straining with bowel movements can cause bleeding. A bowel movement every other day is reasonable. Use a mild laxative if needed, such as Milk of Magnesia 2-3 tablespoons, or 2 Dulcolax tablets. Call if you continue to have problems. If you have been taking narcotics for pain, before, during or after your surgery, you may be constipated. Take a laxative if necessary.   MEDICATION: You should resume your pre-surgery medications unless told not to. In addition you will often be given an antibiotic to prevent infection. These should be taken as prescribed until the bottles are finished unless you are having an unusual reaction to one of the drugs.  PROBLEMS YOU SHOULD REPORT TO US : Fevers over 100.5 Fahrenheit. Heavy bleeding, or clots ( See above notes about blood in urine ). Inability to urinate. Drug reactions ( hives, rash, nausea, vomiting, diarrhea ). Severe burning or pain with urination that is not improving.  FOLLOW-UP: You will need a follow-up appointment to monitor your progress. Call for this appointment at the number listed above. Usually the first appointment will be about three to fourteen days after your surgery.  You may remove your stent on Thursday AM by pulling on attached string.

## 2023-11-02 ENCOUNTER — Encounter (HOSPITAL_COMMUNITY): Payer: Self-pay | Admitting: Urology

## 2023-11-02 DIAGNOSIS — N201 Calculus of ureter: Secondary | ICD-10-CM | POA: Diagnosis not present

## 2023-11-05 DIAGNOSIS — N201 Calculus of ureter: Secondary | ICD-10-CM | POA: Diagnosis not present

## 2023-11-11 DIAGNOSIS — E785 Hyperlipidemia, unspecified: Secondary | ICD-10-CM | POA: Diagnosis not present

## 2023-11-11 DIAGNOSIS — F322 Major depressive disorder, single episode, severe without psychotic features: Secondary | ICD-10-CM | POA: Diagnosis not present

## 2023-11-22 ENCOUNTER — Ambulatory Visit (HOSPITAL_COMMUNITY)
Admission: RE | Admit: 2023-11-22 | Discharge: 2023-11-22 | Disposition: A | Discharge status: Home / Self Care | Source: Ambulatory Visit | Attending: Cardiology | Admitting: Cardiology

## 2023-11-22 DIAGNOSIS — R0609 Other forms of dyspnea: Secondary | ICD-10-CM | POA: Diagnosis not present

## 2023-11-22 DIAGNOSIS — I10 Essential (primary) hypertension: Secondary | ICD-10-CM | POA: Diagnosis not present

## 2023-11-22 DIAGNOSIS — I340 Nonrheumatic mitral (valve) insufficiency: Secondary | ICD-10-CM | POA: Diagnosis not present

## 2023-11-22 LAB — ECHOCARDIOGRAM COMPLETE
Est EF: 50
S' Lateral: 3.59 cm

## 2023-11-29 ENCOUNTER — Ambulatory Visit: Payer: Self-pay | Admitting: Cardiology

## 2023-11-29 NOTE — Progress Notes (Signed)
 Heart function mildly reduced compared to echo previously in 2022. Probably related to underlying LBBB. To discuss on office visit if there is change in therapy needed

## 2023-12-07 NOTE — Telephone Encounter (Signed)
 Letter of results sent to pt

## 2023-12-12 DIAGNOSIS — F322 Major depressive disorder, single episode, severe without psychotic features: Secondary | ICD-10-CM | POA: Diagnosis not present

## 2023-12-12 DIAGNOSIS — E785 Hyperlipidemia, unspecified: Secondary | ICD-10-CM | POA: Diagnosis not present

## 2023-12-15 DIAGNOSIS — N201 Calculus of ureter: Secondary | ICD-10-CM | POA: Diagnosis not present

## 2023-12-15 DIAGNOSIS — N3000 Acute cystitis without hematuria: Secondary | ICD-10-CM | POA: Diagnosis not present

## 2023-12-15 DIAGNOSIS — R8271 Bacteriuria: Secondary | ICD-10-CM | POA: Diagnosis not present

## 2023-12-15 DIAGNOSIS — N3941 Urge incontinence: Secondary | ICD-10-CM | POA: Diagnosis not present

## 2023-12-17 NOTE — Telephone Encounter (Signed)
 Patient's daughter is calling back. They received the letter for echo result and she would like to know if the patient need to follow up with new cardiologist Dr. Lonni or just wait for her yearly follow up

## 2023-12-21 NOTE — Telephone Encounter (Signed)
 Called left message on patient's daughter  mobile phone.  Patient does not need a n appointment until 6 /2026 with Dr Lonni. Any question may call back

## 2023-12-27 DIAGNOSIS — H353133 Nonexudative age-related macular degeneration, bilateral, advanced atrophic without subfoveal involvement: Secondary | ICD-10-CM | POA: Diagnosis not present

## 2023-12-27 DIAGNOSIS — H524 Presbyopia: Secondary | ICD-10-CM | POA: Diagnosis not present

## 2023-12-27 DIAGNOSIS — Z961 Presence of intraocular lens: Secondary | ICD-10-CM | POA: Diagnosis not present

## 2023-12-27 DIAGNOSIS — H40013 Open angle with borderline findings, low risk, bilateral: Secondary | ICD-10-CM | POA: Diagnosis not present

## 2023-12-27 DIAGNOSIS — H52203 Unspecified astigmatism, bilateral: Secondary | ICD-10-CM | POA: Diagnosis not present

## 2024-01-11 DIAGNOSIS — F322 Major depressive disorder, single episode, severe without psychotic features: Secondary | ICD-10-CM | POA: Diagnosis not present

## 2024-01-11 DIAGNOSIS — E785 Hyperlipidemia, unspecified: Secondary | ICD-10-CM | POA: Diagnosis not present

## 2024-02-03 DIAGNOSIS — H40013 Open angle with borderline findings, low risk, bilateral: Secondary | ICD-10-CM | POA: Diagnosis not present

## 2024-02-11 DIAGNOSIS — F322 Major depressive disorder, single episode, severe without psychotic features: Secondary | ICD-10-CM | POA: Diagnosis not present

## 2024-02-11 DIAGNOSIS — E785 Hyperlipidemia, unspecified: Secondary | ICD-10-CM | POA: Diagnosis not present

## 2024-03-03 DIAGNOSIS — R21 Rash and other nonspecific skin eruption: Secondary | ICD-10-CM | POA: Diagnosis not present

## 2024-03-12 DIAGNOSIS — F322 Major depressive disorder, single episode, severe without psychotic features: Secondary | ICD-10-CM | POA: Diagnosis not present

## 2024-03-12 DIAGNOSIS — E785 Hyperlipidemia, unspecified: Secondary | ICD-10-CM | POA: Diagnosis not present
